# Patient Record
Sex: Female | Born: 1945
Health system: Southern US, Community
[De-identification: ages and names within clinical notes are randomized; demographics above are authoritative.]

## PROBLEM LIST (undated history)

## (undated) DIAGNOSIS — I2510 Atherosclerotic heart disease of native coronary artery without angina pectoris: Secondary | ICD-10-CM

## (undated) DIAGNOSIS — I358 Other nonrheumatic aortic valve disorders: Secondary | ICD-10-CM

## (undated) DIAGNOSIS — I071 Rheumatic tricuspid insufficiency: Secondary | ICD-10-CM

## (undated) DIAGNOSIS — Z950 Presence of cardiac pacemaker: Secondary | ICD-10-CM

## (undated) DIAGNOSIS — H269 Unspecified cataract: Secondary | ICD-10-CM

## (undated) DIAGNOSIS — R0602 Shortness of breath: Secondary | ICD-10-CM

## (undated) DIAGNOSIS — I441 Atrioventricular block, second degree: Secondary | ICD-10-CM

## (undated) DIAGNOSIS — E039 Hypothyroidism, unspecified: Secondary | ICD-10-CM

## (undated) DIAGNOSIS — E785 Hyperlipidemia, unspecified: Secondary | ICD-10-CM

## (undated) DIAGNOSIS — Z5189 Encounter for other specified aftercare: Secondary | ICD-10-CM

## (undated) DIAGNOSIS — I351 Nonrheumatic aortic (valve) insufficiency: Secondary | ICD-10-CM

## (undated) DIAGNOSIS — J40 Bronchitis, not specified as acute or chronic: Secondary | ICD-10-CM

## (undated) DIAGNOSIS — M8580 Other specified disorders of bone density and structure, unspecified site: Secondary | ICD-10-CM

## (undated) HISTORY — DX: Shortness of breath: R06.02

## (undated) HISTORY — DX: Atrioventricular block, second degree: I44.1

## (undated) HISTORY — DX: Atherosclerotic heart disease of native coronary artery without angina pectoris: I25.10

## (undated) HISTORY — PX: PACEMAKER INSERTION: SHX728

## (undated) HISTORY — DX: Unspecified cataract: H26.9

## (undated) HISTORY — DX: Rheumatic tricuspid insufficiency: I07.1

## (undated) HISTORY — DX: Nonrheumatic aortic (valve) insufficiency: I35.1

## (undated) HISTORY — DX: Hyperlipidemia, unspecified: E78.5

## (undated) HISTORY — DX: Bronchitis, not specified as acute or chronic: J40

## (undated) HISTORY — DX: Other specified disorders of bone density and structure, unspecified site: M85.80

## (undated) HISTORY — DX: Presence of cardiac pacemaker: Z95.0

## (undated) HISTORY — PX: MYOMECTOMY: SHX85

## (undated) HISTORY — DX: Encounter for other specified aftercare: Z51.89

## (undated) HISTORY — DX: Hypothyroidism, unspecified: E03.9

## (undated) HISTORY — DX: Other nonrheumatic aortic valve disorders: I35.8

## (undated) HISTORY — PX: CARDIAC CATHETERIZATION: SHX172

## (undated) HISTORY — PX: TOTAL ABDOMINAL HYSTERECTOMY W/ BILATERAL SALPINGOOPHORECTOMY: SHX83

---

## 1998-10-18 ENCOUNTER — Other Ambulatory Visit: Admission: RE | Admit: 1998-10-18 | Discharge: 1998-10-18 | Payer: Self-pay | Admitting: Obstetrics and Gynecology

## 1999-11-07 ENCOUNTER — Encounter: Admission: RE | Admit: 1999-11-07 | Discharge: 1999-11-07 | Payer: Self-pay | Admitting: Obstetrics and Gynecology

## 1999-11-07 ENCOUNTER — Encounter: Payer: Self-pay | Admitting: Obstetrics and Gynecology

## 2000-03-06 ENCOUNTER — Other Ambulatory Visit: Admission: RE | Admit: 2000-03-06 | Discharge: 2000-03-06 | Payer: Self-pay | Admitting: Obstetrics and Gynecology

## 2000-09-03 ENCOUNTER — Encounter: Payer: Self-pay | Admitting: Family Medicine

## 2000-09-03 ENCOUNTER — Encounter: Admission: RE | Admit: 2000-09-03 | Discharge: 2000-09-03 | Payer: Self-pay | Admitting: Family Medicine

## 2000-11-13 ENCOUNTER — Encounter: Admission: RE | Admit: 2000-11-13 | Discharge: 2000-11-13 | Payer: Self-pay | Admitting: Family Medicine

## 2000-11-13 ENCOUNTER — Encounter: Payer: Self-pay | Admitting: Family Medicine

## 2001-03-20 ENCOUNTER — Other Ambulatory Visit: Admission: RE | Admit: 2001-03-20 | Discharge: 2001-03-20 | Payer: Self-pay | Admitting: Obstetrics and Gynecology

## 2001-04-25 ENCOUNTER — Encounter: Payer: Self-pay | Admitting: Family Medicine

## 2001-04-25 ENCOUNTER — Encounter: Admission: RE | Admit: 2001-04-25 | Discharge: 2001-04-25 | Payer: Self-pay | Admitting: Family Medicine

## 2001-04-29 ENCOUNTER — Encounter: Payer: Self-pay | Admitting: Family Medicine

## 2001-04-29 ENCOUNTER — Encounter: Admission: RE | Admit: 2001-04-29 | Discharge: 2001-04-29 | Payer: Self-pay | Admitting: Family Medicine

## 2001-08-01 ENCOUNTER — Encounter: Admission: RE | Admit: 2001-08-01 | Discharge: 2001-08-01 | Payer: Self-pay | Admitting: Family Medicine

## 2001-08-01 ENCOUNTER — Encounter: Payer: Self-pay | Admitting: Family Medicine

## 2001-08-27 ENCOUNTER — Ambulatory Visit (HOSPITAL_COMMUNITY): Admission: RE | Admit: 2001-08-27 | Discharge: 2001-08-28 | Payer: Self-pay | Admitting: Cardiology

## 2001-09-06 ENCOUNTER — Encounter: Admission: RE | Admit: 2001-09-06 | Discharge: 2001-12-05 | Payer: Self-pay | Admitting: Cardiology

## 2001-09-10 ENCOUNTER — Encounter (HOSPITAL_COMMUNITY): Admission: RE | Admit: 2001-09-10 | Discharge: 2001-10-25 | Payer: Self-pay | Admitting: Cardiology

## 2001-11-14 ENCOUNTER — Encounter: Admission: RE | Admit: 2001-11-14 | Discharge: 2001-11-14 | Payer: Self-pay | Admitting: Family Medicine

## 2001-11-14 ENCOUNTER — Encounter: Payer: Self-pay | Admitting: Family Medicine

## 2002-01-02 ENCOUNTER — Encounter (INDEPENDENT_AMBULATORY_CARE_PROVIDER_SITE_OTHER): Payer: Self-pay | Admitting: Specialist

## 2002-01-02 ENCOUNTER — Ambulatory Visit (HOSPITAL_COMMUNITY): Admission: RE | Admit: 2002-01-02 | Discharge: 2002-01-02 | Payer: Self-pay | Admitting: Gastroenterology

## 2002-02-03 ENCOUNTER — Encounter: Admission: RE | Admit: 2002-02-03 | Discharge: 2002-05-04 | Payer: Self-pay | Admitting: Cardiology

## 2003-03-24 ENCOUNTER — Ambulatory Visit (HOSPITAL_COMMUNITY): Admission: RE | Admit: 2003-03-24 | Discharge: 2003-03-25 | Payer: Self-pay | Admitting: Cardiology

## 2003-03-24 ENCOUNTER — Encounter: Payer: Self-pay | Admitting: Cardiology

## 2003-03-25 ENCOUNTER — Encounter: Payer: Self-pay | Admitting: Cardiology

## 2004-07-19 ENCOUNTER — Other Ambulatory Visit: Admission: RE | Admit: 2004-07-19 | Discharge: 2004-07-19 | Payer: Self-pay | Admitting: Obstetrics and Gynecology

## 2004-08-16 ENCOUNTER — Ambulatory Visit: Payer: Self-pay

## 2004-11-16 ENCOUNTER — Ambulatory Visit: Payer: Self-pay | Admitting: Cardiology

## 2004-12-02 ENCOUNTER — Ambulatory Visit: Payer: Self-pay | Admitting: Cardiology

## 2004-12-02 ENCOUNTER — Ambulatory Visit: Payer: Self-pay

## 2005-06-01 ENCOUNTER — Ambulatory Visit: Payer: Self-pay | Admitting: Cardiology

## 2005-08-30 ENCOUNTER — Ambulatory Visit: Payer: Self-pay | Admitting: Cardiology

## 2005-11-23 ENCOUNTER — Other Ambulatory Visit: Admission: RE | Admit: 2005-11-23 | Discharge: 2005-11-23 | Payer: Self-pay | Admitting: Obstetrics and Gynecology

## 2005-11-27 ENCOUNTER — Ambulatory Visit: Payer: Self-pay | Admitting: Cardiology

## 2006-02-28 ENCOUNTER — Ambulatory Visit: Payer: Self-pay | Admitting: Cardiology

## 2006-06-11 ENCOUNTER — Ambulatory Visit: Payer: Self-pay | Admitting: Cardiology

## 2006-09-05 ENCOUNTER — Ambulatory Visit: Payer: Self-pay | Admitting: Cardiology

## 2006-12-04 ENCOUNTER — Ambulatory Visit: Payer: Self-pay | Admitting: Cardiology

## 2007-04-30 ENCOUNTER — Ambulatory Visit: Payer: Self-pay | Admitting: Internal Medicine

## 2007-07-31 ENCOUNTER — Ambulatory Visit: Payer: Self-pay | Admitting: Cardiology

## 2007-11-27 ENCOUNTER — Ambulatory Visit: Payer: Self-pay | Admitting: Cardiology

## 2008-02-26 ENCOUNTER — Ambulatory Visit: Payer: Self-pay | Admitting: Cardiology

## 2008-05-28 ENCOUNTER — Ambulatory Visit: Payer: Self-pay | Admitting: Cardiology

## 2008-08-03 ENCOUNTER — Ambulatory Visit: Payer: Self-pay | Admitting: Internal Medicine

## 2008-08-03 DIAGNOSIS — E785 Hyperlipidemia, unspecified: Secondary | ICD-10-CM

## 2008-08-03 DIAGNOSIS — I2510 Atherosclerotic heart disease of native coronary artery without angina pectoris: Secondary | ICD-10-CM

## 2008-08-03 DIAGNOSIS — E039 Hypothyroidism, unspecified: Secondary | ICD-10-CM

## 2008-08-06 ENCOUNTER — Ambulatory Visit: Payer: Self-pay | Admitting: Obstetrics and Gynecology

## 2008-08-06 ENCOUNTER — Encounter: Payer: Self-pay | Admitting: Obstetrics and Gynecology

## 2008-08-06 ENCOUNTER — Other Ambulatory Visit: Admission: RE | Admit: 2008-08-06 | Discharge: 2008-08-06 | Payer: Self-pay | Admitting: Obstetrics and Gynecology

## 2008-08-26 ENCOUNTER — Ambulatory Visit: Payer: Self-pay | Admitting: Cardiology

## 2008-10-07 ENCOUNTER — Ambulatory Visit: Payer: Self-pay | Admitting: Internal Medicine

## 2008-10-07 LAB — CONVERTED CEMR LAB
Albumin: 4.3 g/dL (ref 3.5–5.2)
Alkaline Phosphatase: 73 units/L (ref 39–117)
BUN: 16 mg/dL (ref 6–23)
Bilirubin, Direct: 0.1 mg/dL (ref 0.0–0.3)
Chloride: 105 meq/L (ref 96–112)
GFR calc non Af Amer: 77.19 mL/min (ref >60)
Glucose, Bld: 88 mg/dL (ref 70–99)
Potassium: 4.1 meq/L (ref 3.5–5.1)
TSH: 2.34 microintl units/mL (ref 0.35–5.50)
Total Bilirubin: 1.2 mg/dL (ref 0.3–1.2)
Total CHOL/HDL Ratio: 3
Vit D, 1,25-Dihydroxy: 41 (ref 30–89)

## 2008-10-11 ENCOUNTER — Encounter: Payer: Self-pay | Admitting: Internal Medicine

## 2008-10-12 ENCOUNTER — Ambulatory Visit (HOSPITAL_BASED_OUTPATIENT_CLINIC_OR_DEPARTMENT_OTHER): Admission: RE | Admit: 2008-10-12 | Discharge: 2008-10-12 | Payer: Self-pay | Admitting: Internal Medicine

## 2008-10-12 ENCOUNTER — Ambulatory Visit: Payer: Self-pay | Admitting: Diagnostic Radiology

## 2008-10-12 ENCOUNTER — Ambulatory Visit: Payer: Self-pay | Admitting: Internal Medicine

## 2008-10-12 ENCOUNTER — Telehealth: Payer: Self-pay | Admitting: Internal Medicine

## 2008-10-12 DIAGNOSIS — R509 Fever, unspecified: Secondary | ICD-10-CM

## 2008-10-12 LAB — CONVERTED CEMR LAB
Basophils Absolute: 0 10*3/uL (ref 0.0–0.1)
Basophils Relative: 0.2 % (ref 0.0–3.0)
Eosinophils Absolute: 0.1 10*3/uL (ref 0.0–0.7)
HCT: 41 % (ref 36.0–46.0)
Hemoglobin: 14 g/dL (ref 12.0–15.0)
Lymphocytes Relative: 7.1 % — ABNORMAL LOW (ref 12.0–46.0)
Lymphs Abs: 0.3 10*3/uL — ABNORMAL LOW (ref 0.7–4.0)
MCHC: 34.2 g/dL (ref 30.0–36.0)
MCV: 88.4 fL (ref 78.0–100.0)
Monocytes Absolute: 0.3 10*3/uL (ref 0.1–1.0)
Neutrophils Relative %: 84.5 % — ABNORMAL HIGH (ref 43.0–77.0)
RBC: 4.64 M/uL (ref 3.87–5.11)
RDW: 12.5 % (ref 11.5–14.6)
WBC: 4.1 10*3/uL — ABNORMAL LOW (ref 4.5–10.5)

## 2008-10-13 ENCOUNTER — Encounter: Payer: Self-pay | Admitting: Internal Medicine

## 2008-10-13 LAB — CONVERTED CEMR LAB: LDH: 446 units/L — ABNORMAL HIGH (ref 94–250)

## 2008-10-28 ENCOUNTER — Ambulatory Visit: Payer: Self-pay | Admitting: Internal Medicine

## 2008-10-28 LAB — CONVERTED CEMR LAB
Basophils Absolute: 0.1 10*3/uL (ref 0.0–0.1)
Basophils Relative: 1 % (ref 0–1)
Eosinophils Absolute: 0.2 10*3/uL (ref 0.0–0.7)
Eosinophils Relative: 4 % (ref 0–5)
LDH: 216 units/L (ref 94–250)
MCV: 88.4 fL (ref 78.0–100.0)
Monocytes Absolute: 0.6 10*3/uL (ref 0.1–1.0)
Neutro Abs: 3.4 10*3/uL (ref 1.7–7.7)
Neutrophils Relative %: 60 % (ref 43–77)
WBC: 5.7 10*3/uL (ref 4.0–10.5)

## 2008-11-06 ENCOUNTER — Encounter (INDEPENDENT_AMBULATORY_CARE_PROVIDER_SITE_OTHER): Payer: Self-pay | Admitting: Radiology

## 2008-12-01 DIAGNOSIS — Z950 Presence of cardiac pacemaker: Secondary | ICD-10-CM | POA: Insufficient documentation

## 2008-12-02 ENCOUNTER — Ambulatory Visit: Payer: Self-pay | Admitting: Cardiology

## 2008-12-24 ENCOUNTER — Encounter: Payer: Self-pay | Admitting: Cardiology

## 2009-03-03 ENCOUNTER — Encounter: Payer: Self-pay | Admitting: Internal Medicine

## 2009-03-03 ENCOUNTER — Ambulatory Visit: Payer: Self-pay | Admitting: Cardiology

## 2009-03-04 ENCOUNTER — Encounter: Payer: Self-pay | Admitting: Internal Medicine

## 2009-03-08 ENCOUNTER — Encounter: Payer: Self-pay | Admitting: Cardiology

## 2009-03-31 ENCOUNTER — Encounter: Payer: Self-pay | Admitting: Internal Medicine

## 2009-04-01 ENCOUNTER — Encounter: Payer: Self-pay | Admitting: Internal Medicine

## 2009-05-13 ENCOUNTER — Telehealth: Payer: Self-pay | Admitting: Cardiology

## 2009-06-08 ENCOUNTER — Encounter: Payer: Self-pay | Admitting: Cardiology

## 2009-06-30 ENCOUNTER — Ambulatory Visit: Payer: Self-pay | Admitting: Cardiology

## 2009-08-25 ENCOUNTER — Encounter: Payer: Self-pay | Admitting: Cardiology

## 2009-10-01 ENCOUNTER — Encounter: Payer: Self-pay | Admitting: Cardiology

## 2009-10-12 ENCOUNTER — Ambulatory Visit: Payer: Self-pay | Admitting: Cardiology

## 2009-11-02 ENCOUNTER — Encounter: Payer: Self-pay | Admitting: Cardiology

## 2009-11-15 ENCOUNTER — Ambulatory Visit: Payer: Self-pay | Admitting: Cardiology

## 2009-11-16 ENCOUNTER — Telehealth: Payer: Self-pay | Admitting: Cardiology

## 2009-12-09 ENCOUNTER — Telehealth: Payer: Self-pay | Admitting: Cardiology

## 2009-12-15 ENCOUNTER — Ambulatory Visit: Payer: Self-pay | Admitting: Cardiology

## 2009-12-15 ENCOUNTER — Encounter: Payer: Self-pay | Admitting: Cardiology

## 2009-12-15 ENCOUNTER — Ambulatory Visit: Payer: Self-pay

## 2009-12-15 ENCOUNTER — Ambulatory Visit (HOSPITAL_COMMUNITY): Admission: RE | Admit: 2009-12-15 | Discharge: 2009-12-15 | Payer: Self-pay

## 2009-12-15 DIAGNOSIS — R0602 Shortness of breath: Secondary | ICD-10-CM | POA: Insufficient documentation

## 2009-12-16 ENCOUNTER — Encounter: Payer: Self-pay | Admitting: Cardiology

## 2009-12-17 ENCOUNTER — Ambulatory Visit (HOSPITAL_COMMUNITY): Admission: RE | Admit: 2009-12-17 | Discharge: 2009-12-17 | Payer: Self-pay | Admitting: Cardiology

## 2009-12-17 ENCOUNTER — Ambulatory Visit: Payer: Self-pay | Admitting: Cardiology

## 2009-12-17 IMAGING — CR DG CHEST 2V
2 series · 2 of 2 positions shown · non-contrast
Comparison: [DATE]

CLINICAL DATA: Shortness of breath.  Coronary artery disease.

CHEST - 2 VIEW

[view not recorded (1 of 2)]
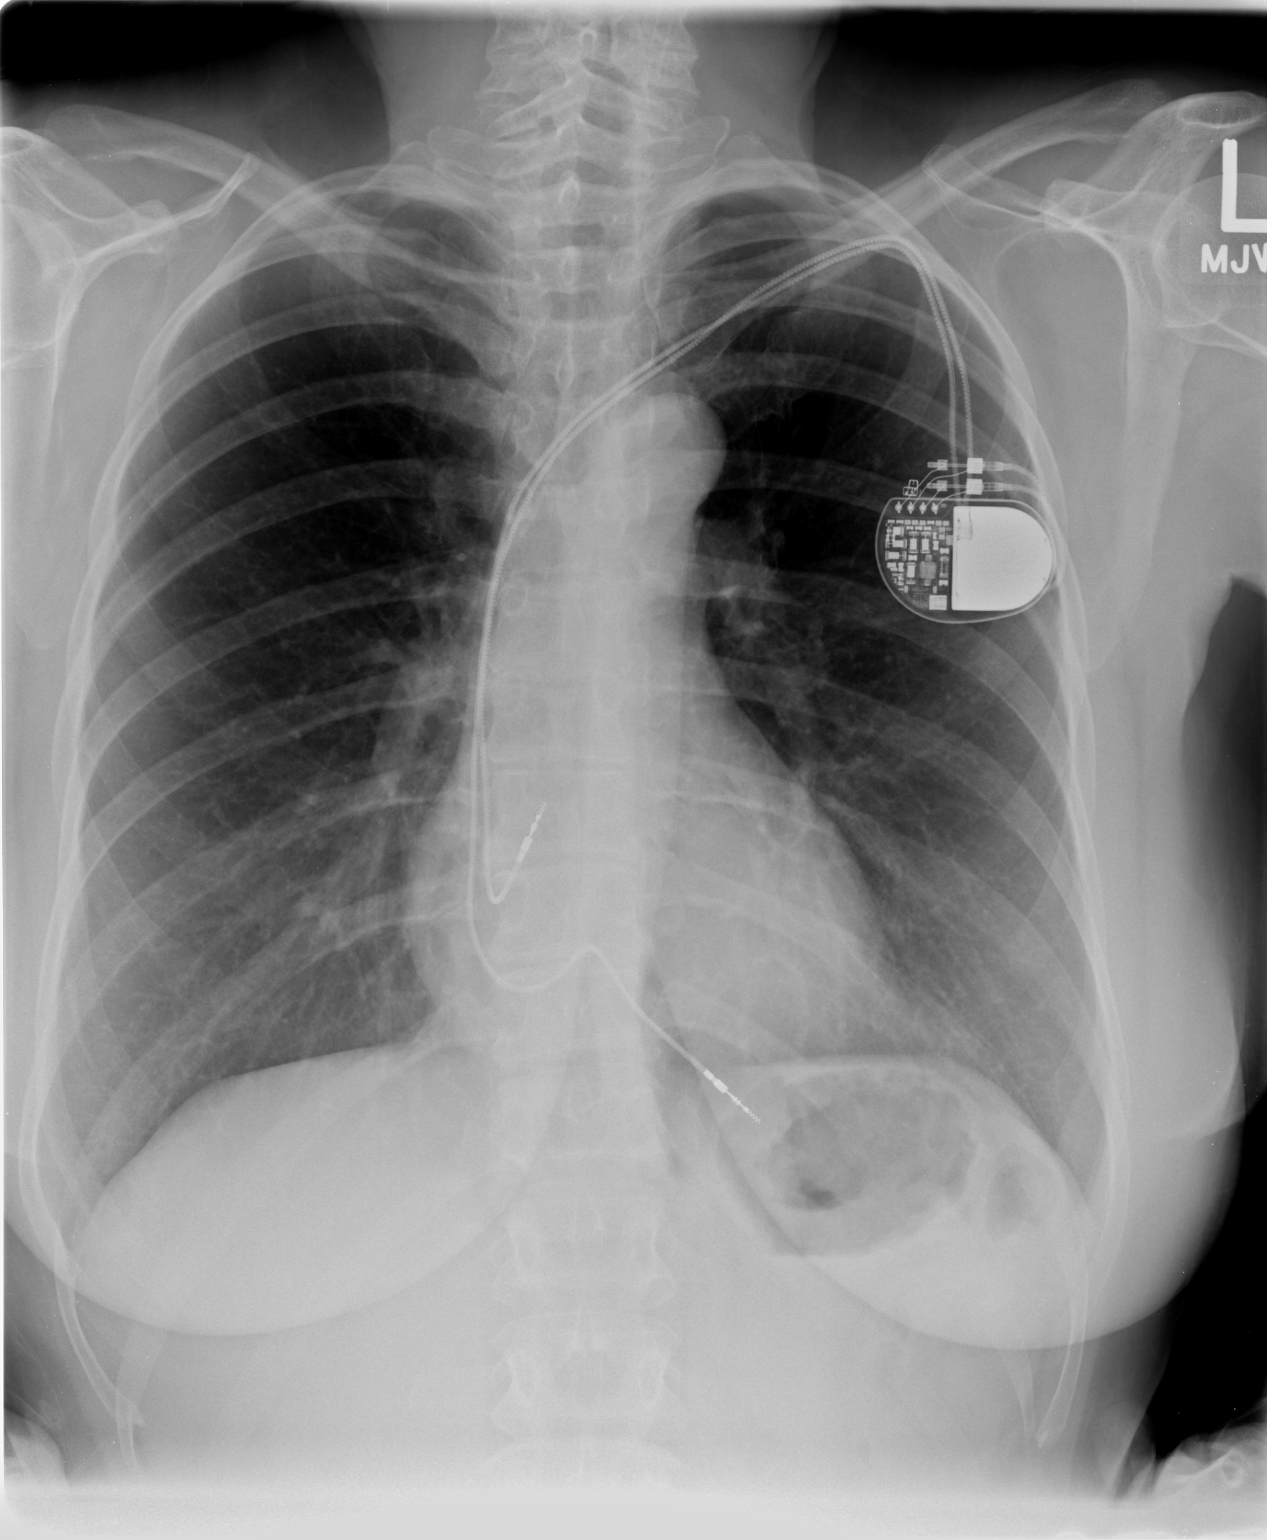

[view not recorded (2 of 2)]
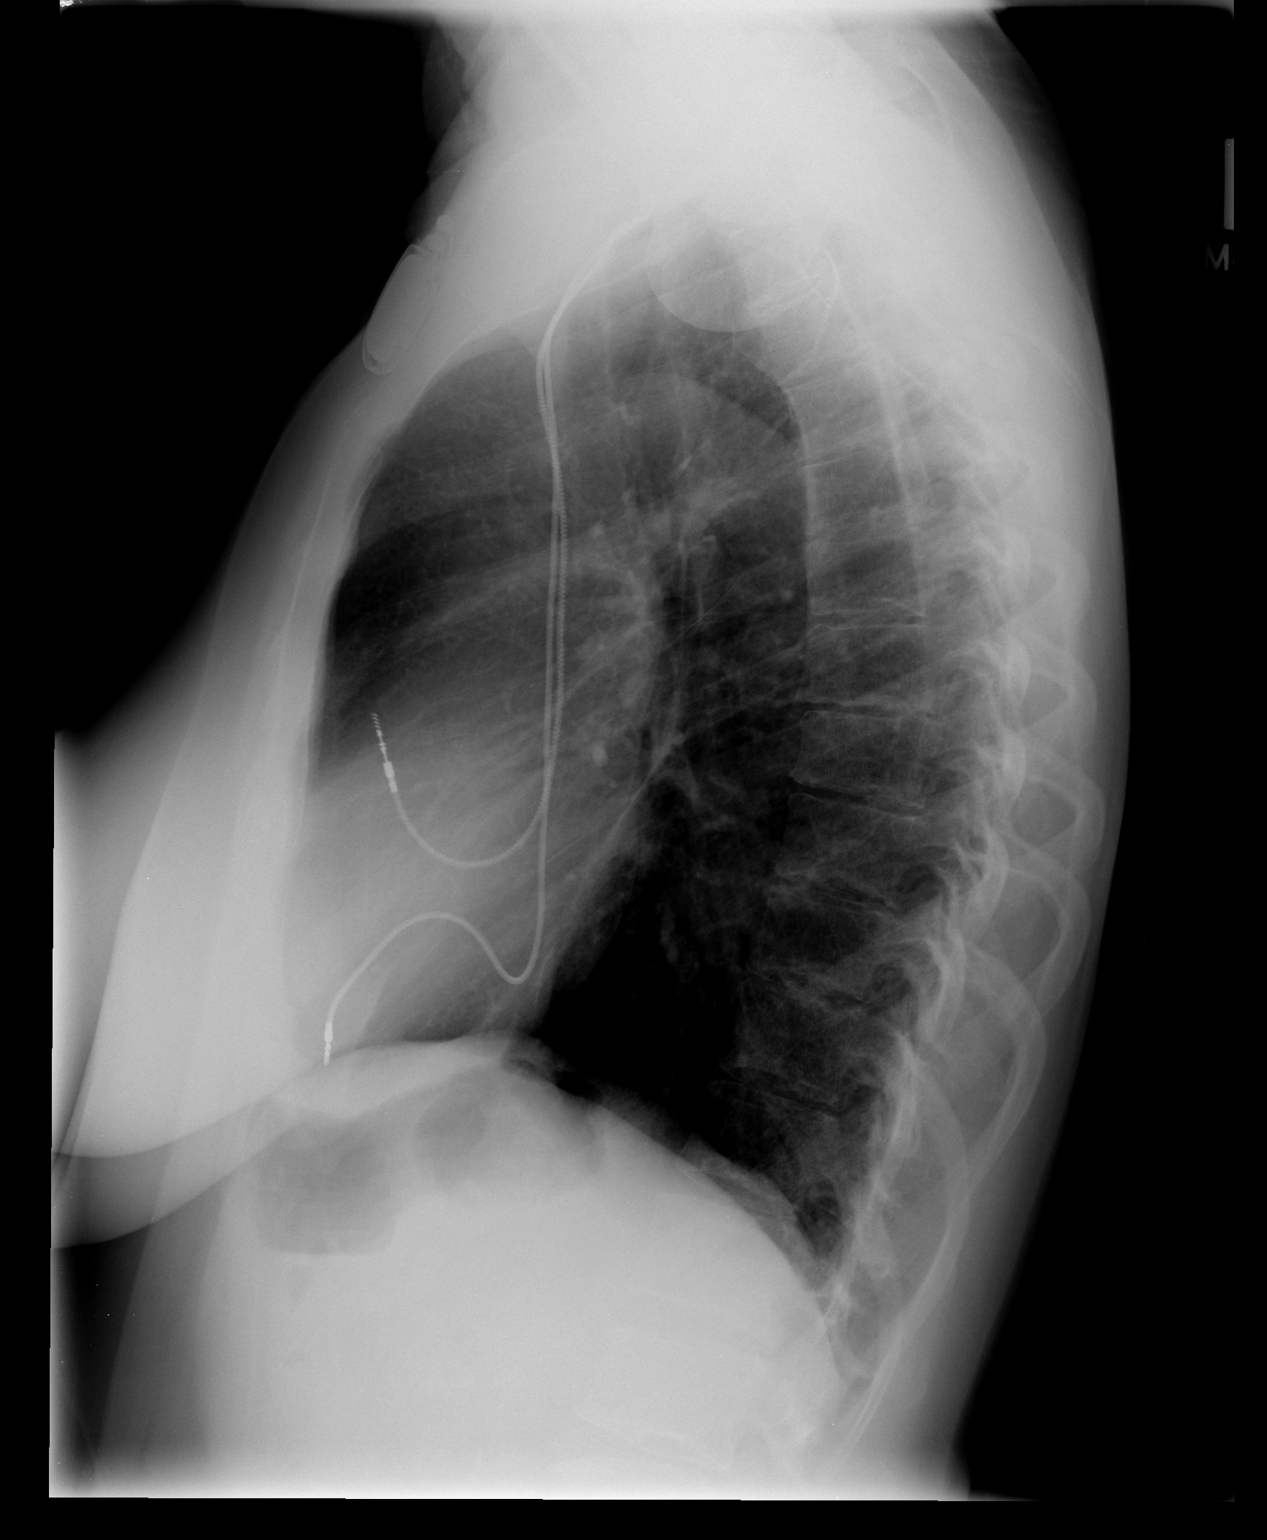

[2 of 2 positions shown; findings below may reference images not displayed]

FINDINGS: Dual lead pacer is in place, unchanged.  Heart size and
vascularity are normal and the lungs are clear.  No bony
abnormality.
IMPRESSION: No acute disease.  No change since the prior exam.

## 2009-12-23 ENCOUNTER — Ambulatory Visit: Payer: Self-pay | Admitting: Cardiology

## 2010-02-21 ENCOUNTER — Ambulatory Visit: Payer: Self-pay | Admitting: Cardiology

## 2010-04-15 ENCOUNTER — Telehealth (INDEPENDENT_AMBULATORY_CARE_PROVIDER_SITE_OTHER): Payer: Self-pay | Admitting: *Deleted

## 2010-04-15 ENCOUNTER — Encounter: Payer: Self-pay | Admitting: Cardiology

## 2010-04-27 ENCOUNTER — Encounter (INDEPENDENT_AMBULATORY_CARE_PROVIDER_SITE_OTHER): Payer: Self-pay | Admitting: *Deleted

## 2010-05-13 ENCOUNTER — Encounter (INDEPENDENT_AMBULATORY_CARE_PROVIDER_SITE_OTHER): Payer: Self-pay | Admitting: *Deleted

## 2010-07-13 ENCOUNTER — Encounter: Payer: Self-pay | Admitting: Cardiology

## 2010-07-21 ENCOUNTER — Ambulatory Visit
Admission: RE | Admit: 2010-07-21 | Discharge: 2010-07-21 | Payer: Self-pay | Source: Home / Self Care | Attending: Cardiology | Admitting: Cardiology

## 2010-07-25 ENCOUNTER — Encounter: Payer: Self-pay | Admitting: Internal Medicine

## 2010-08-01 ENCOUNTER — Encounter (INDEPENDENT_AMBULATORY_CARE_PROVIDER_SITE_OTHER): Payer: Self-pay | Admitting: *Deleted

## 2010-08-06 ENCOUNTER — Encounter: Payer: Self-pay | Admitting: Family Medicine

## 2010-08-16 NOTE — Assessment & Plan Note (Signed)
Summary: exercise intolerance   Visit Type:  Follow-up Primary Provider:  Dondra Spry DO  CC:  exercise intolerance.  History of Present Illness: Madeline Schaefer is seen as an add on patient today.  She is followed by Dr. Juanda Chance.  She has had rapidly declining exercise tolerance.  This has been rather marked, and not too disimilar from what she had in the past.  Her pacer was recently adjusted, and today she was noted to have pacing, even with AV delay.   Nonetheless, her friend, who is a primary care doctor in [place]  called me, and she does activities with her.  She says this is a noticeable, and remarkable change in her functional status.  She thought she needed further evaluation, and insisted appropriately we see her as an add on today during a pacer check.  The patient notes a recent progressive decline in functional status, to nearly class III, and EF by echo today was noted to be reduced, possible due to pacing, but hard to tell. WMA is also in territory of prior stent.  Denies rest chest symptoms.  Denies significant radiation, with dyspnea as the primary event.   Current Medications (verified): 1)  Lipitor 80 Mg Tabs (Atorvastatin Calcium) .... Take 1/2 Tablet By Mouth Once A Day 2)  Levoxyl 100 Mcg Tabs (Levothyroxine Sodium) .Marland Kitchen.. 1 Tab Once Daily 3)  Fish Oil 1000 Mg Caps (Omega-3 Fatty Acids) .... Take 1 Tablet By Mouth Four Times A Day 4)  Aspirin Low Dose 81 Mg Tabs (Aspirin) .... Take 1 Tablet By Mouth Once A Day 5)  Calcium 600/vitamin D 600-400 Mg-Unit Tabs (Calcium Carbonate-Vitamin D) .... Take 1/2  Tablet  Once Daily 6)  Multivitamins  Tabs (Multiple Vitamin) .... Take 1 Tablet By Mouth Once A Day 7)  Vitamin D3 1000 Unit Tabs (Cholecalciferol) .... Take 1 Tablet By Mouth Once A Day  Allergies: 1)  ! Codeine  Past History:  Past Medical History: Last updated: 12/01/2008 Coronary artery disease  S/P bare metal stent to LAD 2003 - Dr. Charlies Constable Hyperlipidemia Hypothyroidism Second degree AV block 1. Coronary artery status post placement of a bare metal stent to the     LAD in 2003, now stable. 2. Status post Medtronic Kappa DDD pacemaker implantation for second-     degree AV block in 2004, now stable with good pacer function. 3. Hyperlipidemia. 4. Treated hypothyroidism.   Past Surgical History: Last updated: 10/31/2008 Pacemaker DDD 2004 Colonoscopy 2003  Hysterectomy and BSO - 27 years ago Hx of myomectomy   Family History: Last updated: 10/31/2008 Prostate ca - father MI - mother age 90  Social History: Last updated: 10/31/2008 Married - 29 years no children Retired - sold life ins and mutual funds Never Smoked   Risk Factors: Alcohol Use: 1 (10/28/2008) Caffeine Use: yes (10/28/2008) Exercise: yes (10/28/2008)  Risk Factors: Smoking Status: never (10/28/2008)  Review of Systems       The patient complains of dyspnea on exertion.  The patient denies anorexia, fever, weight loss, weight gain, vision loss, decreased hearing, hoarseness, chest pain, headaches, hemoptysis, abdominal pain, melena, hematochezia, and severe indigestion/heartburn.    Vital Signs:  Patient profile:   65 year old female Height:      70 inches Weight:      166.50 pounds BMI:     23.98 Pulse rate:   67 / minute Pulse rhythm:   regular Resp:     18 per minute BP sitting:  140 / 80  (left arm) Cuff size:   large  Vitals Entered By: Vikki Ports (December 15, 2009 2:33 PM)  Physical Exam  General:  Well developed, well nourished, in no acute distress. Head:  normocephalic and atraumatic Eyes:  PERRLA/EOM intact; conjunctiva and lids normal. Lungs:  Clear bilaterally to auscultation and percussion. Heart:  PMI non displaced.  Paradoxical splitting of S2.  No definite murmur.  Pulses:  pulses normal in all 4 extremities Extremities:  No clubbing or cyanosis. Neurologic:  Alert and oriented x  3.   Echocardiogram  Procedure date:  12/15/2009  Findings:      Study Conclusions            - Left ventricle: The EF is 40-45%. There is mild global       hypokinesis. There is moderate hypokinesis of the apex. Some of       the motion abnormalities may be related to the paced rhythm. The       cavity size was normal. Wall thickness was normal. Doppler       parameters are consistent with abnormal left ventricular       relaxation (grade 1 diastolic dysfunction).     - Aortic valve: Mild regurgitation.     - Right ventricle: The cavity size was mildly dilated. Pacer wire or       catheter noted in right ventricle. Systolic function was mildly       reduced.     - Right atrium: The atrium was moderately dilated.     - Tricuspid valve: Moderate regurgitation.  Cardiac Cath  Procedure date:  08/27/2001  Findings:      RESULTS:  1. The left main coronary artery:  The left main coronary artery was free of    significant disease. 2. The left anterior descending artery:  The left anterior    descending artery gave rise to a large diagonal branch, a septal    perforator and a small diagonal branch. There was 80% narrowing in the    mid LAD after the first septal perforator. 3. The circumflex artery:  The circumflex artery gave rise to an intermediate    branch and two posterolateral branches. These vessels were free of    significant disease. 4. The right coronary artery:  The right coronary artery is a moderate    sized vessel that gave rise to a conus branch, right ventricular branch,    a posterior descending branch, and two posterolateral branches. These    vessels were free of significant disease.  LEFT VENTRICULOGRAM: The left ventriculogram performed in the RAO projection showed good wall motion with no areas of hypokinesis. The estimated ejection fraction was 60%.  Following stenting of the lesion in the proximal LAD, the stenosis improved from 80% to 0%. There was  no dissection seen.  CONCLUSION: 1. Coronary artery disease with 80% narrowing in the proximal to the mid left    anterior descending artery, no major obstruction in the circumflex and    right coronary arteries, and normal left ventricular function. 2. Successful stenting of the proximal to mid left anterior descending artery    stenosis with improvement in percent diameter narrowing from 80% to 0%.   PPM Specifications Following MD:  Everardo Beals. Juanda Chance, MD     PPM Vendor:  Medtronic     PPM Model Number:  [id number]     PPM Serial Number:  [id number] PPM DOI:  03/24/2003  PPM Implanting MD:  Everardo Beals. Juanda Chance, MD  Lead 1    Location: RA     DOI: 03/24/2003     Model #: [id number]     Serial #: [id number]     Status: active Lead 2    Location: RV     DOI: 03/24/2003     Model #: [id number]     Serial #: [id number]     Status: active   Indications:  SECOND DEGREE HB   PPM Follow Up Pacer Dependent:  Yes      Parameters Mode:  DDD     Lower Rate Limit:  60     Upper Rate Limit:  150 Paced AV Delay:  150     Sensed AV Delay:  120  Impression & Recommendations:  Problem # 1:  DYSPNEA (ICD-786.05) Dyspnea is worrisome, and progressive, similar to symptoms of 2003.  EF is down in territory of LAD.  Possibly could be related to pacing, but patient thinks similar are similar to 2003.  Options discussed in detail   Favor repeat cath without nuclear imaging.  Risks, benefits, alternatives, discussed in detail.  She is agreeble to proceed.  Problem # 2:  PACEMAKER (ICD-V45.Marland Kitchen01) Adjusted today in clinic, with reshortening of AV delay, as she was paced without.   Problem # 3:  CORONARY ARTERY DISEASE (ICD-414.00) Prior stenting of LAD with Zeta (non DES).   Her updated medication list for this problem includes:    Aspirin Low Dose 81 Mg Tabs (Aspirin) .Marland Kitchen... Take 1 tablet by mouth once a day  Orders: EKG w/ Interpretation (93000) Cardiac Catheterization (Cardiac Cath)  Problem # 4:  HYPERLIPIDEMIA  (ICD-272.4) on agressive medical therapy.  Her updated medication list for this problem includes:    Lipitor 80 Mg Tabs (Atorvastatin calcium) .Marland Kitchen... Take 1/2 tablet by mouth once a day  Orders: EKG w/ Interpretation (93000) Cardiac Catheterization (Cardiac Cath)  Problem # 5:  HYPOTHYROIDISM (ICD-244.9) replacement treatment. Her updated medication list for this problem includes:    Levoxyl 100 Mcg Tabs (Levothyroxine sodium) .Marland Kitchen... 1 tab once daily  Patient Instructions: 1)  Your physician has requested that you have a cardiac catheterization.  Cardiac catheterization is used to diagnose and/or treat various heart conditions. Doctors may recommend this procedure for a number of different reasons. The most common reason is to evaluate chest pain. Chest pain can be a symptom of coronary artery disease (CAD), and cardiac catheterization can show whether plaque is narrowing or blocking your heart's arteries. This procedure is also used to evaluate the valves, as well as measure the blood flow and oxygen levels in different parts of your heart.  For further information please visit [email or web address]  Please follow instruction sheet, as given.

## 2010-08-16 NOTE — Letter (Signed)
Summary: Cardiac Catheterization Instructions- Main Lab  [facility], Main Office  [address]   [place], [place] [address]   Phone: [phone]  Fax: [phone]     12/16/2009 MRN: [id number]  [facility] [address] [place], [place]  [address]  Dear Ms. Longnecker,   You are scheduled for Cardiac Catheterization on Friday December 17, 2009              with Dr. Riley Kill.  Please arrive at the [facility] at 9:00      a.m. on the day of your procedure.  1. DIET     _X___ Nothing to eat or drink after midnight except your medications with a sip of water.  2. MAKE SURE YOU TAKE YOUR ASPIRIN.  3. _X___ YOU MAY TAKE ALL of your remaining medications with a small amount of water.   4. Plan for one night stay - bring personal belongings (i.e. toothpaste, toothbrush, etc.)  5. Bring a current list of your medications and current insurance cards.  6. Must have a responsible person to drive you home.   7. Someone must be with you for the first 24 hours after you arrive home.  8. Please wear clothes that are easy to get on and off and wear slip-on shoes.  *Special note: Every effort is made to have your procedure done on time.  Occasionally there are emergencies that present themselves at the hospital that may cause delays.  Please be patient if a delay does occur.  If you have any questions after you get home, please call the office at the number listed above.  Julieta Gutting, RN, BSN

## 2010-08-16 NOTE — Cardiovascular Report (Signed)
Summary: Cath/Percutaneous Orders  Cath/Percutaneous Orders   Imported By: Roderic Ovens 12/22/2009 15:24:21  _____________________________________________________________________  External Attachment:    Type:   Image     Comment:   External Document

## 2010-08-16 NOTE — Letter (Signed)
Summary: Remote Device Check  [facility], Main Office  [address]   [place], [place] [address]   Phone: [phone]  Fax: [phone]     April 27, 2010 MRN: [id number]   [facility] [address] [place], [place]  [address]   Dear Ms. Gault,   Your remote transmission was recieved and reviewed by your physician.  All diagnostics were within normal limits for you.  __X___Your next transmission is scheduled for:   07-21-2010.  Please transmit at any time this day.  If you have a wireless device your transmission will be sent automatically.   Sincerely,  Vella Kohler

## 2010-08-16 NOTE — Cardiovascular Report (Signed)
Summary: Office Visit Remote   Office Visit Remote   Imported By: Roderic Ovens 04/28/2010 10:58:44  _____________________________________________________________________  External Attachment:    Type:   Image     Comment:   External Document

## 2010-08-16 NOTE — Cardiovascular Report (Signed)
Summary: Office Visit   Office Visit   Imported By: Roderic Ovens 01/28/2010 16:03:32  _____________________________________________________________________  External Attachment:    Type:   Image     Comment:   External Document

## 2010-08-16 NOTE — Letter (Signed)
Summary: Remote Device Check  [facility], Main Office  [address]   [place], [place] [address]   Phone: [phone]  Fax: [phone]     August 25, 2009 MRN: [id number]   [facility] [address] [place], [place]  [address]   Dear Ms. Bozzo,   Your remote transmission was recieved and reviewed by your physician.  All diagnostics were within normal limits for you.  __X___Your next transmission is scheduled for:   September 29, 2009.  Please transmit at any time this day.  If you have a wireless device your transmission will be sent automatically.     Sincerely,  Proofreader

## 2010-08-16 NOTE — Progress Notes (Signed)
Summary: speak to dr/having trouble walking up hills  Phone Note Call from Patient Call back at [phone] or [phone]   Caller: Patient Reason for Call: Talk to Doctor Summary of Call: having trouble walking up hills, states her body is not acting right.... wants to speak to dr only Initial call taken by: Migdalia Dk,  Dec 09, 2009 2:38 PM  Follow-up for Phone Call        I called and spoke with the pt. I made her aware that Dr. Juanda Chance is out of the office through next week. She explained that she is still having trouble walking up some hills and doing her normal walking routine. She also occasionally feels flutterings to her chest. Her device was reprogrammed on 5/2 at her last office visit. I discussed this with Pension scheme manager. She explained symptoms may possibly be related to her device being reprogrammed. We will have the pt come in next week to have her device checked. We will have her f/u with Dr. Juanda Chance after that if her symptoms do not improve. The pt is agreeable. She will come on 12/15/09 for her device check at 1:45pm. Follow-up by: Sherri Rad, RN, BSN,  Dec 09, 2009 3:00 PM

## 2010-08-16 NOTE — Assessment & Plan Note (Signed)
Summary: 1 yr f/u   Visit Type:  Follow-up Primary Provider:  Dondra Spry DO  CC:  occ flutter.  History of Present Illness: The patient is 65 years old and return for followup management of CAD and her pacemaker. In 2003 she had a bare-metal stent placed to the LAD. In 2004 she had a DDD pacemaker for second-degree AV block. She done quite well since that time. She had no recent chest pain shortness breath or palpitations. Her other problems include hyperlipidemia.  [place] Her primary care is now with Dr. Leonia Corona with [facility].  She requested to see Dr. Tedra Senegal next year after I have retired. She realizes that he may only be here for a short time but she [place] her [place] to a need to change cardiologists at that time.  Current Medications (verified): 1)  Lipitor 80 Mg Tabs (Atorvastatin Calcium) .... Take 1/2 Tablet By Mouth Once A Day 2)  Levoxyl 100 Mcg Tabs (Levothyroxine Sodium) .Marland Kitchen.. 1 Tab Once Daily 3)  Fish Oil 1000 Mg Caps (Omega-3 Fatty Acids) .... Take 1 Tablet By Mouth Four Times A Day 4)  Aspirin Low Dose 81 Mg Tabs (Aspirin) .... Take 1 Tablet By Mouth Once A Day 5)  Calcium 600/vitamin D 600-400 Mg-Unit Tabs (Calcium Carbonate-Vitamin D) .... Take 1/2  Tablet  Once Daily 6)  Multivitamins  Tabs (Multiple Vitamin) .... Take 1 Tablet By Mouth Once A Day 7)  Vit D 3 .Marland Kitchen.. 1000iu Daily  Allergies (verified): 1)  ! Codeine  Past History:  Past Medical History: Reviewed history from 12/01/2008 and no changes required. Coronary artery disease  S/P bare metal stent to LAD 2003 - Dr. Charlies Constable Hyperlipidemia Hypothyroidism Second degree AV block 1. Coronary artery status post placement of a bare metal stent to the     LAD in 2003, now stable. 2. Status post Medtronic Kappa DDD pacemaker implantation for second-     degree AV block in 2004, now stable with  good pacer function. 3. Hyperlipidemia. 4. Treated hypothyroidism.   Review of Systems       ROS is negative except as outlined in HPI.   Vital Signs:  Patient profile:   65 year old female Height:      70 inches Weight:      166 pounds BMI:     23.90 Pulse rate:   73 / minute BP sitting:   117 / 76  (left arm) Cuff size:   large  Vitals Entered By: [name], CNA (Nov 15, 2009 3:09 PM)  Physical Exam  Additional Exam:  Gen. Well-nourished, in no distress   Neck: No JVD, thyroid not enlarged, no carotid bruits Lungs: No tachypnea, clear without rales, rhonchi or wheezes Cardiovascular: Rhythm regular, PMI not displaced,  heart sounds  normal, no murmurs or gallops, no peripheral edema, pulses normal in all 4 extremities. Abdomen: BS normal, abdomen soft and non-tender without masses or organomegaly, no hepatosplenomegaly. MS: No deformities, no cyanosis or clubbing   Neuro:  No focal sns   Skin:  no lesions    PPM Specifications Following MD:  Everardo Beals. Juanda Chance, MD     PPM Vendor:  Medtronic     PPM Model Number:  [id number]     PPM Serial Number:  [id number] PPM DOI:  03/24/2003     PPM Implanting MD:  Smitty Cords  R. Juanda Chance, MD  Lead 1    Location: RA     DOI: 03/24/2003     Model #: [id number]     Serial #: [id number]     Status: active Lead 2    Location: RV     DOI: 03/24/2003     Model #: [id number]     Serial #: [id number]     Status: active   Indications:  SECOND DEGREE HB   PPM Follow Up Pacer Dependent:  Yes      Parameters Mode:  DDD     Lower Rate Limit:  60     Upper Rate Limit:  150 Paced AV Delay:  150     Sensed AV Delay:  120  Impression & Recommendations:  Problem # 1:  CORONARY ARTERY DISEASE (ICD-414.00)  She had a bare-metal stent to the LAD in 2003. She has had no chest pain his problem appears stable. Her updated medication list for this problem includes:    Aspirin Low Dose 81 Mg Tabs (Aspirin) .Marland Kitchen... Take 1 tablet by mouth once a day  Orders: EKG w/  Interpretation (93[address])  Problem # 2:  PACEMAKER (ICD-V45.Marland Kitchen01) She had a Medtronic DDD pacemaker implanted in 2004 for second-degree AV block. On interrogation of her pacemaker today she conducts had an AV delay of 190. We reprogrammed her pacemaker so she will have less ventricular pacing.  Problem # 3:  HYPERLIPIDEMIA (ICD-272.4) Her last lipid profile last month was excellent with a total cholesterol of 131, an HDL of 44, and an LDL of 66. Her HDL is not quite at target I encouraged her to avoid the back carbohydrates. Her updated medication list for this problem includes:    Lipitor 80 Mg Tabs (Atorvastatin calcium) .Marland Kitchen... Take 1/2 tablet by mouth once a day  Patient Instructions: 1)  Your physician wants you to follow-up in: 1 year with Dr. Riley Kill.  You will receive a reminder letter in the mail two months in advance. If you don't receive a letter, please call our office to schedule the follow-up appointment. 2)  Your physician recommends that you continue on your current medications as directed. Please refer to the Current Medication list given to you today.

## 2010-08-16 NOTE — Cardiovascular Report (Signed)
Summary: Office Visit Remote   Office Visit Remote   Imported By: Roderic Ovens 08/27/2009 16:13:35  _____________________________________________________________________  External Attachment:    Type:   Image     Comment:   External Document

## 2010-08-16 NOTE — Assessment & Plan Note (Signed)
Summary: Madeline Schaefer   Primary Provider:  Dondra Spry DO   History of Present Illness: Patient is 65 years old and return for a followup management of CAD after her recent catheterization. She had a stent put in LAD in 2003 had a DDD pacemaker implanted for second degree AV block 2004. I saw her recently and she was doing well. We adjusted her pacemaker to prolonged AV delay so she would have intrinsic AV conduction more often. Following this she developed shortness of breath with climbing a steep hill and was concerned that maybe her stent had blocked off. She saw Dr. Selina Cooley and underwent catheterization and stent was widely patent. Her pacemaker was reprogrammed to normal AV delay.  She has done well since that time has had no recurrent symptoms she has minimal active.  Current Medications (verified): 1)  Lipitor 80 Mg Tabs (Atorvastatin Calcium) .... Take 1/2 Tablet By Mouth Once A Day 2)  Levoxyl 100 Mcg Tabs (Levothyroxine Sodium) .Marland Kitchen.. 1 Tab Once Daily 3)  Fish Oil 1000 Mg Caps (Omega-3 Fatty Acids) .... Take 1 Tablet By Mouth Four Times A Day 4)  Aspirin Low Dose 81 Mg Tabs (Aspirin) .... Take 1 Tablet By Mouth Once A Day 5)  Calcium 600/vitamin D 600-400 Mg-Unit Tabs (Calcium Carbonate-Vitamin D) .... Take 1/2  Tablet  Once Daily 6)  Multivitamins  Tabs (Multiple Vitamin) .... Take 1 Tablet By Mouth Once A Day 7)  Vitamin D3 1000 Unit Tabs (Cholecalciferol) .... Take 1 Tablet By Mouth Once A Day  Allergies (verified): 1)  ! Codeine 2)  ! Sulfa  Past History:  Past Medical History: Reviewed history from 12/01/2008 and no changes required. Coronary artery disease  S/P bare metal stent to LAD 2003 - Dr. Charlies Constable Hyperlipidemia Hypothyroidism Second degree AV block 1. Coronary artery status post placement of a bare metal stent to the     LAD in 2003, now stable. 2. Status post Medtronic Kappa DDD pacemaker implantation for second-     degree AV block in 2004, now stable with good  pacer function. 3. Hyperlipidemia. 4. Treated hypothyroidism.   Review of Systems       ROS is negative except as outlined in HPI.   Vital Signs:  Patient profile:   65 year old female Height:      70 inches Weight:      167 pounds BMI:     24.05 Pulse rate:   73 / minute Resp:     16 per minute BP sitting:   122 / 80  (left arm)  Vitals Entered By: Marrion Coy, CNA (December 23, 2009 1:57 PM)  Physical Exam  Additional Exam:  Gen. Well-nourished, in no distress   Neck: No JVD, thyroid not enlarged, no carotid bruits Lungs: No tachypnea, clear without rales, rhonchi or wheezes Cardiovascular: Rhythm regular, PMI not displaced,  heart sounds  normal, no murmurs or gallops, no peripheral edema, pulses normal in all 4 extremities. Abdomen: BS normal, abdomen soft and non-tender without masses or organomegaly, no hepatosplenomegaly. MS: No deformities, no cyanosis or clubbing   Neuro:  No focal sns   Skin:  no lesions  There was a small knot in the right femoral artery area   PPM Specifications Following MD:  Everardo Beals. Juanda Chance, MD     PPM Vendor:  Medtronic     PPM Model Number:  [id number]     PPM Serial Number:  [id number] PPM DOI:  03/24/2003  PPM Implanting MD:  Everardo Beals. Juanda Chance, MD  Lead 1    Location: RA     DOI: 03/24/2003     Model #: [id number]     Serial #: [id number]     Status: active Lead 2    Location: RV     DOI: 03/24/2003     Model #: [id number]     Serial #: [id number]     Status: active   Indications:  SECOND DEGREE HB   PPM Follow Up Pacer Dependent:  No      Parameters Mode:  DDD     Lower Rate Limit:  60     Upper Rate Limit:  150 Paced AV Delay:  150     Sensed AV Delay:  120  Impression & Recommendations:  Problem # 1:  CORONARY ARTERY DISEASE (ICD-414.00)  She had a stent placed to the LAD in 2003 which is a bare-metal stent. He develop shortness of breath recently and had catheterization which showed that the stent was widely patent. This prompted stable. Her  updated medication list for this problem includes:    Aspirin Low Dose 81 Mg Tabs (Aspirin) .Marland Kitchen... Take 1 tablet by mouth once a day  Orders: EKG w/ Interpretation (93000)  Problem # 2:  PACEMAKER (ICD-V45.Marland Kitchen01) She had a pacemaker inserted for AV block. We recently prolonged AV delay to take advantage of intrinsic conduction but she develop more shortness of breath and this was reprogrammed back to normal AV conduction. Her ejection fraction by echo was 40-45% probably related to his synergy. At her catheterization she had quite a bit of ventricular tachycardia but her ventricle a quite vigorous with normal wall motion. We will leave her pacemaker programmed to its current settings. I encouraged her to get back to regular activities but avoid heat and to get back into her conditioning program.  Problem # 3:  HYPERLIPIDEMIA (ICD-272.4)  This is managed with Lipitor. Her updated medication list for this problem includes:    Lipitor 80 Mg Tabs (Atorvastatin calcium) .Marland Kitchen... Take 1/2 tablet by mouth once a day  Her updated medication list for this problem includes:    Lipitor 80 Mg Tabs (Atorvastatin calcium) .Marland Kitchen... Take 1/2 tablet by mouth once a day  Patient Instructions: 1)  Your physician recommends that you continue on your current medications as directed. Please refer to the Current Medication list given to you today. 2)  Your physician wants you to follow-up in: 4 months.  You will receive a reminder letter in the mail two months in advance. If you don't receive a letter, please call our office to schedule the follow-up appointment.

## 2010-08-16 NOTE — Letter (Signed)
Summary: Device-Delinquent Phone Journalist, newspaper, Main Office  [address]   [place], [place] [address]   Phone: [phone]  Fax: [phone]     October 01, 2009 MRN: [id number]   [facility] [address] [place], [place]  [address]   Dear Ms. Mossbarger,  According to our records, you were scheduled for a device phone transmission on   September 29, 2009.     We did not receive any results from this check.  If you transmitted on your scheduled day, please call us to help troubleshoot your system.  If you forgot to send your transmission, please send one upon receipt of this letter.  Thank you,   Architectural technologist [facility]

## 2010-08-16 NOTE — Progress Notes (Signed)
Summary: Clarify PCP   Phone Note Outgoing Call   Call placed by: Sherri Rad, RN, BSN,  Nov 16, 2009 12:45 PM Call placed to: Patient Summary of Call: I left a message for the pt to call so we can clarify which PCP she is seeing and we can fax a copy of her 5/2 note to them. Sherri Rad, RN, BSN  Nov 16, 2009 12:45 PM   Follow-up for Phone Call        pt returned call. please call her on cell [phone] [address]  Nov 16, 2009 1:01 PM  Additional Follow-up for Phone Call Additional follow up Details #1::        I called the pt. She wants [place] to send a copy of her note to Dr. Artist Pais and also to Dr. Laurence Aly at [facility] in [place], [place]. The pt also informed me that she was walking up a steep hill after kayaking with a friend recently and became SOB and had to stop 3 times. The pt states this was similar to when she was cathed and had a blockage that was found. [place] incl[place], but does not develop SOB on her daily walks. I explained to the pt that it is hard to say if her SOB that one time was related to walking up a steep incline vs. symptoms of a blockage. I have explained to her that since she has only had one episode, to observe her symptoms and if she starts to notice she is getting SOB on her daily walks, to let us know. The pt verbalizes understanding. Additional Follow-up by: Sherri Rad, RN, BSN,  Nov 24, 2009 11:53 AM

## 2010-08-16 NOTE — Progress Notes (Signed)
Summary: Failed Transmission  Phone Note Call from Patient   Caller: Patient Call For: [facility] Summary of Call: Hillard Danker called and asked if her transmission for August went through. It did not. She was asked to resend another. She stated she was worried about being out of town without knowing her devise was ok. We advised her just let us know if you have any problems. She left [place] her phone numbers just in case. [phone] homel    [phone] cell. SWilliams EMTP Initial call taken by: Milana Na, EMT-P,  April 15, 2010 4:50 PM

## 2010-08-16 NOTE — Cardiovascular Report (Signed)
Summary: Office Visit Remote   Office Visit Remote   Imported By: Roderic Ovens 01/31/2010 12:20:27  _____________________________________________________________________  External Attachment:    Type:   Image     Comment:   External Document

## 2010-08-16 NOTE — Miscellaneous (Signed)
Summary: dx correction  Clinical Lists Changes  Problems: Changed problem from PACEMAKER (ICD-V45..01) to PACEMAKER, PERMANENT (ICD-V45.01)  changed the incorrect dx code to correct dx code 

## 2010-08-16 NOTE — Procedures (Signed)
Summary: pc2/appt time is 1:45pm/hm   Current Medications (verified): 1)  Lipitor 80 Mg Tabs (Atorvastatin Calcium) .... Take 1/2 Tablet By Mouth Once A Day 2)  Levoxyl 100 Mcg Tabs (Levothyroxine Sodium) .Marland Kitchen.. 1 Tab Once Daily 3)  Fish Oil 1000 Mg Caps (Omega-3 Fatty Acids) .... Take 1 Tablet By Mouth Four Times A Day 4)  Aspirin Low Dose 81 Mg Tabs (Aspirin) .... Take 1 Tablet By Mouth Once A Day 5)  Calcium 600/vitamin D 600-400 Mg-Unit Tabs (Calcium Carbonate-Vitamin D) .... Take 1/2  Tablet  Once Daily 6)  Multivitamins  Tabs (Multiple Vitamin) .... Take 1 Tablet By Mouth Once A Day 7)  Vit D 3 .Marland Kitchen.. 1000iu Daily  Allergies (verified): 1)  ! Codeine  PPM Specifications Following MD:  Everardo Beals. Juanda Chance, MD     PPM Vendor:  Medtronic     PPM Model Number:  [id number]     PPM Serial Number:  [id number] PPM DOI:  03/24/2003     PPM Implanting MD:  Everardo Beals. Juanda Chance, MD  Lead 1    Location: RA     DOI: 03/24/2003     Model #: [id number]     Serial #: [id number]     Status: active Lead 2    Location: RV     DOI: 03/24/2003     Model #: [id number]     Serial #: [id number]     Status: active   Indications:  SECOND DEGREE HB   PPM Follow Up Remote Check?  No Battery Voltage:  2.73 V     Battery Est. Longevity:  2 YEARS     Pacer Dependent:  No       PPM Device Measurements Atrium  Amplitude: 2.8 mV, Impedance: 516 ohms, Threshold: 0.5 V at 0.4 msec Right Ventricle  Amplitude: 5.6 mV, Impedance: 663 ohms, Threshold: 0.75 V at 0.4 msec  Episodes MS Episodes:  0     Ventricular High Rate:  0     Atrial Pacing:  11%     Ventricular Pacing:  98.9%  Parameters Mode:  DDD     Lower Rate Limit:  60     Upper Rate Limit:  150 Paced AV Delay:  150     Sensed AV Delay:  120 Tech Comments:  Pt seen because of complaints of exercise intolerance.  At last check with Dr Juanda Chance, AV delays were changed to 250/220 to try to make patient conduct and minimize ventricular pacing.  Pt still pacing 98.9% with these AV delays.   AV delays reprogrammed today to 150/120 like they were previously to see if that would improve symtpoms.  No recent assessment of EF.  Dr Riley Kill to see patient today to evaluate. Gypsy Balsam RN BSN  December 15, 2009 2:17 PM

## 2010-08-18 NOTE — Cardiovascular Report (Signed)
Summary: Office Visit Remote   Office Visit Remote   Imported By: Roderic Ovens 07/14/2010 09:51:27  _____________________________________________________________________  External Attachment:    Type:   Image     Comment:   External Document

## 2010-08-18 NOTE — Cardiovascular Report (Signed)
Summary: Office Visit Remote   Office Visit Remote   Imported By: Roderic Ovens 08/05/2010 10:28:33  _____________________________________________________________________  External Attachment:    Type:   Image     Comment:   External Document

## 2010-08-18 NOTE — Letter (Signed)
Summary: Remote Device Check  [employer], Main Office  [address]   [place], [place] [address]   Phone: [phone]  Fax: [phone]     July 13, 2010 MRN: [id number]   [facility] [address] [place], [place]  [address]   Dear Ms. Icard,   Your remote transmission was recieved and reviewed by your physician.  All diagnostics were within normal limits for you.  __X___Your next transmission is scheduled for:  07-21-2010.  Please transmit at any time this day.  If you have a wireless device your transmission will be sent automatically.   Sincerely,  Vella Kohler

## 2010-08-18 NOTE — Letter (Signed)
Summary: Remote Device Check  [employer], Main Office  [address]   [place], [place] [address]   Phone: [phone]  Fax: [phone]     August 01, 2010 MRN: [id number]   [facility] [address] [place], [place]  [address]   Dear Ms. Salvia,   Your remote transmission was recieved and reviewed by your physician.  All diagnostics were within normal limits for you.  __X___Your next transmission is scheduled for:  10-27-2010.  Please transmit at any time this day.  If you have a wireless device your transmission will be sent automatically.   Sincerely,  Vella Kohler

## 2010-09-30 ENCOUNTER — Telehealth: Payer: Self-pay | Admitting: Family

## 2010-09-30 ENCOUNTER — Ambulatory Visit (HOSPITAL_BASED_OUTPATIENT_CLINIC_OR_DEPARTMENT_OTHER)
Admission: RE | Admit: 2010-09-30 | Discharge: 2010-09-30 | Disposition: A | Discharge status: Home / Self Care | Payer: BC Managed Care – PPO | Source: Ambulatory Visit | Attending: Internal Medicine | Admitting: Internal Medicine

## 2010-09-30 ENCOUNTER — Encounter: Payer: Self-pay | Admitting: Family

## 2010-09-30 ENCOUNTER — Other Ambulatory Visit: Payer: Self-pay | Admitting: Internal Medicine

## 2010-09-30 ENCOUNTER — Ambulatory Visit (INDEPENDENT_AMBULATORY_CARE_PROVIDER_SITE_OTHER): Payer: BC Managed Care – PPO | Admitting: Family

## 2010-09-30 DIAGNOSIS — R05 Cough: Secondary | ICD-10-CM | POA: Insufficient documentation

## 2010-09-30 DIAGNOSIS — J40 Bronchitis, not specified as acute or chronic: Secondary | ICD-10-CM

## 2010-09-30 DIAGNOSIS — R059 Cough, unspecified: Secondary | ICD-10-CM | POA: Insufficient documentation

## 2010-10-03 LAB — BASIC METABOLIC PANEL
BUN: 16 mg/dL (ref 6–23)
Calcium: 9.1 mg/dL (ref 8.4–10.5)
Chloride: 104 mEq/L (ref 96–112)
GFR calc non Af Amer: >60 mL/min (ref >60)
Glucose, Bld: 111 mg/dL — ABNORMAL HIGH (ref 70–99)
Potassium: 4.1 mEq/L (ref 3.5–5.1)
Sodium: 136 mEq/L (ref 135–145)

## 2010-10-03 LAB — PROTIME-INR: Prothrombin Time: 13.7 seconds (ref 11.6–15.2)

## 2010-10-03 LAB — CBC: MCV: 89.8 fL (ref 78.0–100.0)

## 2010-10-04 NOTE — Progress Notes (Signed)
  Phone Note Outgoing Call   Summary of Call: Called patient and let her know cxr is negative- no pneumonia.  I have sent rx to her pharmacy for ceftin to help with her sinuses and probable bronchitis.  She should call if symptoms worsen, or if no improvement in 48-72 hours. Initial call taken by: Lemont Fillers FNP,  September 30, 2010 1:39 PM

## 2010-10-07 ENCOUNTER — Encounter (INDEPENDENT_AMBULATORY_CARE_PROVIDER_SITE_OTHER): Payer: Self-pay | Admitting: *Deleted

## 2010-10-13 NOTE — Letter (Signed)
Summary: New Patient letter  [facility]  [address] [place], [place] [address]   Phone: [phone]  Fax: [phone]       10/07/2010 MRN: [id number]  [facility] [address] [place], [place]  [address]  [place]  Dear Madeline Schaefer,  Welcome to the Gastroenterology Division at [facility].    You are scheduled to see Dr.  Juanda Chance on 11-30-10 at 10:30A.M. on the 3rd floor at [facility], [address]  We ask that you try to arrive at our office 15 minutes prior to your appointment time to allow for check-in.  We would like you to complete the enclosed self-administered evaluation form prior to your visit and bring it with you on the day of your appointment.  We will review it with you.  Also, please bring a complete list of all your medications or, if you prefer, bring the medication bottles and we will list them.  Please bring your insurance card so that we may make a copy of it.  If your insurance requires a referral to see a specialist, please bring your referral form from your primary care physician.  Co-payments are due at the time of your visit and may be paid by cash, check or credit card.     Your office visit will consist of a consult with your physician (includes a physical exam), any laboratory testing he/she may order, scheduling of any necessary diagnostic testing (e.g. x-ray, ultrasound, CT-scan), and scheduling of a procedure (e.g. Endoscopy, Colonoscopy) if required.  Please allow enough time on your schedule to allow for any/all of these possibilities.    If you cannot keep your appointment, please call [phone] to cancel or reschedule prior to your appointment date.  This allows [place] the opportunity to schedule an appointment for another patient in need of care.  If you do not cancel or reschedule by 5 p.m. the business day prior to your appointment date, you will be charged a $50.00 late cancellation/no-show fee.    Thank you for  choosing [facility] for your medical needs.  We appreciate the opportunity to care for you.  Please visit [place] at our website  to learn more about our practice.                     Sincerely,                                                             The Gastroenterology Division

## 2010-10-13 NOTE — Assessment & Plan Note (Signed)
Summary: acute-pt has head and chest cold/ss--rm 4   Vital Signs:  Patient profile:   65 year old female Height:      70 inches Weight:      172.25 pounds BMI:     24.80 O2 Sat:      100 % on Room air Temp:     98.1 degrees F oral Pulse rate:   78 / minute Pulse rhythm:   regular Resp:     16 per minute BP sitting:   130 / 78  (right arm) Cuff size:   large  Vitals Entered By: Mervin Kung CMA Duncan Dull) (September 30, 2010 11:39 AM)  O2 Flow:  Room air CC: Pt states she has had cough, chest congestion x 1 week. Face feels swollen and ears feel full. Is Patient Diabetic? No Pain Assessment Patient in pain? no        Primary Care Provider:  Dondra Spry DO  CC:  Pt states she has had cough and chest congestion x 1 week. Face feels swollen and ears feel full.Marland Kitchen  History of Present Illness: [name] is a 65 year old female who presents today with chief complaint of chest congestion. Symptoms started 7-10 days ago. Symptoms are associated with productive cough (yellow sputum), ear fullness, nasal congestion (clear to yellow) and facial pressure. Symptoms are relieved by nothing.  Preventive Screening-Counseling & Management  Alcohol-Tobacco     Alcohol drinks/day: 1     Alcohol type: wine     Smoking Status: never  Allergies: 1)  ! Codeine 2)  ! Sulfa  Past History:  Past Medical History: Last updated: 12/01/2008 Coronary artery disease  S/P bare metal stent to LAD 2003 - Dr. Charlies Constable Hyperlipidemia Hypothyroidism Second degree AV block 1. Coronary artery status post placement of a bare metal stent to the     LAD in 2003, now stable. 2. Status post Medtronic Kappa DDD pacemaker implantation for second-     degree AV block in 2004, now stable with good pacer function. 3. Hyperlipidemia. 4. Treated hypothyroidism.  Past Surgical History: Last updated: 10/31/2008 Pacemaker DDD 2004 Colonoscopy 2003  Hysterectomy and BSO - 27 years ago Hx of  myomectomy   Review of Systems       see  HPI  Physical Exam  General:  Well-developed,well-nourished,in no acute distress; alert,appropriate and cooperative throughout examination Head:  Normocephalic and atraumatic without obvious abnormalities. No apparent alopecia or balding. No facial tenderness to palpation Ears:  mild erythema of R TM without associated bulging. Mouth:  Oral mucosa and oropharynx without lesions or exudates.  Teeth in good repair. Neck:  No deformities, masses, or tenderness noted. Lungs:  LLL crackles, soft left sided expiratory wheeze.  Breathing is even/non-labored.  Heart:  Normal rate and regular rhythm. S1 and S2 normal without gallop, murmur, click, rub or other extra sounds. Extremities:  No peripheral edema   Impression & Recommendations:  Problem # 1:  BRONCHITIS (ICD-490) Assessment New CXR negative for infiltrate.  Will plan to treat with ceftin and tessalon as needed cough.   Orders: CXR- 2view (CXR)  Her updated medication list for this problem includes:    Ceftin 500 Mg Tabs (Cefuroxime axetil) ..... One tablet by mouth two times a day for 10 days    Tessalon Perles 100 Mg Caps (Benzonatate) ..... One cap by mouth three times a day as needed for cough  Complete Medication List: 1)  Lipitor 80 Mg Tabs (Atorvastatin calcium) .... Take  1/2 tablet by mouth once a day 2)  Levoxyl 100 Mcg Tabs (Levothyroxine sodium) .Marland Kitchen.. 1 tab once daily 3)  Fish Oil 1000 Mg Caps (Omega-3 fatty acids) .... Take 1 tablet by mouth four times a day 4)  Aspirin Low Dose 81 Mg Tabs (Aspirin) .... Take 1 tablet by mouth once a day 5)  Calcium 600/vitamin D 600-400 Mg-unit Tabs (Calcium carbonate-vitamin d) .... Take 1/2  tablet  once daily 6)  Multivitamins Tabs (Multiple vitamin) .... Take 1 tablet by mouth once a day 7)  Vitamin D3 1000 Unit Tabs (Cholecalciferol) .... Take 1 tablet by mouth once a day 8)  Ceftin 500 Mg Tabs (Cefuroxime axetil) .... One tablet by  mouth two times a day for 10 days 9)  Tessalon Perles 100 Mg Caps (Benzonatate) .... One cap by mouth three times a day as needed for cough  Patient Instructions: 1)  Please complete your Chest X-ray on the first floor.   2)  We will call you with the results.  Prescriptions: TESSALON PERLES 100 MG CAPS (BENZONATATE) one cap by mouth three times a day as needed for cough  #30 x 0   Entered and Authorized by:   Lemont Fillers FNP   Signed by:   Lemont Fillers FNP on 09/30/2010   Method used:   Electronically to        [id number] (retail)       [address]       [place], [place]  [address]       Ph: [phone] or [phone]       Fax: [phone]   RxID:   [id number] CEFTIN 500 MG TABS (CEFUROXIME AXETIL) one tablet by mouth two times a day for 10 days  #20 x 0   Entered and Authorized by:   Lemont Fillers FNP   Signed by:   Lemont Fillers FNP on 09/30/2010   Method used:   Electronically to        [id number] (retail)       [address]       [place], [place]  [address]       Ph: [phone] or [phone]       Fax: [phone]   RxID:   [id number]    Orders Added: 1)  CXR- 2view [CXR] 2)  Est. Patient Level III [[address]]    Current Allergies (reviewed today): ! CODEINE ! SULFA

## 2010-10-27 ENCOUNTER — Ambulatory Visit (INDEPENDENT_AMBULATORY_CARE_PROVIDER_SITE_OTHER): Payer: BC Managed Care – PPO | Admitting: *Deleted

## 2010-10-27 ENCOUNTER — Other Ambulatory Visit: Payer: Self-pay | Admitting: Internal Medicine

## 2010-10-27 DIAGNOSIS — Z950 Presence of cardiac pacemaker: Secondary | ICD-10-CM

## 2010-10-27 DIAGNOSIS — I495 Sick sinus syndrome: Secondary | ICD-10-CM

## 2010-11-01 ENCOUNTER — Telehealth: Payer: Self-pay | Admitting: *Deleted

## 2010-11-01 NOTE — Telephone Encounter (Signed)
Pt left voice message stating she smashed her fingernail and it is now black and blue and throbbing. She applied ice immediately after the incident. Wants to know what else she should do? Please advise.

## 2010-11-01 NOTE — Telephone Encounter (Signed)
I would recommend that she have an x-ray to check for fracture of her finger tip,  but she should be seen in the office.

## 2010-11-01 NOTE — Telephone Encounter (Signed)
Notified pt. Pt declines visit at this time and states she will call for an appointment if the pain gets worse. Reports normal range of motion with injured finger at present.

## 2010-11-03 NOTE — Progress Notes (Signed)
Pacer remote 

## 2010-11-04 ENCOUNTER — Encounter: Payer: Self-pay | Admitting: *Deleted

## 2010-11-17 ENCOUNTER — Telehealth: Payer: Self-pay | Admitting: Cardiology

## 2010-11-17 NOTE — Telephone Encounter (Signed)
Pt wants to know why she needs to see dr allred for her device.

## 2010-11-17 NOTE — Telephone Encounter (Signed)
Left message for patient to explain that Dr. Johney Frame is the MD reassigned to Dr. Regino Schultze device patient.  Please call and make her yearly appt with Dr. Johney Frame.

## 2010-11-29 NOTE — Assessment & Plan Note (Signed)
[facility]                            CARDIOLOGY OFFICE NOTE   KAILANA, BENNINGER                      MRN:          [id number]  DATE:12/04/2006                            DOB:          1946-02-25    PRIMARY CARE PHYSICIAN:  Mosetta Putt, M.D.   CLINICAL HISTORY:  Mrs. Halbleib is 65 years old and has had a previous  bare-metal stent placed to the left anterior descending artery in 2003.  She also has second-degree AV block with inadequate rate response and  underwent a Medtronic Kapp DDD pacemaker implantation in 2004.  She has  done quite well since that time.  She has had no recent chest pain,  shortness of breath, or palpitations.  She and her husband are quite  active and go on trips and do hiking and she normally works out  regularly when she is here, although she has not done much of that since  she has been traveling.   PAST MEDICAL HISTORY:  Significant for hyperlipidemia and treated  hypothyroidism.   CURRENT MEDICATIONS:  Include aspirin, Lipitor 40, calcium, fish oil and  Levoxyl.  She brought in her lipid profile and the total cholesterol was  159, LDL was 91, HDL was 43, and triglyceride was 124.  She had been on  Vytorin but then was switched to Zocor alone and these laboratory values  reflect Zocor.  She is scheduled to have this checked again on the  Lipitor 40.   EXAMINATION:  VITAL SIGNS:  Blood pressure 152/81, the pulse 73 and  regular.  NECK:  There is no venous distention.  The carotid pulses were full  without bruits.  CHEST:  Clear.  CARDIAC:  Rhythm was regular.  The heart sounds were normal and there  were no murmurs, rubs or gallops.  ABDOMEN:  Soft with normal bowel sounds.  There was no  hepatosplenomegaly.  EXTREMITIES:  Peripheral pulses were full and there is no peripheral  edema.   Electrocardiogram showed atrial tracking and ventricular pacing.   We interrogated her pacemaker and she was atrial  tracking and  ventricular pacing greater than 95% of the time.  She had good  thresholds on both leads.   IMPRESSION:  1. Coronary artery disease status post bare-metal stent placement in      left anterior descending artery in 2003, now stable.  2. Status post Medtronic Kappa DDD pacemaker implantation for a second-      degree atrioventricular block in 2004, now stable with good pacer      function.  3. Hyperlipidemia.  4. Treated hypothyroidism.   RECOMMENDATIONS:  I think Mrs. Inboden is doing well.  Her weight is up  about 15 pounds over 2 years and I encouraged her to lose this weight.  Her lipid panel also is not target and  repeat is pending on Lipitor and Dr. Duaine Dredge is monitoring that.  Will  plan to see her back in cardiac followup in 1 year.     Bruce Elvera Lennox Juanda Chance, MD, [facility]  Electronically Signed    BRB/MedQ  DD: 12/04/2006  DT:  12/04/2006  Job #: [id number]

## 2010-11-29 NOTE — Assessment & Plan Note (Signed)
[facility]                            CARDIOLOGY OFFICE NOTE   SIMAR, POTHIER                      MRN:          [id number]  DATE:11/27/2007                            DOB:          1946-01-02    PRIMARY CARE PHYSICIAN:  Mosetta Putt, M.D.   CLINICAL HISTORY:  Ms. Shindler is 65 years old and returns for followup  management of coronary heart disease and her pacemaker.  She had a  Medtronic Kappa DDD pacemaker implanted 2004 for second-degree AV block.  She also had a previous bare metal stent placed to the LAD in 2003 for  angina.  She said she has done well recently with no recent chest pain,  shortness breath or palpitations.  She has gained weight.  She  attributes this to less exercise and more eating.   PAST MEDICAL HISTORY:  1. Hyperlipidemia.  2. Treated hypothyroidism.   CURRENT MEDICATION:  1. Aspirin.  2. Lipitor 40 mg daily.  3. Multivitamins.  4. Calcium.  5. Fish oil.  6. Levoxyl.   PHYSICAL EXAMINATION:  Blood pressure is 125/88, pulse 75 and regular.  There is no venous distension.  The carotid pulses were full without  bruits.  CHEST:  Clear.  HEART:  Rhythm was regular.  I hear no murmurs or gallops.  ABDOMEN:  Soft, normal bowel sounds.  There is no hepatosplenomegaly.  Peripheral pulses. There is no peripheral edema.   Recent cholesterol panel showed total cholesterol of 129 and LDL 69 and  HDL of  43 and triglycerides of 83, per Dr. Duaine Dredge.   IMPRESSION:  1. Coronary artery status post placement of a bare metal stent to the      LAD in 2003, now stable.  2. Status post Medtronic Kappa DDD pacemaker implantation for second-      degree AV block in 2004, now stable with good pacer function.  3. Hyperlipidemia.  4. Treated hypothyroidism.   We interrogated the pacemaker today and it has good threshold on both  channels.  She is pacer dependent with complete heart block.  She is  pacing the ventricle 100%  of the time and atrium 70% of the time.   I think Madeline Schaefer is doing well.  We will not make any changes in her  therapy and will plan to see her back in a year.  I encouraged to work  on her diet and exercise.     Bruce Elvera Lennox Juanda Chance, MD, [facility]  Electronically Signed    BRB/MedQ  DD: 11/27/2007  DT: 11/27/2007  Job #: [id number]

## 2010-11-30 ENCOUNTER — Ambulatory Visit: Payer: BC Managed Care – PPO | Admitting: Internal Medicine

## 2010-12-02 ENCOUNTER — Encounter: Payer: Self-pay | Admitting: Cardiology

## 2010-12-02 NOTE — Op Note (Signed)
NAME:  Madeline Schaefer, Madeline Schaefer                       ACCOUNT NO.:  [id number]   MEDICAL RECORD NO.:  [id number]                   PATIENT TYPE:  OIB   LOCATION:  4733                                 FACILITY:  [facility]   PHYSICIAN:  Charlies Constable, M.D.                  DATE OF BIRTH:  Aug 07, 1945   DATE OF PROCEDURE:  03/24/2003  DATE OF DISCHARGE:                                 OPERATIVE REPORT   PROCEDURE:  Pacemaker implantation note.   CLINICAL HISTORY:  Ms. Dickard is 65 years old and has coronary disease and  has previous stenting of the LAD and has intermittent second-degree AV  block.  This initially just occurred with exertion and although she was  symptomatic she held off on a decision to proceed with pacemaker.  Her  symptoms got worse and she developed 2:1 block at rest and we decided to  proceed with pacemaker implantation.   PROCEDURE:  Implantation of a Medtronic Kappa DDD pacemaker (model number  [id number], serial number [id number]) with a Medtronic bipolar screw-in  ventricular lead (model number 5076-52 cm, serial number [id number]) and a  Medtronic bipolar screw-in atrial lead (model 5076-45 cm, serial number  [id number]).   INDICATIONS:  Second-degree AV block with exertional dyspnea.   ANESTHESIA:  1% local Xylocaine.   ESTIMATED BLOOD LOSS:  Less than 20 ml.   COMPLICATIONS:  None.   PROCEDURE:  The procedure was performed in laboratory room #3.  The left  anterior chest was prepped and draped in the usual fashion.  The skin and  subcutaneous tissue with anesthetized with 1% local Xylocaine.  Using an 18-  guage thin wall needle, subclavian vein was entered and access was secured  with 0.038 wire.  Incision was made below the clavicle and extended in the  prepectoral fascia.  A pocket was made inferior to the incision using blunt  dissection.  Using two 9 French sheaths, the atrial and ventricular lead  were passed to the right atrium.  Figure-of-eight suture was  placed in the  entry site for hemostasis and for later string leads.  The 0.038 wire was  left in the subclavian vein for later access.  We had difficulty  manipulating the ventricular lead and had to reintroduce the sheath in order  to position the ventricular lead.  The atrial lead moved much more freely.  The ventricular lead was screwed into the right ventricular apex with good  pacing parameter as described below.  The atrial lead was screwed into the  right atrial appendage with good pacing parameter as described below.  After  removal of the stylets and the 0.038 wire, the figure-of-eight suture was  secured at the entry site.  The leads were attached to the prepectoral  fascia with two sutures of 1-0 silk around each elastic collar. The pocket  was irrigated with sterile kanamycin solution.  The leads were attached to  the generator with a hex nut wrench and the generator was implanted into the  pocket and secured loosely to the prepectoral fascia with 1-0 silk.  Subcutaneous tissue was closed with running 2-0 Dexon.  The skin was closed  with running 5-0 Dexon.   PACING PARAMETERS:  Atrial bipolar threshold 1.4 V, resistance 587 ohms, P  wave 4.5 mV.   Atrial unipolar threshold 1.0 V.  Impedance 472 ohms.  P wave 1.2 mV.   Ventricular bipolar:  R wave 7.1 mV, minimum pressure capture 0.4 V,  resistance 909 ohms.   Ventricular unipolar:  R wave 7.4 mV, minimum pressure capture 0.2 V,  resistance 807 ohms.   There is no pacing in the diaphragm in either unipolar or bipolar mode in  either lead.   The patient tolerated the procedure well and left the laboratory in  satisfactory condition.                                                Charlies Constable, M.D.    BB/MEDQ  D:  03/24/2003  T:  03/24/2003  Job:  [id number]   cc:   Mosetta Putt, M.D.  [address] [place]  [place] [address]  Fax: [phone]   Cardiopulmonary lab

## 2010-12-02 NOTE — Cardiovascular Report (Signed)
[place]. [facility]  Patient:    PANG, ROBERS Visit Number: [id number] MRN: [id number]          Service Type: CAT Location: 6500 [id number] Attending Physician:  Lenoria Farrier Dictated by:   Everardo Beals Juanda Chance, M.D. [facility] Proc. Date: 08/27/01 Admit Date:  08/27/2001   CC:         Dollene Cleveland, M.D.   Cardiac Catheterization  PROCEDURE PERFORMED: Cardiac catheterization and percutaneous coronary intervention.  CLINICAL HISTORY: The patient is 65 years old and has had exertional chest pain for about three months. She was seen in the office recently by Dian Queen and myself and we scheduled her for evaluation with angiography. She does have hyperlipidemia and she does have some family history of coronary heart disease.  DESCRIPTION OF PROCEDURE:  The procedure was performed via the right femoral artery using an arterial sheath and 6 French preformed coronary catheters.  A front wall arterial puncture was performed and Omnipaque contrast was used. After completion of the diagnostic study, we made a decision to proceed with intervention on the left anterior descending artery stenosis. The patient was given weight-adjusted heparin to prolong the ACT to greater than 200 seconds and was given double bolus Integrilin and infusion. We used a 6 [place] guiding catheter with side holes and a short floppy wire. We crossed the lesion in the proximal LAD with the wire without difficulty. We direct stented with a 2.75 x 13 mm Zeta stent, deploying this with two inflations of 12 and 15 atmospheres. Repeat diagnostic studies were then performed through the guiding catheter. The patient tolerated the procedure well and left the laboratory in satisfactory condition.  RESULTS:  1. The left main coronary artery:  The left main coronary artery was free of    significant disease. 2. The left anterior descending artery:  The left anterior    descending  artery gave rise to a large diagonal branch, a septal    perforator and a small diagonal branch. There was 80% narrowing in the    mid LAD after the first septal perforator. 3. The circumflex artery:  The circumflex artery gave rise to an intermediate    branch and two posterolateral branches. These vessels were free of    significant disease. 4. The right coronary artery:  The right coronary artery is a moderate    sized vessel that gave rise to a conus branch, right ventricular branch,    a posterior descending branch, and two posterolateral branches. These    vessels were free of significant disease.  LEFT VENTRICULOGRAM: The left ventriculogram performed in the RAO projection showed good wall motion with no areas of hypokinesis. The estimated ejection fraction was 60%.  Following stenting of the lesion in the proximal LAD, the stenosis improved from 80% to 0%. There was no dissection seen.  CONCLUSION: 1. Coronary artery disease with 80% narrowing in the proximal to the mid left    anterior descending artery, no major obstruction in the circumflex and    right coronary arteries, and normal left ventricular function. 2. Successful stenting of the proximal to mid left anterior descending artery    stenosis with improvement in percent diameter narrowing from 80% to 0%.  DISPOSITION: The patient was returned to the postangioplasty unit for further observation. Dictated by:   Everardo Beals Juanda Chance, M.D. [facility] Attending Physician:  Lenoria Farrier DD:  08/27/01 TD:  08/27/01 Job: [id number]

## 2010-12-02 NOTE — Discharge Summary (Signed)
[place]. [facility]  Patient:    Madeline, Schaefer Visit Number: [id number] MRN: [id number]          Service Type: CAT Location: 6500 [id number] Attending Physician:  Lenoria Farrier Dictated by:   Joellyn Rued, P.A.-C. Admit Date:  08/27/2001 Discharge Date: 08/28/2001   CC:         Bruce R. Juanda Chance, M.D. [facility]  Carolyne Fiscal, M.D.   Referring Physician Discharge [place]  DATE OF BIRTH:  12-08-1945  SUMMARY OF HISTORY:  Madeline Schaefer is a 65 year old white female, who was referred by Dr. Duaine Dredge for a one year history of exertional chest tightness which has been worse over the last several months.  She states that she walks regularly, and there are typically four hills that she encounters.  As she approaches the top of the hill, she notices chest tightness and shortness of breath which reoccurs with each hill.  She has not had any radiation, but she feels that it has gotten a little worse over the last several months.  She has not had any rest or nocturnal pain.  She does have a history of hyperlipidemia which is followed by Dr. Duaine Dredge and hypothyroidism that were both diagnosed within the last year.  LABORATORY DATA:  Preadmission H&H was 14.5 and 43.1, normal indices, platelets 242, WBC 4.2, BUN 24, creatinine .7.  Sodium 139, potassium 4.2.  SGPT was slightly elevated at 43.  It is noted that on August 02, 2001, her total cholesterol was 215, triglycerides 131, HDL 49, LDL 139, and a TSH 9.852.  Postprocedure H%H and chemistry were unremarkable.  EKG showed normal sinus rhythm.  HOSPITAL COURSE:  Madeline Schaefer underwent cardiac catheterization by Dr. Juanda Chance on August 27, 2001.  It is noted that catheterization report dictation and note is pending at the time of this discharge summary. According to Dr. Delia Chimes verbal report.  She had a LAD lesion which underwent angioplasty stenting with good results.  Postsheath removal and bed  rest, she was ambulating in the halls without difficulty.  Her right catheterization site did have a moderate amount of ecchymosis without hematoma or bruit. Prior to discharge, cardiac rehab assisted with ambulation and teaching. After Dr. Juanda Chance saw the patient on August 28, 2001, and reviewed, felt that she could be discharged home.  DISCHARGE DIAGNOSES: 1. Unstable angina. 2. Coronary artery disease, status post angioplasty stenting of her left    anterior descending. 3. History of hyperlipidemia. 4. Hypothyroidism.  DISCHARGE MEDICATIONS: 1. New prescription for Plavix 75 mg q.d. that she is instructed to take x 4    weeks. 2. She was asked to continue her coated aspirin 81 mg q.d. 3. Lipitor 10 mg q.h.s. 4. Levoxyl 0.1 mg q.d. 5. Given permission to continue her multivitamin and ibuprofen. 6. She also received a prescription for sublingual nitroglycerin 0.4 p.r.n.    with instructions.  DISPOSITION:  She was advised no lifting, driving, sexual activity, or heavy exertion for two days.  Maintain low salt, low fat, low cholesterol diet.  If she has any difficulties with her catheterization site, she will call us immediately.  Prior to discharge, she received an appointment for outpatient dietary teaching.  She will see Dr. Juanda Chance in the office on September 12, 2001, at 11:15 a.m. Dictated by:   Joellyn Rued, P.A.-C. Attending Physician:  Lenoria Farrier DD:  08/28/01 TD:  08/28/01 Job: [id number]

## 2010-12-02 NOTE — Procedures (Signed)
[place]. [facility]  Patient:    Madeline Schaefer, Madeline Schaefer Visit Number: [id number] MRN: [id number]          Service Type: END Location: ENDO Attending Physician:  Charna Elizabeth Dictated by:   Anselmo Rod, M.D. Proc. Date: 01/02/02 Admit Date:  01/02/2002   CC:         Carolyne Fiscal, M.D.   Procedure Report  DATE OF BIRTH:  Mar 18, 1946  REFERRING PHYSICIAN:  Carolyne Fiscal, M.D.  PROCEDURE PERFORMED:  Colonoscopy with biopsies.  ENDOSCOPIST:  Anselmo Rod, M.D.  INSTRUMENT USED:  Olympus pediatric adjustable video colonoscope.  INDICATIONS FOR PROCEDURE:  The patient is a 65 year old white female undergoing screening colonoscopy to rule out colonic polyps, masses, hemorrhoids, etc.  PREPROCEDURE PREPARATION:  Informed consent was procured from the patient. The patient was fasted for eight hours prior to the procedure and received two bottles of Fleets Phospho-Soda the night prior to the procedure.  She also used two Fleets enemas prior to the procedure.  PREPROCEDURE PHYSICAL:  The patient had stable vital signs.  Neck supple. Chest clear to auscultation.  S1, S2 regular.  Abdomen soft with normal bowel sounds.  DESCRIPTION OF PROCEDURE:  The patient was placed in the left lateral decubitus position and sedated with 100 mg of Demerol and 10 mg of Versed intravenously.  Once the patient was adequately sedated and maintained on low-flow oxygen and continuous cardiac monitoring, the Olympus video colonoscope was advanced from the rectum to the cecum and terminal ileum without difficulty.  The patient did have a very tortuous colon.  Multiple superficial ulcerations in the left colon from 20 to 30 cm.  This could be secondary to nonsteroidal use which the patient takes for her back pain.  Th4e cecum, right colon and transverse colon appeared healthy and so did the terminal ileum.  Small nonbleeding internal hemorrhoids were seen  on retroflexion in the rectum, no masses, polyps or diverticula were present.  IMPRESSION: 1. Patch of erosion seen from 20 to 30 cm biopsied for pathology, question    nonsteroidal colopathy. 2. Tortuous colon. 3. Small nonbleeding internal hemorrhoid.  RECOMMENDATIONS: 1. Await pathology results. 2. Avoid all nonsteroidals, decrease aspirin to 81 mg per day and use coated    baby aspirin for cardiac prophylaxis. 3. Outpatient follow-up for the next two weeks for further recommendations.Dictated by:   Anselmo Rod, M.D. Attending Physician:  Charna Elizabeth DD:  01/02/02 TD:  01/03/02 Job: [id number]

## 2010-12-02 NOTE — Discharge Summary (Signed)
NAME:  Madeline Schaefer, Madeline Schaefer                       ACCOUNT NO.:  [id number]   MEDICAL RECORD NO.:  [id number]                   PATIENT TYPE:  OIB   LOCATION:  4733                                 FACILITY:  [facility]   PHYSICIAN:  Charlies Constable, M.D.                  DATE OF BIRTH:  1946/07/12   DATE OF ADMISSION:  03/24/2003  DATE OF DISCHARGE:  03/25/2003                           DISCHARGE SUMMARY - REFERRING   HISTORY:  Madeline Schaefer is a 65 year old white female who presented to the  office on March 16, 2003, for followup visit because of increasing  shortness of breath with exertion.  She has a history of exercise-induced  second degree AV block which has been documented in the past, and this has  also been associated with exercise-induced dyspnea.  They discussed  permanent pacer in the past; however, she did not want to elect to do that  at that time.  She feels that her symptoms have become worse, and her pulse  rates have been down in the 40s.  She has not had any further chest  discomfort, but she is agreeable to having the pacemaker inserted at this  time.  She also has a history of an LAD stent placed in February 2003,  history of hyperlipidemia, and treated hypothyroidism.   LABORATORY DATA:  Admission H&H was 14.8 and 44.3, normal indices.  Platelets 187.  WBCs 5.7.  PT 13.9, PTT 30.  Sodium 140, potassium 3.5, BUN  18, creatinine 0.8, glucose 103.  EKG post pacer shows sinus rhythm,  ventricular pacing.   HOSPITAL COURSE:  Dr. Juanda Chance on March 24, 2003, inserted a Medtronic  Kappa model number [id number] via the left subclavian vein without difficulty.  AV leads were placed without difficulty.  Post procedure, she did have some  bruising surrounding the site; however, after review on March 25, 2003,  Dr. Juanda Chance felt that the patient could be discharged home.   DISCHARGE DIAGNOSES:  1. Exertional dyspnea on exertion associated with second-degree AV block,     status post  Medtronic Kappa pacer DDD insertion on March 24, 2003.  2. History as previously.   DISPOSITION:  Madeline Schaefer is discharged home.  She is asked to continue her  prior medications, these include:  Aspirin 81 mg q.d., vitamin E q.d.,  multivitamin q.d., Zocor 20 mg q.h.s., and Levoxyl 0.125 mg q.d.  Tylenol as  directed for discomfort.  Maintain low salt, fat, cholesterol diet.  She has  an appointment with Dr. Juanda Chance on Wednesday, April 08, 2003, at 11:45  a.m.  She received Luisa Hart, nurse practitioner with EP, discharge  instruction sheet in regards to wound care and activity restrictions.       EW/MEDQ  D:  03/25/2003  T:  03/25/2003  Job:  [id number]   cc:   Mosetta Putt, M.D.  [address] [place]  [place] [address]  Fax:  [phone]  

## 2010-12-07 ENCOUNTER — Ambulatory Visit (INDEPENDENT_AMBULATORY_CARE_PROVIDER_SITE_OTHER): Payer: BC Managed Care – PPO | Admitting: Cardiology

## 2010-12-07 ENCOUNTER — Encounter: Payer: Self-pay | Admitting: Cardiology

## 2010-12-07 VITALS — BP 136/88 | HR 67 | Ht 70.0 in | Wt 169.0 lb

## 2010-12-07 DIAGNOSIS — I2510 Atherosclerotic heart disease of native coronary artery without angina pectoris: Secondary | ICD-10-CM

## 2010-12-07 DIAGNOSIS — E785 Hyperlipidemia, unspecified: Secondary | ICD-10-CM

## 2010-12-07 DIAGNOSIS — Z950 Presence of cardiac pacemaker: Secondary | ICD-10-CM

## 2010-12-07 DIAGNOSIS — E039 Hypothyroidism, unspecified: Secondary | ICD-10-CM

## 2010-12-07 NOTE — Patient Instructions (Signed)
Your physician recommends that you continue on your current medications as directed. Please refer to the Current Medication list given to you today.   Your physician wants you to follow-up in:1 YEAR  You will receive a reminder letter in the mail two months in advance. If you don't receive a letter, please call our office to schedule the follow-up appointment.  

## 2010-12-19 ENCOUNTER — Ambulatory Visit: Payer: BC Managed Care – PPO | Admitting: Internal Medicine

## 2010-12-24 NOTE — Progress Notes (Signed)
HPI:  She is in for follow up.  She is a patient who was followed by Dr. Juanda Chance.  She underwent cath a year ago.  See results in section.  She has had a pacemaker as well.  [place]  She denies any chest tightness.  She asked today about pacer life, and we confirmed the findings in the pacer room.    Current Outpatient Prescriptions  Medication Sig Dispense Refill  . aspirin 81 MG tablet Take 81 mg by mouth daily.        Marland Kitchen atorvastatin (LIPITOR) 80 MG tablet Take 40 mg by mouth daily.        . Calcium Carbonate-Vitamin D (CALCIUM 600+D) 600-400 MG-UNIT per tablet Take 1 tablet by mouth daily.        . Cholecalciferol (VITAMIN D3) 1000 UNITS tablet Take 1,000 Units by mouth daily.        . fish oil-omega-3 fatty acids 1000 MG capsule Take 2 g by mouth daily.        Marland Kitchen levothyroxine (SYNTHROID, LEVOTHROID) 100 MCG tablet Take 100 mcg by mouth daily.        . Multiple Vitamin (MULTIVITAMIN) tablet Take 1 tablet by mouth daily.          Allergies  Allergen Reactions  . Codeine     REACTION: Passed  out, facial swelling and hives  . Sulfonamide Derivatives     Past Medical History  Diagnosis Date  . CAD (coronary artery disease)     s/p stent -- s/p PPm  . HLD (hyperlipidemia)   . Hypothyroidism   . Mobitz type 2 second degree heart block     Past Surgical History  Procedure Date  . Pacemaker insertion 2004    Medtronic Kappa  . Total abdominal hysterectomy w/ bilateral salpingoophorectomy   . Myomectomy     Family History  Problem Relation Age of Onset  . Prostate cancer    . Heart attack      History   Social History  . Marital Status: Married    Spouse Name: N/A    Number of Children: 0  . Years of Education: N/A   Occupational History  . Not on file.   Social History Main Topics  . Smoking status: Never Smoker   . Smokeless tobacco: Not on file  . Alcohol Use: Not on file  . Drug Use: Not on file  . Sexually Active: Not  on file   Other Topics Concern  . Not on file   Social History Narrative  . No narrative on file    ROS: Please see the HPI.  All other systems reviewed and negative.  PHYSICAL EXAM:  BP 136/88  Pulse 67  Ht 5\' 10"  (1.778 m)  Wt 169 lb (76.658 kg)  BMI 24.25 kg/m2  General: Well developed, well nourished, in no acute distress. Head:  Normocephalic and atraumatic. Neck: no JVD Lungs: Clear to auscultation and percussion. Heart: Normal S1 and paradoxical S2.  No murmur or rubs.    Abdomen:  Normal bowel sounds; soft; non tender; no organomegaly Pulses: Pulses normal in all 4 extremities. Extremities: No clubbing or cyanosis. No edema. Neurologic: Alert and oriented x 3.  EKG:  Atrial tracking, and ventricular pacing.  AV pacing with magnet. ASSESSMENT AND PLAN:

## 2010-12-27 NOTE — Assessment & Plan Note (Signed)
For pacer follow up here.  She is being seen in the next few months.  Battery life confirmed with pacer lab.

## 2010-12-27 NOTE — Assessment & Plan Note (Signed)
[place] by Blair Dolphin, and here by Dr. Artist Pais.  Was at [employer] last check.

## 2010-12-27 NOTE — Assessment & Plan Note (Signed)
On replacement, and followed by General Medicine.

## 2010-12-27 NOTE — Assessment & Plan Note (Signed)
She should remain on continued ASA.

## 2011-01-27 ENCOUNTER — Ambulatory Visit (INDEPENDENT_AMBULATORY_CARE_PROVIDER_SITE_OTHER): Payer: BC Managed Care – PPO | Admitting: Internal Medicine

## 2011-01-27 ENCOUNTER — Encounter: Payer: Self-pay | Admitting: Internal Medicine

## 2011-01-27 DIAGNOSIS — I2510 Atherosclerotic heart disease of native coronary artery without angina pectoris: Secondary | ICD-10-CM

## 2011-01-27 DIAGNOSIS — I441 Atrioventricular block, second degree: Secondary | ICD-10-CM

## 2011-01-27 NOTE — Patient Instructions (Signed)
Your physician wants you to follow-up in: 6 months with Dr. Allred. You will receive a reminder letter in the mail two months in advance. If you don't receive a letter, please call our office to schedule the follow-up appointment.  

## 2011-01-27 NOTE — Assessment & Plan Note (Signed)
Stable No change required today  

## 2011-01-27 NOTE — Assessment & Plan Note (Signed)
Normal pacemaker function See Arita Miss Art report No changes today  We will follow with monthly TTMs and I will see again in 6 months due to decreased estimated battery longevity.

## 2011-01-27 NOTE — Progress Notes (Signed)
The patient presents today for routine electrophysiology followup.  Since last being seen in our clinic, the patient reports doing very well.  She remains very active.  Today, she denies symptoms of palpitations, chest pain, shortness of breath, orthopnea, PND, lower extremity edema, dizziness, presyncope, syncope, or neurologic sequela.  The patient feels that she is tolerating medications without difficulties and is otherwise without complaint today.   Past Medical History  Diagnosis Date  . CAD (coronary artery disease)     s/p stent -- s/p PPm  . HLD (hyperlipidemia)   . Hypothyroidism   . Mobitz type 2 second degree heart block     s/p PPM by Dr Juanda Chance 2004   Past Surgical History  Procedure Date  . Pacemaker insertion 2004    Medtronic Kappa by Dr Juanda Chance  . Total abdominal hysterectomy w/ bilateral salpingoophorectomy   . Myomectomy     Current Outpatient Prescriptions  Medication Sig Dispense Refill  . aspirin 81 MG tablet Take 81 mg by mouth daily.        Marland Kitchen atorvastatin (LIPITOR) 80 MG tablet Take 40 mg by mouth daily.        . Calcium Carbonate-Vitamin D (CALCIUM 600+D) 600-400 MG-UNIT per tablet Take 1 tablet by mouth daily.        . Cholecalciferol (VITAMIN D3) 1000 UNITS tablet Take 1,000 Units by mouth daily.        . fish oil-omega-3 fatty acids 1000 MG capsule Take 2 g by mouth daily.       Marland Kitchen levothyroxine (SYNTHROID, LEVOTHROID) 100 MCG tablet Take 100 mcg by mouth daily.        . Multiple Vitamin (MULTIVITAMIN) tablet Take 1 tablet by mouth daily.          Allergies  Allergen Reactions  . Codeine     REACTION: Passed  out, facial swelling and hives  . Sulfonamide Derivatives     History   Social History  . Marital Status: Married    Spouse Name: N/A    Number of Children: 0  . Years of Education: N/A   Occupational History  . Not on file.   Social History Main Topics  . Smoking status: Never Smoker   . Smokeless tobacco: Not on file  . Alcohol Use:  No  . Drug Use: Not on file  . Sexually Active: Not on file   Other Topics Concern  . 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    Family History  Problem Relation Age of Onset  . Prostate cancer    . Heart attack     Physical Exam: Filed Vitals:   01/27/11 1139  BP: 142/80  Pulse: 70  Height: 5\' 10"  (1.778 m)  Weight: 167 lb 12.8 oz (76.114 kg)    GEN- The patient is well appearing, alert and oriented x 3 today.   Head- normocephalic, atraumatic Eyes-  Sclera clear, conjunctiva pink Ears- hearing intact Oropharynx- clear Neck- supple, no JVP Lymph- no cervical lymphadenopathy Lungs- Clear to ausculation bilaterally, normal work of breathing Chest- pacemaker pocket is well healed Heart- Regular rate and rhythm, no murmurs, rubs or gallops, PMI not laterally displaced GI- soft, NT, ND, + BS Extremities- no clubbing, cyanosis, or edema MS- no significant deformity or atrophy Skin- no rash or lesion Psych- euthymic mood, full affect Neuro- strength and sensation are intact  Pacemaker interrogation- reviewed in detail today,  See  PACEART report  Assessment and Plan:

## 2011-03-01 ENCOUNTER — Other Ambulatory Visit: Payer: Self-pay

## 2011-03-02 ENCOUNTER — Ambulatory Visit (INDEPENDENT_AMBULATORY_CARE_PROVIDER_SITE_OTHER): Payer: BC Managed Care – PPO | Admitting: *Deleted

## 2011-03-02 DIAGNOSIS — Z950 Presence of cardiac pacemaker: Secondary | ICD-10-CM

## 2011-03-02 DIAGNOSIS — I441 Atrioventricular block, second degree: Secondary | ICD-10-CM

## 2011-03-03 LAB — REMOTE PACEMAKER DEVICE
AL IMPEDENCE PM: 553 Ohm
ATRIAL PACING PM: 9
BAMS-0001: 175 {beats}/min
BATTERY VOLTAGE: 2.67 V
BRDY-0002RV: 60 {beats}/min
BRDY-0003RV: 150 {beats}/min
BRDY-0004RV: 130 {beats}/min
RV LEAD IMPEDENCE PM: 739 Ohm

## 2011-03-10 ENCOUNTER — Encounter: Payer: Self-pay | Admitting: *Deleted

## 2011-03-10 NOTE — Progress Notes (Signed)
Pacer remote check  

## 2011-04-06 ENCOUNTER — Encounter: Payer: BC Managed Care – PPO | Admitting: *Deleted

## 2011-04-06 ENCOUNTER — Ambulatory Visit (INDEPENDENT_AMBULATORY_CARE_PROVIDER_SITE_OTHER): Payer: BC Managed Care – PPO | Admitting: *Deleted

## 2011-04-06 ENCOUNTER — Other Ambulatory Visit: Payer: Self-pay

## 2011-04-06 ENCOUNTER — Encounter: Payer: Self-pay | Admitting: Internal Medicine

## 2011-04-06 DIAGNOSIS — I441 Atrioventricular block, second degree: Secondary | ICD-10-CM

## 2011-04-08 LAB — REMOTE PACEMAKER DEVICE
AL IMPEDENCE PM: 510 Ohm
ATRIAL PACING PM: 8
BAMS-0001: 175 {beats}/min
BATTERY VOLTAGE: 2.68 V
BRDY-0002RV: 60 {beats}/min
BRDY-0003RV: 150 {beats}/min
RV LEAD IMPEDENCE PM: 744 Ohm
RV LEAD THRESHOLD: 0.75 V

## 2011-04-14 NOTE — Progress Notes (Signed)
Pacer checked by remote 

## 2011-04-17 ENCOUNTER — Encounter: Payer: Self-pay | Admitting: *Deleted

## 2011-05-04 ENCOUNTER — Ambulatory Visit (INDEPENDENT_AMBULATORY_CARE_PROVIDER_SITE_OTHER): Payer: BC Managed Care – PPO | Admitting: *Deleted

## 2011-05-04 DIAGNOSIS — I441 Atrioventricular block, second degree: Secondary | ICD-10-CM

## 2011-05-04 DIAGNOSIS — Z950 Presence of cardiac pacemaker: Secondary | ICD-10-CM

## 2011-05-06 ENCOUNTER — Other Ambulatory Visit: Payer: Self-pay

## 2011-05-06 ENCOUNTER — Encounter: Payer: Self-pay | Admitting: Internal Medicine

## 2011-05-14 LAB — REMOTE PACEMAKER DEVICE: ATRIAL PACING PM: 7

## 2011-05-18 NOTE — Progress Notes (Signed)
Pacer remote check  

## 2011-05-23 ENCOUNTER — Encounter: Payer: Self-pay | Admitting: *Deleted

## 2011-07-26 ENCOUNTER — Encounter: Payer: Self-pay | Admitting: *Deleted

## 2011-07-31 ENCOUNTER — Encounter: Payer: Self-pay | Admitting: Internal Medicine

## 2011-07-31 ENCOUNTER — Ambulatory Visit (INDEPENDENT_AMBULATORY_CARE_PROVIDER_SITE_OTHER): Payer: Medicare Other | Admitting: Internal Medicine

## 2011-07-31 DIAGNOSIS — Z950 Presence of cardiac pacemaker: Secondary | ICD-10-CM

## 2011-07-31 DIAGNOSIS — I441 Atrioventricular block, second degree: Secondary | ICD-10-CM

## 2011-07-31 LAB — PACEMAKER DEVICE OBSERVATION
AL AMPLITUDE: 2.8 mv
AL THRESHOLD: 0.5 V
BAMS-0001: 175 {beats}/min
BATTERY VOLTAGE: 2.65 V
RV LEAD AMPLITUDE: 5.6 mv

## 2011-07-31 NOTE — Assessment & Plan Note (Signed)
Normal pacemaker function See Pace Art report No changes today  We will follow with monthly TTMs and I will see again in 6 months due to decreased estimated battery longevity. 

## 2011-07-31 NOTE — Progress Notes (Signed)
The patient presents today for routine electrophysiology followup.  Since last being seen in our clinic, the patient reports doing very well.  She remains very active.  Today, she denies symptoms of palpitations, chest pain, shortness of breath, orthopnea, PND, lower extremity edema, dizziness, presyncope, syncope, or neurologic sequela.  The patient feels that she is tolerating medications without difficulties and is otherwise without complaint today.   Past Medical History  Diagnosis Date  . CAD (coronary artery disease)     s/p stent -- s/p PPm  . HLD (hyperlipidemia)   . Hypothyroidism   . Mobitz type 2 second degree heart block     s/p PPM by Dr Juanda Chance 2004  . PACEMAKER, PERMANENT   . DYSPNEA   . BRONCHITIS    Past Surgical History  Procedure Date  . Pacemaker insertion 2004    Medtronic Kappa by Dr Juanda Chance  . Total abdominal hysterectomy w/ bilateral salpingoophorectomy   . Myomectomy     Current Outpatient Prescriptions  Medication Sig Dispense Refill  . aspirin 81 MG tablet Take 81 mg by mouth daily.        Marland Kitchen atorvastatin (LIPITOR) 80 MG tablet Take 40 mg by mouth daily.        . Calcium Carbonate-Vitamin D (CALCIUM 600+D) 600-400 MG-UNIT per tablet Take 1 tablet by mouth daily.        . Cholecalciferol (VITAMIN D3) 1000 UNITS tablet Take 1,000 Units by mouth daily.        . fish oil-omega-3 fatty acids 1000 MG capsule Take 2 g by mouth daily.       Marland Kitchen levothyroxine (SYNTHROID, LEVOTHROID) 100 MCG tablet Take 100 mcg by mouth daily.        . Multiple Vitamin (MULTIVITAMIN) tablet Take 1 tablet by mouth daily.          Allergies  Allergen Reactions  . Codeine     REACTION: Passed  out, facial swelling and hives  . Sulfonamide Derivatives     History   Social History  . Marital Status: Married    Spouse Name: N/A    Number of Children: 0  . Years of Education: N/A   Occupational History  . Not on file.   Social History Main Topics  . Smoking status: Never  Smoker   . Smokeless tobacco: Not on file  . Alcohol Use: No  . Drug Use: Not on file  . Sexually Active: Not on file   Other Topics Concern  . 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    Family History  Problem Relation Age of Onset  . Prostate cancer    . Heart attack     Physical Exam: Filed Vitals:   07/31/11 1122  BP: 138/86  Pulse: 75  Height: 5\' 10"  (1.778 m)  Weight: 171 lb (77.565 kg)    GEN- The patient is well appearing, alert and oriented x 3 today.   Head- normocephalic, atraumatic Eyes-  Sclera clear, conjunctiva pink Ears- hearing intact Oropharynx- clear Neck- supple, no JVP Lymph- no cervical lymphadenopathy Lungs- Clear to ausculation bilaterally, normal work of breathing Chest- pacemaker pocket is well healed Heart- Regular rate and rhythm, no murmurs, rubs or gallops, PMI not laterally displaced GI- soft, NT, ND, + BS Extremities- no clubbing, cyanosis, or edema  Pacemaker interrogation- reviewed in detail today,  See PACEART report  Assessment and Plan:

## 2011-07-31 NOTE — Patient Instructions (Signed)
Your physician wants you to follow-up in: 6 months with  Dr Jacquiline Doe will receive a reminder letter in the mail two months in advance. If you don't receive a letter, please call our office to schedule the follow-up appointment.  Remote monitoring is used to monitor your Pacemaker of ICD from home. This monitoring reduces the number of office visits required to check your device to one time per year. It allows [place] to keep an eye on the functioning of your device to ensure it is working properly. You are scheduled for a device check from home on 02/14/13You may send your transmission at any time that day. If you have a wireless device, the transmission will be sent automatically. After your physician reviews your transmission, you will receive a postcard with your next transmission date.

## 2011-08-29 DIAGNOSIS — L821 Other seborrheic keratosis: Secondary | ICD-10-CM | POA: Diagnosis not present

## 2011-08-29 DIAGNOSIS — L570 Actinic keratosis: Secondary | ICD-10-CM | POA: Diagnosis not present

## 2011-08-29 DIAGNOSIS — B351 Tinea unguium: Secondary | ICD-10-CM | POA: Diagnosis not present

## 2011-08-31 ENCOUNTER — Ambulatory Visit (INDEPENDENT_AMBULATORY_CARE_PROVIDER_SITE_OTHER): Payer: Medicare Other | Admitting: *Deleted

## 2011-08-31 ENCOUNTER — Encounter: Payer: Self-pay | Admitting: Internal Medicine

## 2011-08-31 DIAGNOSIS — I441 Atrioventricular block, second degree: Secondary | ICD-10-CM

## 2011-09-03 LAB — REMOTE PACEMAKER DEVICE
AL AMPLITUDE: 2.8 mv
ATRIAL PACING PM: 4
BATTERY VOLTAGE: 2.65 V
BRDY-0002RV: 60 {beats}/min
BRDY-0003RV: 150 {beats}/min
VENTRICULAR PACING PM: 100

## 2011-09-05 ENCOUNTER — Encounter: Payer: Self-pay | Admitting: *Deleted

## 2011-09-08 NOTE — Progress Notes (Signed)
PPM remote 

## 2011-09-19 DIAGNOSIS — Z1231 Encounter for screening mammogram for malignant neoplasm of breast: Secondary | ICD-10-CM | POA: Diagnosis not present

## 2011-10-05 ENCOUNTER — Encounter: Payer: Medicare Other | Admitting: *Deleted

## 2011-10-11 ENCOUNTER — Encounter: Payer: Self-pay | Admitting: *Deleted

## 2011-10-12 ENCOUNTER — Encounter: Payer: Self-pay | Admitting: Internal Medicine

## 2011-10-17 ENCOUNTER — Ambulatory Visit (INDEPENDENT_AMBULATORY_CARE_PROVIDER_SITE_OTHER): Payer: Medicare Other | Admitting: Family

## 2011-10-17 ENCOUNTER — Encounter: Payer: Self-pay | Admitting: Family

## 2011-10-17 ENCOUNTER — Ambulatory Visit: Payer: No Typology Code available for payment source | Admitting: Internal Medicine

## 2011-10-17 VITALS — BP 144/78 | HR 77 | Temp 97.8°F | Resp 16 | Wt 170.0 lb

## 2011-10-17 DIAGNOSIS — R1030 Lower abdominal pain, unspecified: Secondary | ICD-10-CM

## 2011-10-17 DIAGNOSIS — E039 Hypothyroidism, unspecified: Secondary | ICD-10-CM | POA: Diagnosis not present

## 2011-10-17 DIAGNOSIS — R1031 Right lower quadrant pain: Secondary | ICD-10-CM

## 2011-10-17 DIAGNOSIS — R109 Unspecified abdominal pain: Secondary | ICD-10-CM | POA: Diagnosis not present

## 2011-10-17 LAB — TSH: TSH: 0.656 u[IU]/mL (ref 0.350–4.500)

## 2011-10-17 NOTE — Assessment & Plan Note (Signed)
Suspect small inguinal hernia.  Will obtain CT abd/pelvis to further evaluation. Obtain Creatinine prior to contrast.  Clinically doubt appendicitis, however, I did advise her to go to the ED if worsening abdominal pain.  She verbalizes understanding.

## 2011-10-17 NOTE — Progress Notes (Signed)
Subjective:    Patient ID: Madeline Schaefer, female    DOB: 1946/04/18, 66 y.o.   MRN: [id number]  HPI  Ms.  Pardini is a 66 yr old female who presents today with chief complaint of groin pain.  She reports that the pain is "throbbing" in nature.  Worse with walking and sitting.  Seems better with laying flat.  Sometimes feels like something "pops out" but denies any visible bulging. Symptoms are intermittent and started 1 month ago.  She has have several abdominal surgeries. Review of Systems See HPI  Past Medical History  Diagnosis Date  . CAD (coronary artery disease)     s/p stent -- s/p PPm  . HLD (hyperlipidemia)   . Hypothyroidism   . Mobitz type 2 second degree heart block     s/p PPM by Dr Juanda Chance 2004  . PACEMAKER, PERMANENT   . DYSPNEA   . BRONCHITIS     History   Social History  . Marital Status: Married    Spouse Name: N/A    Number of Children: 0  . Years of Education: N/A   Occupational History  . Not on file.   Social History Main Topics  . Smoking status: Never Smoker   . Smokeless tobacco: Not on file  . Alcohol Use: No  . Drug Use: Not on file  . Sexually Active: Not on file   Other Topics Concern  . 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    Past Surgical History  Procedure Date  . Pacemaker insertion 2004    Medtronic Kappa by Dr Juanda Chance  . Total abdominal hysterectomy w/ bilateral salpingoophorectomy   . Myomectomy     Family History  Problem Relation Age of Onset  . Prostate cancer    . Heart attack      Allergies  Allergen Reactions  . Codeine     REACTION: Passed  out, facial swelling and hives  . Sulfonamide Derivatives     Current Outpatient Prescriptions on File Prior to Visit  Medication Sig Dispense Refill  . aspirin 81 MG tablet Take 81 mg by mouth daily.        Marland Kitchen atorvastatin (LIPITOR) 80 MG tablet Take 40 mg by mouth daily.        . Calcium  Carbonate-Vitamin D (CALCIUM 600+D) 600-400 MG-UNIT per tablet Take 1 tablet by mouth daily.        . Cholecalciferol (VITAMIN D3) 1000 UNITS tablet Take 1,000 Units by mouth daily.       . fish oil-omega-3 fatty acids 1000 MG capsule Take 2 g by mouth daily.       Marland Kitchen levothyroxine (SYNTHROID, LEVOTHROID) 100 MCG tablet Take 100 mcg by mouth daily.        . Multiple Vitamin (MULTIVITAMIN) tablet Take 1 tablet by mouth daily.          BP 144/78  Pulse 77  Temp(Src) 97.8 F (36.6 C) (Oral)  Resp 16  Wt 170 lb (77.111 kg)  SpO2 99%  LMP 10/17/1986       Objective:   Physical Exam  Constitutional: She appears well-developed and well-nourished. No distress.  Cardiovascular: Normal rate and regular rhythm.   No murmur heard. Pulmonary/Chest: Effort normal and breath sounds normal. No respiratory distress. She has no wheezes. She has no rales. She exhibits no tenderness.  Abdominal: Soft.       Mild tenderness  without guarding right lower abdomen just above pelvic bone, mild bulging is noted- reducible.   Musculoskeletal: She exhibits no edema.          Assessment & Plan:

## 2011-10-17 NOTE — Patient Instructions (Signed)
Please complete CT on the first floor. Follow up in 6 months, sooner if problems/concerns. Completed blood work prior to leaving today.

## 2011-10-17 NOTE — Assessment & Plan Note (Signed)
Obtain TSH °

## 2011-10-18 ENCOUNTER — Telehealth: Payer: Self-pay | Admitting: Family

## 2011-10-18 ENCOUNTER — Telehealth: Payer: Self-pay | Admitting: *Deleted

## 2011-10-18 ENCOUNTER — Other Ambulatory Visit: Payer: Self-pay | Admitting: Family

## 2011-10-18 ENCOUNTER — Ambulatory Visit (HOSPITAL_BASED_OUTPATIENT_CLINIC_OR_DEPARTMENT_OTHER)
Admission: RE | Admit: 2011-10-18 | Discharge: 2011-10-18 | Disposition: A | Discharge status: Home / Self Care | Payer: Medicare Other | Source: Ambulatory Visit | Attending: Family | Admitting: Family

## 2011-10-18 DIAGNOSIS — N200 Calculus of kidney: Secondary | ICD-10-CM | POA: Diagnosis not present

## 2011-10-18 DIAGNOSIS — E039 Hypothyroidism, unspecified: Secondary | ICD-10-CM

## 2011-10-18 DIAGNOSIS — R109 Unspecified abdominal pain: Secondary | ICD-10-CM

## 2011-10-18 DIAGNOSIS — R1031 Right lower quadrant pain: Secondary | ICD-10-CM | POA: Insufficient documentation

## 2011-10-18 DIAGNOSIS — R1030 Lower abdominal pain, unspecified: Secondary | ICD-10-CM

## 2011-10-18 LAB — BASIC METABOLIC PANEL
BUN: 24 mg/dL — ABNORMAL HIGH (ref 6–23)
CO2: 27 mEq/L (ref 19–32)
Calcium: 9.6 mg/dL (ref 8.4–10.5)
Creat: 0.81 mg/dL (ref 0.50–1.10)
Glucose, Bld: 95 mg/dL (ref 70–99)

## 2011-10-18 MED ORDER — IOHEXOL 300 MG/ML  SOLN
100.0000 mL | Freq: Once | INTRAMUSCULAR | Status: AC | PRN
  Administered 2011-10-18: 100 mL via INTRAVENOUS

## 2011-10-18 MED ORDER — LEVOTHYROXINE SODIUM 88 MCG PO TABS: 88.0000 ug | ORAL_TABLET | Freq: Every day | ORAL | Status: DC

## 2011-10-18 NOTE — Telephone Encounter (Signed)
Reviewed results with pt. I suspect small kidney stone is not cause for pain, but that it is musculoskeletal. Pt aware.

## 2011-10-18 NOTE — Telephone Encounter (Signed)
Pls call pt and let her know that thyroid testing shows that her current thyroid medication is a bit strong for her.  I would like to have her decrease to of synthroid.  OK for her to continue generic.  Repeat TSH in 6 weeks please (hypothyroid) RX pended, pls verify pharmacy with pt.

## 2011-10-18 NOTE — Telephone Encounter (Signed)
Received call from [facility] [place] that stat labs are final. Verified that results are in EPIC. Pt is supposed to have CT done today. BUN is slightly elevated, creatinine in normal range. Is it still ok for pt to receive contrast?

## 2011-10-18 NOTE — Telephone Encounter (Signed)
Dr Hodgin, please advise. 

## 2011-10-18 NOTE — Telephone Encounter (Signed)
Per verbal from Dr Rodena Medin, ok to proceed with contrast. Lab result called to radiology.

## 2011-10-18 NOTE — Telephone Encounter (Signed)
Notified pt per instructions below. She will return to the lab around 11/20/11 as she will be [place].  Pt requests brand name only on thyroid med. Rx sent to dispense brand name only.  Future lab order placed and forwarded to the lab. Copy mailed to pt as a reminder.

## 2011-10-19 ENCOUNTER — Encounter: Payer: Self-pay | Admitting: Internal Medicine

## 2011-10-19 ENCOUNTER — Ambulatory Visit (INDEPENDENT_AMBULATORY_CARE_PROVIDER_SITE_OTHER): Payer: Medicare Other | Admitting: *Deleted

## 2011-10-19 DIAGNOSIS — I441 Atrioventricular block, second degree: Secondary | ICD-10-CM

## 2011-10-20 LAB — REMOTE PACEMAKER DEVICE
BRDY-0003RV: 150 {beats}/min
BRDY-0004RV: 130 {beats}/min
RV LEAD THRESHOLD: 0.75 V
VENTRICULAR PACING PM: 100

## 2011-10-23 ENCOUNTER — Ambulatory Visit (INDEPENDENT_AMBULATORY_CARE_PROVIDER_SITE_OTHER): Payer: Medicare Other | Admitting: Cardiology

## 2011-10-23 ENCOUNTER — Telehealth: Payer: Self-pay | Admitting: *Deleted

## 2011-10-23 ENCOUNTER — Encounter: Payer: Self-pay | Admitting: Cardiology

## 2011-10-23 VITALS — BP 130/78 | HR 65 | Ht 70.0 in | Wt 169.0 lb

## 2011-10-23 DIAGNOSIS — M6740 Ganglion, unspecified site: Secondary | ICD-10-CM

## 2011-10-23 DIAGNOSIS — I2510 Atherosclerotic heart disease of native coronary artery without angina pectoris: Secondary | ICD-10-CM

## 2011-10-23 DIAGNOSIS — Z950 Presence of cardiac pacemaker: Secondary | ICD-10-CM

## 2011-10-23 DIAGNOSIS — E785 Hyperlipidemia, unspecified: Secondary | ICD-10-CM | POA: Diagnosis not present

## 2011-10-23 DIAGNOSIS — E039 Hypothyroidism, unspecified: Secondary | ICD-10-CM

## 2011-10-23 DIAGNOSIS — IMO0002 Reserved for concepts with insufficient information to code with codable children: Secondary | ICD-10-CM

## 2011-10-23 MED ORDER — LEVOTHYROXINE SODIUM 88 MCG PO TABS: 88.0000 ug | ORAL_TABLET | Freq: Every day | ORAL | Status: DC

## 2011-10-23 NOTE — Assessment & Plan Note (Deleted)
Not sure what this is.  Looks like cyst--soft, not erythematous.  Asked him to see his primary MD.

## 2011-10-23 NOTE — Assessment & Plan Note (Signed)
Awaiting to hear from device clinic  [id number].

## 2011-10-23 NOTE — Assessment & Plan Note (Signed)
Prior stenting of the LAD.  Last cath was done without restenosis.  No current symptoms.

## 2011-10-23 NOTE — Telephone Encounter (Signed)
Spoke with pt, she requests levothyroxine rx be sent to [facility] in [facility]. Rx was previously sent to [facility]. Pt states she contacted [facility] and they instructed her to have [facility] call them to request transfer of Rx. Pt has not used [facility] previously. Cancelled rx at [facility] per [place]. Sent rx via [facility] to [facility]. Pt notified.

## 2011-10-23 NOTE — Progress Notes (Addendum)
   HPI:  She is doing well.  Getting ready to leave for three months and would like to get device replaced.  Also aches a bit in the morning.  But then gets better.  Wants to know if from statins.  Denies any chest pain.     Current Outpatient Prescriptions  Medication Sig Dispense Refill  . aspirin 81 MG tablet Take 81 mg by mouth daily.        Marland Kitchen atorvastatin (LIPITOR) 80 MG tablet Take 40 mg by mouth daily.        . Calcium Carbonate-Vitamin D (CALCIUM 600+D) 600-400 MG-UNIT per tablet Take 1 tablet by mouth daily.        . Cholecalciferol (VITAMIN D3) 1000 UNITS tablet Take 2,000 Units by mouth daily.       . fish oil-omega-3 fatty acids 1000 MG capsule Take 2 g by mouth daily.       Marland Kitchen levothyroxine (SYNTHROID, LEVOTHROID) 88 MCG tablet Take 1 tablet (88 mcg total) by mouth daily. Please dispense BRAND NAME only.  30 tablet  2  . Multiple Vitamin (MULTIVITAMIN) tablet Take 1 tablet by mouth daily.          Allergies  Allergen Reactions  . Codeine     REACTION: Passed  out, facial swelling and hives  . Sulfonamide Derivatives     Past Medical History  Diagnosis Date  . CAD (coronary artery disease)     s/p stent -- s/p PPm  . HLD (hyperlipidemia)   . Hypothyroidism   . Mobitz type 2 second degree heart block     s/p PPM by Dr Juanda Chance 2004  . PACEMAKER, PERMANENT   . DYSPNEA   . BRONCHITIS     Past Surgical History  Procedure Date  . Pacemaker insertion 2004    Medtronic Kappa by Dr Juanda Chance  . Total abdominal hysterectomy w/ bilateral salpingoophorectomy   . Myomectomy     Family History  Problem Relation Age of Onset  . Prostate cancer    . Heart attack      History   Social History  . Marital Status: Married    Spouse Name: N/A    Number of Children: 0  . Years of Education: N/A   Occupational History  . Not on file.   Social History Main Topics  . Smoking status: Never Smoker   . Smokeless tobacco: Not on file  . Alcohol Use: No  . Drug Use: Not on  file  . Sexually Active: Not on file   Other Topics Concern  . 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    ROS: Please see the HPI.  All other systems reviewed and negative.  PHYSICAL EXAM:  BP 130/78  Pulse 65  Ht 5\' 10"  (1.778 m)  Wt 169 lb (76.658 kg)  BMI 24.25 kg/m2  LMP 10/17/1986  General: Well developed, well nourished, in no acute distress. Head:  Normocephalic and atraumatic. Neck: no JVD Lungs: Clear to auscultation and percussion. Heart: Normal S1 and paradoxical S2.  No murmur.   Pulses: Pulses normal in all 4 extremities. Extremities: No clubbing or cyanosis. No edema.   Neurologic: Alert and oriented x 3.  EKG:  Atrial tracking and ventricular pacing.    ASSESSMENT AND PLAN:

## 2011-10-23 NOTE — Assessment & Plan Note (Signed)
[place]  Doubt she has myopathy but worth checking CPK at that time.  Instructions given to her to do.

## 2011-10-23 NOTE — Patient Instructions (Addendum)
Your physician wants you to follow-up in: 4 MONTHS with Dr Riley Kill.  You will receive a reminder letter in the mail two months in advance. If you don't receive a letter, please call our office to schedule the follow-up appointment.  Your physician recommends that you continue on your current medications as directed. Please refer to the Current Medication list given to you today.  Please have a CPK checked with your next Lipid check.

## 2011-11-02 ENCOUNTER — Encounter: Payer: Self-pay | Admitting: *Deleted

## 2011-11-02 ENCOUNTER — Encounter: Payer: Medicare Other | Admitting: *Deleted

## 2011-11-02 NOTE — Progress Notes (Signed)
Remote pacer check  

## 2011-11-22 ENCOUNTER — Telehealth: Payer: Self-pay | Admitting: Family

## 2011-11-22 DIAGNOSIS — E039 Hypothyroidism, unspecified: Secondary | ICD-10-CM

## 2011-11-22 DIAGNOSIS — E785 Hyperlipidemia, unspecified: Secondary | ICD-10-CM

## 2011-11-22 DIAGNOSIS — M791 Myalgia, unspecified site: Secondary | ICD-10-CM

## 2011-11-22 NOTE — Telephone Encounter (Signed)
Please add FLP and LFT (hyperlipidemia).

## 2011-11-22 NOTE — Telephone Encounter (Signed)
Notified pt. She states she has been aching a lot lately and is requesting to have a CPK level. Ok per verbal from Provider to add the test. Future orders have been placed and given to the lab.

## 2011-11-22 NOTE — Telephone Encounter (Signed)
She is coming to have her thyroid checked and wants to have her cholesterol checked at the same time could you add that to the order.  Also she would like to have a blood test done to have inflamation checked that is caused by cholesterol meds.

## 2011-11-22 NOTE — Telephone Encounter (Signed)
Please advise 

## 2011-11-23 ENCOUNTER — Encounter: Payer: Self-pay | Admitting: Internal Medicine

## 2011-11-23 ENCOUNTER — Ambulatory Visit (INDEPENDENT_AMBULATORY_CARE_PROVIDER_SITE_OTHER): Payer: Medicare Other | Admitting: *Deleted

## 2011-11-23 DIAGNOSIS — I441 Atrioventricular block, second degree: Secondary | ICD-10-CM | POA: Diagnosis not present

## 2011-11-23 DIAGNOSIS — E785 Hyperlipidemia, unspecified: Secondary | ICD-10-CM | POA: Diagnosis not present

## 2011-11-23 DIAGNOSIS — E039 Hypothyroidism, unspecified: Secondary | ICD-10-CM | POA: Diagnosis not present

## 2011-11-23 DIAGNOSIS — IMO0001 Reserved for inherently not codable concepts without codable children: Secondary | ICD-10-CM | POA: Diagnosis not present

## 2011-11-23 LAB — CK: Total CK: 94 U/L (ref 7–177)

## 2011-11-23 NOTE — Telephone Encounter (Signed)
Addended by: Mervin Kung A on: 11/23/2011 10:47 AM   Modules accepted: Orders

## 2011-11-23 NOTE — Telephone Encounter (Signed)
Pt presented to the lab, future order was released and given to the lab.

## 2011-11-24 LAB — HEPATIC FUNCTION PANEL
ALT: 24 U/L (ref 0–35)
AST: 22 U/L (ref 0–37)
Albumin: 4.3 g/dL (ref 3.5–5.2)
Alkaline Phosphatase: 64 U/L (ref 39–117)
Bilirubin, Direct: 0.1 mg/dL (ref 0.0–0.3)
Indirect Bilirubin: 0.6 mg/dL (ref 0.0–0.9)
Total Bilirubin: 0.7 mg/dL (ref 0.3–1.2)
Total Protein: 7.1 g/dL (ref 6.0–8.3)

## 2011-11-24 LAB — LIPID PANEL
Cholesterol: 132 mg/dL (ref 0–200)
HDL: 36 mg/dL — ABNORMAL LOW
LDL Cholesterol: 75 mg/dL (ref 0–99)
Total CHOL/HDL Ratio: 3.7 ratio
Triglycerides: 106 mg/dL
VLDL: 21 mg/dL (ref 0–40)

## 2011-11-24 LAB — REMOTE PACEMAKER DEVICE
AL AMPLITUDE: 2.8 mv
AL IMPEDENCE PM: 547 Ohm
ATRIAL PACING PM: 5
BAMS-0001: 175 {beats}/min
BATTERY VOLTAGE: 2.6 v
BRDY-0002RV: 60 {beats}/min
BRDY-0003RV: 150 {beats}/min
BRDY-0004RV: 130 {beats}/min
RV LEAD IMPEDENCE PM: 851 Ohm
RV LEAD THRESHOLD: 0.75 v
VENTRICULAR PACING PM: 100

## 2011-11-28 ENCOUNTER — Telehealth: Payer: Self-pay | Admitting: Cardiology

## 2011-11-28 NOTE — Telephone Encounter (Signed)
The pt would like Dr Rosalyn Charters input about her pacemaker.  The pt is scheduled to see Dr Johney Frame on June 6th and they are leaving the country June 30th for a 3 month trip. The pt is concerned because her pacemaker is nearing ERI and wonders if she has enough time to get a battery change prior to leaving. I made the pt aware that all of these things depend upon her device check on the 6th.  If the pt has not gotten to ERI and Dr Johney Frame wants to perform generator change then this could require a peer to peer review for coverage. I will have Tresa Endo pencil this pt's name in on Dr Jenel Lucks schedule for possible generator change.

## 2011-11-28 NOTE — Telephone Encounter (Signed)
Pt wants to ask Dr. Riley Kill a question regarding pacer I explained that Dr. Johney Frame takes care of pacer and she replied she knows but she wants to talk to Dr. Riley Kill

## 2011-12-01 ENCOUNTER — Encounter: Payer: Self-pay | Admitting: *Deleted

## 2011-12-08 NOTE — Progress Notes (Signed)
PPM remote 

## 2011-12-21 ENCOUNTER — Encounter: Payer: Self-pay | Admitting: *Deleted

## 2011-12-21 ENCOUNTER — Encounter: Payer: Self-pay | Admitting: Internal Medicine

## 2011-12-21 ENCOUNTER — Encounter (HOSPITAL_COMMUNITY): Payer: Self-pay | Admitting: Pharmacy Technician

## 2011-12-21 ENCOUNTER — Ambulatory Visit (INDEPENDENT_AMBULATORY_CARE_PROVIDER_SITE_OTHER): Payer: Medicare Other | Admitting: Internal Medicine

## 2011-12-21 VITALS — BP 130/70 | HR 64 | Ht 70.0 in | Wt 170.8 lb

## 2011-12-21 DIAGNOSIS — I2510 Atherosclerotic heart disease of native coronary artery without angina pectoris: Secondary | ICD-10-CM

## 2011-12-21 DIAGNOSIS — E785 Hyperlipidemia, unspecified: Secondary | ICD-10-CM

## 2011-12-21 DIAGNOSIS — I441 Atrioventricular block, second degree: Secondary | ICD-10-CM

## 2011-12-21 LAB — BASIC METABOLIC PANEL
CO2: 28 mEq/L (ref 19–32)
Calcium: 8.5 mg/dL (ref 8.4–10.5)
Creatinine, Ser: 0.8 mg/dL (ref 0.4–1.2)
Glucose, Bld: 108 mg/dL — ABNORMAL HIGH (ref 70–99)

## 2011-12-21 LAB — CBC WITH DIFFERENTIAL/PLATELET
Eosinophils Relative: 4.8 % (ref 0.0–5.0)
HCT: 42.8 % (ref 36.0–46.0)
Hemoglobin: 14.1 g/dL (ref 12.0–15.0)
Lymphs Abs: 1.7 10*3/uL (ref 0.7–4.0)
Monocytes Relative: 5.6 % (ref 3.0–12.0)
Neutro Abs: 3.9 10*3/uL (ref 1.4–7.7)
RDW: 14.8 % — ABNORMAL HIGH (ref 11.5–14.6)

## 2011-12-21 LAB — PACEMAKER DEVICE OBSERVATION
BATTERY VOLTAGE: 2.61 V
BMOD-0005RV: 95 {beats}/min
BRDY-0002RV: 65 {beats}/min
RV LEAD THRESHOLD: 1.5 V

## 2011-12-21 NOTE — Progress Notes (Signed)
PCP: Lemont Fillers., NP, NP Primary Cardiologist:  Dr Riley Kill  The patient presents today for routine electrophysiology followup.  Since last being seen in our clinic, the patient reports doing reasonably well.  She remains very active.  Over the past 2 weeks, she has noticed decreased exercise tolerance with mild SOB.  This corresponds to reaching ERI battery status on her pacemaker.  Today, she denies symptoms of  chest pain, orthopnea, PND, lower extremity edema, dizziness, presyncope, syncope, or neurologic sequela.  The patient feels that she is tolerating medications without difficulties and is otherwise without complaint today.   Past Medical History  Diagnosis Date  . CAD (coronary artery disease)     s/p stent -- s/p PPm  . HLD (hyperlipidemia)   . Hypothyroidism   . Mobitz type 2 second degree heart block     s/p PPM by Dr Juanda Chance 2004  . PACEMAKER, PERMANENT   . DYSPNEA   . BRONCHITIS    Past Surgical History  Procedure Date  . Pacemaker insertion 2004    Medtronic Kappa by Dr Juanda Chance  . Total abdominal hysterectomy w/ bilateral salpingoophorectomy   . Myomectomy     Current Outpatient Prescriptions  Medication Sig Dispense Refill  . atorvastatin (LIPITOR) 80 MG tablet Take 40 mg by mouth daily.        . fish oil-omega-3 fatty acids 1000 MG capsule Take 2 g by mouth daily.       Marland Kitchen levothyroxine (SYNTHROID, LEVOTHROID) 88 MCG tablet Take 1 tablet (88 mcg total) by mouth daily. Please dispense BRAND NAME only.  30 tablet  2  . Multiple Vitamin (MULTIVITAMIN) tablet Take 1 tablet by mouth daily.        Marland Kitchen aspirin EC 81 MG tablet Take 81 mg by mouth daily.      . Cholecalciferol 2000 UNITS CAPS Take 4,000 Units by mouth daily.        Allergies  Allergen Reactions  . Codeine Other (See Comments)    REACTION: Passed  out, facial swelling and hives  . Sulfonamide Derivatives Hives    History   Social History  . Marital Status: Married    Spouse Name: N/A   Number of Children: 0  . Years of Education: N/A   Occupational History  . Not on file.   Social History Main Topics  . Smoking status: Never Smoker   . Smokeless tobacco: Not on file  . Alcohol Use: No  . Drug Use: Not on file  . Sexually Active: Not on file   Other Topics Concern  . 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    Family History  Problem Relation Age of Onset  . Prostate cancer    . Heart attack     Physical Exam: Filed Vitals:   12/21/11 1210  BP: 130/70  Pulse: 64  Height: 5\' 10"  (1.778 m)  Weight: 170 lb 12.8 oz (77.474 kg)    GEN- The patient is well appearing, alert and oriented x 3 today.   Head- normocephalic, atraumatic Eyes-  Sclera clear, conjunctiva pink Ears- hearing intact Oropharynx- clear Neck- supple, no JVP Lymph- no cervical lymphadenopathy Lungs- Clear to ausculation bilaterally, normal work of breathing Chest- pacemaker pocket is well healed Heart- Regular rate and rhythm, no murmurs, rubs or gallops, PMI not laterally displaced GI- soft, NT, ND, + BS Extremities- no clubbing, cyanosis, or edema  Pacemaker interrogation- reviewed in  detail today,  See PACEART report  Assessment and Plan:

## 2011-12-21 NOTE — Assessment & Plan Note (Signed)
Stable No change required today  

## 2011-12-21 NOTE — Assessment & Plan Note (Signed)
The patient has reached ERI battery status and has reverted to VVI pacing.  She is symptomatic with this. She requires pacemaker pulse generator replacement at this time.  Risks, benefits, alternatives to pacemaker pulse generator replacement were discussed in detail with the patient today. The patient understands that the risks include but are not limited to bleeding and infection.  If lead revision were to be required, additional risks would include pneumothorax, perforation, tamponade, vascular damage, renal failure, MI, stroke, death,  and lead dislodgement.  The patient wishes to proceed. We will therefore schedule the procedure at the next available time.

## 2011-12-21 NOTE — Patient Instructions (Signed)
PRE-OP LABS TODAY.  CBC & BMET

## 2011-12-22 ENCOUNTER — Other Ambulatory Visit: Payer: Self-pay | Admitting: *Deleted

## 2011-12-22 ENCOUNTER — Encounter (HOSPITAL_COMMUNITY)
Admission: RE | Disposition: A | Discharge status: Home / Self Care | Payer: Self-pay | Source: Ambulatory Visit | Attending: Internal Medicine

## 2011-12-22 ENCOUNTER — Ambulatory Visit (HOSPITAL_COMMUNITY)
Admission: RE | Admit: 2011-12-22 | Discharge: 2011-12-22 | Disposition: A | Discharge status: Home / Self Care | Payer: Medicare Other | Source: Ambulatory Visit | Attending: Internal Medicine | Admitting: Internal Medicine

## 2011-12-22 DIAGNOSIS — I441 Atrioventricular block, second degree: Secondary | ICD-10-CM | POA: Diagnosis not present

## 2011-12-22 DIAGNOSIS — I2510 Atherosclerotic heart disease of native coronary artery without angina pectoris: Secondary | ICD-10-CM | POA: Diagnosis not present

## 2011-12-22 DIAGNOSIS — Z45018 Encounter for adjustment and management of other part of cardiac pacemaker: Secondary | ICD-10-CM | POA: Diagnosis not present

## 2011-12-22 DIAGNOSIS — E785 Hyperlipidemia, unspecified: Secondary | ICD-10-CM | POA: Insufficient documentation

## 2011-12-22 DIAGNOSIS — E039 Hypothyroidism, unspecified: Secondary | ICD-10-CM | POA: Insufficient documentation

## 2011-12-22 DIAGNOSIS — Z950 Presence of cardiac pacemaker: Secondary | ICD-10-CM | POA: Insufficient documentation

## 2011-12-22 HISTORY — PX: PACEMAKER GENERATOR CHANGE: SHX5481

## 2011-12-22 LAB — SURGICAL PCR SCREEN
MRSA, PCR: NEGATIVE
Staphylococcus aureus: POSITIVE — AB

## 2011-12-22 SURGERY — PACEMAKER GENERATOR CHANGE
Anesthesia: LOCAL

## 2011-12-22 MED ORDER — MUPIROCIN 2 % EX OINT
TOPICAL_OINTMENT | CUTANEOUS | Status: AC
  Administered 2011-12-22: 12:00:00
  Filled 2011-12-22: qty 22

## 2011-12-22 MED ORDER — GENTAMICIN SULFATE 40 MG/ML IJ SOLN
INTRAMUSCULAR | Status: DC
  Filled 2011-12-22 (×2): qty 2

## 2011-12-22 MED ORDER — SODIUM CHLORIDE 0.45 % IV SOLN
INTRAVENOUS | Status: DC
  Administered 2011-12-22: 12:00:00 via INTRAVENOUS

## 2011-12-22 MED ORDER — CEFAZOLIN SODIUM 1-5 GM-% IV SOLN
1.0000 g | INTRAVENOUS | Status: AC
  Administered 2011-12-22: 1 g via INTRAVENOUS
  Filled 2011-12-22: qty 50

## 2011-12-22 MED ORDER — SODIUM CHLORIDE 0.9 % IJ SOLN: 3.0000 mL | INTRAMUSCULAR | Status: DC | PRN

## 2011-12-22 MED ORDER — SODIUM CHLORIDE 0.9 % IJ SOLN: 3.0000 mL | Freq: Two times a day (BID) | INTRAMUSCULAR | Status: DC

## 2011-12-22 MED ORDER — FENTANYL CITRATE 0.05 MG/ML IJ SOLN
INTRAMUSCULAR | Status: AC
  Filled 2011-12-22: qty 2

## 2011-12-22 MED ORDER — ACETAMINOPHEN 325 MG PO TABS: 325.0000 mg | ORAL_TABLET | ORAL | Status: DC | PRN

## 2011-12-22 MED ORDER — LIDOCAINE HCL (PF) 1 % IJ SOLN
INTRAMUSCULAR | Status: AC
  Filled 2011-12-22: qty 60

## 2011-12-22 MED ORDER — CEFAZOLIN SODIUM 1-5 GM-% IV SOLN
INTRAVENOUS | Status: AC
  Filled 2011-12-22: qty 50

## 2011-12-22 MED ORDER — MIDAZOLAM HCL 5 MG/5ML IJ SOLN
INTRAMUSCULAR | Status: AC
  Filled 2011-12-22: qty 5

## 2011-12-22 MED ORDER — GENTAMICIN SULFATE 40 MG/ML IJ SOLN
80.0000 mg | INTRAMUSCULAR | Status: DC
  Filled 2011-12-22: qty 2

## 2011-12-22 MED ORDER — SODIUM CHLORIDE 0.9 % IV SOLN: 250.0000 mL | INTRAVENOUS | Status: DC | PRN

## 2011-12-22 MED ORDER — CHLORHEXIDINE GLUCONATE 4 % EX LIQD
60.0000 mL | Freq: Once | CUTANEOUS | Status: DC
  Filled 2011-12-22: qty 60

## 2011-12-22 MED ORDER — ONDANSETRON HCL 4 MG/2ML IJ SOLN: 4.0000 mg | Freq: Four times a day (QID) | INTRAMUSCULAR | Status: DC | PRN

## 2011-12-22 NOTE — H&P (View-Only) (Signed)
PCP: O'SULLIVAN,[name] S., NP, NP Primary Cardiologist:  Dr Stuckey  The patient presents today for routine electrophysiology followup.  Since last being seen in our clinic, the patient reports doing reasonably well.  She remains very active.  Over the past 2 weeks, she has noticed decreased exercise tolerance with mild SOB.  This corresponds to reaching ERI battery status on her pacemaker.  Today, she denies symptoms of  chest pain, orthopnea, PND, lower extremity edema, dizziness, presyncope, syncope, or neurologic sequela.  The patient feels that she is tolerating medications without difficulties and is otherwise without complaint today.   Past Medical History  Diagnosis Date  . CAD (coronary artery disease)     s/p stent -- s/p PPm  . HLD (hyperlipidemia)   . Hypothyroidism   . Mobitz type 2 second degree heart block     s/p PPM by Dr Brodie 2004  . PACEMAKER, PERMANENT   . DYSPNEA   . BRONCHITIS    Past Surgical History  Procedure Date  . Pacemaker insertion 2004    Medtronic Kappa by Dr Brodie  . Total abdominal hysterectomy w/ bilateral salpingoophorectomy   . Myomectomy     Current Outpatient Prescriptions  Medication Sig Dispense Refill  . atorvastatin (LIPITOR) 80 MG tablet Take 40 mg by mouth daily.        . fish oil-omega-3 fatty acids 1000 MG capsule Take 2 g by mouth daily.       . levothyroxine (SYNTHROID, LEVOTHROID) 88 MCG tablet Take 1 tablet (88 mcg total) by mouth daily. Please dispense BRAND NAME only.  30 tablet  2  . Multiple Vitamin (MULTIVITAMIN) tablet Take 1 tablet by mouth daily.        . aspirin EC 81 MG tablet Take 81 mg by mouth daily.      . Cholecalciferol 2000 UNITS CAPS Take 4,000 Units by mouth daily.        Allergies  Allergen Reactions  . Codeine Other (See Comments)    REACTION: Passed  out, facial swelling and hives  . Sulfonamide Derivatives Hives    History   Social History  . Marital Status: Married    Spouse Name: N/A   Number of Children: 0  . Years of Education: N/A   Occupational History  . Not on file.   Social History Main Topics  . Smoking status: Never Smoker   . Smokeless tobacco: Not on file  . Alcohol Use: No  . Drug Use: Not on file  . Sexually Active: Not on file   Other Topics Concern  . Not on file   Social History Narrative   Retired Real Estate agent.  [place]    Family History  Problem Relation Age of Onset  . Prostate cancer    . Heart attack     Physical Exam: Filed Vitals:   12/21/11 1210  BP: 130/70  Pulse: 64  Height: 5' 10" (1.778 m)  Weight: 170 lb 12.8 oz (77.474 kg)    GEN- The patient is well appearing, alert and oriented x 3 today.   Head- normocephalic, atraumatic Eyes-  Sclera clear, conjunctiva pink Ears- hearing intact Oropharynx- clear Neck- supple, no JVP Lymph- no cervical lymphadenopathy Lungs- Clear to ausculation bilaterally, normal work of breathing Chest- pacemaker pocket is well healed Heart- Regular rate and rhythm, no murmurs, rubs or gallops, PMI not laterally displaced GI- soft, NT, ND, + BS Extremities- no clubbing, cyanosis, or edema  Pacemaker interrogation- reviewed in   detail today,  See PACEART report  Assessment and Plan:  

## 2011-12-22 NOTE — Discharge Instructions (Signed)
Pacemaker Battery Change A pacemaker battery usually lasts 4 to 12 years. Once or twice per year, you will be asked to visit your caregiver to have a full evaluation of your pacemaker. When a battery needs to be replaced, the entire pacemaker is actually replaced so that you can benefit from new circuitry and any new features that have recently been added to pacemakers. Most often, this procedure is very simple because the leads are already in place. After giving medicine to numb the skin, your health care provider makes a cut to reopen the pocket holding the pacemaker and disconnects the old device from its leads. The leads are routinely tested at this time. If they are working okay, the new pacemaker may simply be connected to the existing leads. If there is any problem with the old lead system, it may be wise to replace the lead system while inserting the new pacemaker. There are many things that affect how long a pacemaker battery will last:  Age of the pacemaker.   Number of leads (1, 2 or 3).   Pacemaker work load. If the pacemaker is helping the heart more often, then the battery will not last as long as if the pacemaker does not need to help the heart.   Resistance of the leads. The greater the resistance, the greater the drain on the battery. This can increase as the leads get older or if one or more of the leads does not have the best contact with the heart.   Power (voltage) settings.   The health of the person's heart. If the health of the heart gets worse, then the pacemaker may have to work more often and the setting changed to accommodate these changes.  Your health care provider will be alerted to the fact that it is time to replace the battery during follow-up exams. He or she will check your pacemaker using a small table-top computer, called a programmer, and a wand. The wand is about the same size as a remote control. Your provider puts the wand on your body in the area where the  pacemaker is located. Information from the pacemaker is received about how well your heart is working and the status of the battery. It is not painful, and it usually takes just a few minutes. You will have plenty of time before the battery is fully used up to plan for replacement.  LET YOUR CAREGIVER KNOW ABOUT:   Symptoms of chest pain, trouble breathing, palpitations, lightheadedness, or feelings of an abnormal or irregular heart beat.   Allergies.   Medications taken including herbs, eye drops, over the counter medications, and creams   Use of steroids (by mouth or creams).   Possible pregnancy, if applicable.   Previous problems with anesthetics or Novocaine.   History of blood clots (thrombophlebitis).   History of bleeding or blood problems.   Surgery since your last pacemaker placement.   Other health problems.  RISKS AND COMPLICATIONS These are very uncommon but include:  Bleeding.   Bruising of the skin around where the incision was made.   Pain at the site of the incision.   Pulling apart of the skin at the incision site.   Infection.   Allergic reaction to anesthetics or medicines used during the procedure.  Diabetics may have a temporary increase in their blood sugar after any surgical procedure.  BEFORE THE PROCEDURE  Wash all of the skin around the area of the chest where the pacemaker is located.   Try to remove any loose, scaling skin. Unless advised otherwise, avoid using aspirin, ibuprofen, or naproxen for 3-4 days before the procedure. Ask your caregiver for help with any other medication adjustments before the pacemaker is replaced. Unless advised otherwise, do not eat or drink after midnight on the night before the procedure EXCEPT for drinking water and taking your medications as you normally would. AFTER THE PROCEDURE   A heart monitor and the pacemaker programmer will be used to make sure that the new pacemaker is working properly.   You can go home  after the procedure.   Your caregiver will advise you if you need to have any stitches. They will be removed 5-7 days after the procedure.  HOME CARE INSTRUCTIONS   Keep the incision clean and dry.   Unless advised otherwise, you may shower after carefully covering the incision with plastic wrap that is taped to your chest.   For the first week after the replacement, avoid stretching motions that pull at the incision site and avoid heavy exercise with the arm on the same side as the incision.   Only take over-the-counter or prescription medicines for pain, discomfort, or fever as directed by your caregiver.   Your caregiver will tell you when you will need to next test your pacemaker by telephone or when to return to the office for re-exam and/or removal of stitches, if necessary.  SEEK MEDICAL CARE IF:   You have unusual pain at the incision site that is not adequately helped by over-the-counter or prescription medicine.   There is drainage or pus from the incision site.   You develop red streaking that extends above or below the incision site.   You feel brief intermittent palpitations, lightheadedness or any symptoms that you feel might be related to your heart.  SEEK IMMEDIATE MEDICAL CARE IF:   You experience chest pain that is different than the pain at the incision site.   You experience:   Shortness of breath.   Palpitations.   Irregular heart beat.   Lightheadedness that does not go away quickly.   Fainting.   You develop a fever.   You have pain that gets worse even though you are taking pain medicine.  MAKE SURE YOU:   Understand these instructions.   Will watch your condition.   Will get help right away if you are not doing well or get worse.  Document Released: 10/11/2006 Document Revised: 06/22/2011 Document Reviewed: 01/14/2007 Exit[employer] Patient Information 2012 [employer]. 

## 2011-12-22 NOTE — Op Note (Signed)
SURGEON:  Hillis Range, MD     PREPROCEDURE DIAGNOSES:   1. Mobitz II second degree AV block  2. Pacemaker at ERI    POSTPROCEDURE DIAGNOSES:   1. Mobitz II second degree AV block  2. Pacemaker at [facility]     PROCEDURES:   1. Pacemaker pulse generator replacement.   2. Skin pocket revision.     INTRODUCTION:  Madeline Schaefer is a 66 y.o. female with a history of mobitz II second degree AV block.  She has done well since his pacemaker was implanted.  She has recently reached ERI battery status.  She presents today for pacemaker pulse generator replacement.       DESCRIPTION OF THE PROCEDURE:  Informed written consent was obtained, and the patient was brought to the electrophysiology lab in the fasting state.  The patient's pacemaker was interrogated today and found to be at elective replacement indicator battery status.  The patient was adequately sedated with intravenous Versed as outlined in the nursing report.  The patient's left chest was prepped and draped in the usual sterile fashion by the EP lab staff.  The skin overlying the existing pacemaker was infiltrated with lidocaine for local analgesia.  A 4-cm incision was made over the pacemaker pocket.  Using a combination of sharp and blunt dissection, the pacemaker was exposed and removed from the body.  The device was disconnected from the leads.  There was no foreign matter or debris within the pocket.  The atrial lead was confirmed to be a Medtronic model [id number] (serial number [id number]) lead implanted on 03/24/03.  The right ventricular lead was confirmed to be a Medtronic model [id number] (serial number [id number]) lead implanted on the same date as the atrial lead (above).  Both leads were examined and their integrity was confirmed to be intact.  Atrial lead P-waves measured 3.6 mV with impedance of 498 ohms and a threshold of 0.5 V at 0.5 msec.  Right ventricular lead R-waves measured 5.8 mV with impedance of 680 ohms and a threshold of 0.6 V  at 0.5 msec.  Both leads were connected to a Medtronic adapt L model ADDDR 1 (serial number [id number]) pacemaker.  The pocket was revised to accommodate this new device.  Electrocautery was required to assure hemostasis.  The pocket was irrigated with copious gentamicin solution. The pacemaker was then placed into the pocket.  The pocket was then closed in 2 layers with 2-0 Vicryl suture over the subcutaneous and subcuticular layers.  Steri-Strips and a sterile dressing were then applied.  There were no early apparent complications.     CONCLUSIONS:   1. Successful pacemaker pulse generator replacement for elective replacement indicator battery status   2. No early apparent complications.     Hillis Range, MD 12/22/2011 3:57 PM

## 2011-12-22 NOTE — Interval H&P Note (Signed)
History and Physical Interval Note:  12/22/2011 1:59 PM  Madeline Schaefer  has presented today for surgery, with the diagnosis of End of life  The various methods of treatment have been discussed with the patient and family. After consideration of risks, benefits and other options for treatment, the patient has consented to  Procedure(s) (LRB): PACEMAKER GENERATOR CHANGE (N/A) as a surgical intervention .  The patients' history has been reviewed, patient examined, no change in status, stable for surgery.  I have reviewed the patients' chart and labs.  Questions were answered to the patient's satisfaction.     Hillis Range

## 2011-12-25 ENCOUNTER — Telehealth: Payer: Self-pay | Admitting: Internal Medicine

## 2011-12-25 NOTE — Telephone Encounter (Signed)
Appt scheduled for Monday, 01/01/12 at 12:30ppm per Belenda Cruise in the [facility].  Pt was notified of date and time of appt.

## 2011-12-25 NOTE — Telephone Encounter (Signed)
New msg Pt had procedure last Friday and wanted to know when she needs to come in office

## 2011-12-28 ENCOUNTER — Telehealth: Payer: Self-pay | Admitting: Family

## 2011-12-28 DIAGNOSIS — E039 Hypothyroidism, unspecified: Secondary | ICD-10-CM

## 2011-12-28 MED ORDER — LEVOTHYROXINE SODIUM 88 MCG PO TABS: 88.0000 ug | ORAL_TABLET | Freq: Every day | ORAL | Status: DC

## 2011-12-28 NOTE — Telephone Encounter (Signed)
Patient states that refills should be sent to [id number] in [place]

## 2011-12-28 NOTE — Telephone Encounter (Signed)
ok 

## 2011-12-28 NOTE — Telephone Encounter (Signed)
Patient states that she has run out of synthroid for the month. She is requesting 10 pills to last her until the rest of this month. Also, she states that she will be out of the area for 3 months and is requesting a three month supply of synthroid. She states that she does not want the generic form.   Patient says that she needs this refilled by tomorrow morning.

## 2011-12-28 NOTE — Telephone Encounter (Signed)
Rx sent to pharmacy   

## 2011-12-29 ENCOUNTER — Encounter: Payer: Self-pay | Admitting: Internal Medicine

## 2011-12-29 ENCOUNTER — Ambulatory Visit (INDEPENDENT_AMBULATORY_CARE_PROVIDER_SITE_OTHER): Payer: Medicare Other | Admitting: *Deleted

## 2011-12-29 DIAGNOSIS — I441 Atrioventricular block, second degree: Secondary | ICD-10-CM

## 2011-12-29 LAB — PACEMAKER DEVICE OBSERVATION
AL AMPLITUDE: 4 mv
BAMS-0001: 150 {beats}/min
RV LEAD AMPLITUDE: 5.6 mv
RV LEAD THRESHOLD: 0.75 V

## 2011-12-29 NOTE — Progress Notes (Signed)
Wound check-PPM 

## 2012-01-01 ENCOUNTER — Ambulatory Visit: Payer: Medicare Other

## 2012-01-05 ENCOUNTER — Ambulatory Visit: Payer: Medicare Other

## 2012-04-05 ENCOUNTER — Encounter: Payer: Self-pay | Admitting: Family

## 2012-04-05 ENCOUNTER — Ambulatory Visit (INDEPENDENT_AMBULATORY_CARE_PROVIDER_SITE_OTHER): Payer: Medicare Other | Admitting: Family

## 2012-04-05 VITALS — BP 124/80 | HR 77 | Temp 98.2°F | Resp 16 | Ht 70.0 in | Wt 166.1 lb

## 2012-04-05 DIAGNOSIS — J40 Bronchitis, not specified as acute or chronic: Secondary | ICD-10-CM | POA: Diagnosis not present

## 2012-04-05 MED ORDER — AZITHROMYCIN 250 MG PO TABS: ORAL_TABLET | ORAL | Status: DC

## 2012-04-05 NOTE — Progress Notes (Signed)
Subjective:    Patient ID: Madeline Schaefer, female    DOB: 02/12/1946, 66 y.o.   MRN: [id number]  HPI  Madeline Schaefer is a 66 yr old female who presents today with chief complaint of cough.  Symptoms started about 8 days ago as a bad head cold.  Now settled in her chest.  Cough is worst in the morning. She denies associated fever.  Feels overall, that her symptoms are improving some.      Review of Systems See HPI  Past Medical History  Diagnosis Date  . CAD (coronary artery disease)     s/p stent -- s/p PPm  . HLD (hyperlipidemia)   . Hypothyroidism   . Mobitz type 2 second degree heart block     s/p PPM by Dr Juanda Chance 2004  . PACEMAKER, PERMANENT   . DYSPNEA   . BRONCHITIS     History   Social History  . Marital Status: Married    Spouse Name: N/A    Number of Children: 0  . Years of Education: N/A   Occupational History  . Not on file.   Social History Main Topics  . Smoking status: Never Smoker   . Smokeless tobacco: Not on file  . Alcohol Use: No  . Drug Use: Not on file  . Sexually Active: Not on file   Other Topics Concern  . 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    Past Surgical History  Procedure Date  . Pacemaker insertion 2004    Medtronic Kappa by Dr Juanda Chance  . Total abdominal hysterectomy w/ bilateral salpingoophorectomy   . Myomectomy     Family History  Problem Relation Age of Onset  . Prostate cancer    . Heart attack      Allergies  Allergen Reactions  . Codeine Other (See Comments)    REACTION: Passed  out, facial swelling and hives  . Sulfonamide Derivatives Hives    Current Outpatient Prescriptions on File Prior to Visit  Medication Sig Dispense Refill  . aspirin EC 81 MG tablet Take 81 mg by mouth daily.      Marland Kitchen atorvastatin (LIPITOR) 80 MG tablet Take 40 mg by mouth daily.        . Cholecalciferol 2000 UNITS CAPS Take 4,000 Units by mouth daily.      . fish  oil-omega-3 fatty acids 1000 MG capsule Take 2 g by mouth daily.       Marland Kitchen levothyroxine (SYNTHROID, LEVOTHROID) 88 MCG tablet Take 1 tablet (88 mcg total) by mouth daily. Please dispense BRAND NAME only.  10 tablet  0  . Multiple Vitamin (MULTIVITAMIN) tablet Take 1 tablet by mouth daily.          BP 124/80  Pulse 77  Temp 98.2 F (36.8 C) (Oral)  Resp 16  Ht 5\' 10"  (1.778 m)  Wt 166 lb 1.3 oz (75.333 kg)  BMI 23.83 kg/m2  SpO2 97%  LMP 10/17/1986       Objective:   Physical Exam  Constitutional: She is oriented to person, place, and time. She appears well-developed and well-nourished. No distress.  HENT:  Head: Normocephalic and atraumatic.  Right Ear: Tympanic membrane and ear canal normal.  Left Ear: Tympanic membrane and ear canal normal.  Mouth/Throat: No oropharyngeal exudate, posterior oropharyngeal edema or posterior oropharyngeal erythema.  Cardiovascular: Normal rate and regular rhythm.   No murmur heard. Pulmonary/Chest: Effort  normal and breath sounds normal. No respiratory distress. She has no wheezes. She has no rales. She exhibits no tenderness.  Musculoskeletal: She exhibits no edema.  Lymphadenopathy:    She has no cervical adenopathy.  Neurological: She is alert and oriented to person, place, and time.  Psychiatric: She has a normal mood and affect. Her behavior is normal. Judgment and thought content normal.          Assessment & Plan:

## 2012-04-05 NOTE — Patient Instructions (Addendum)
Please call if your symptoms worsen or if no improvement in 2-3 days. 

## 2012-04-09 DIAGNOSIS — J40 Bronchitis, not specified as acute or chronic: Secondary | ICD-10-CM | POA: Insufficient documentation

## 2012-04-09 NOTE — Assessment & Plan Note (Signed)
Will rx with zithromax.   

## 2012-04-23 ENCOUNTER — Encounter: Payer: Self-pay | Admitting: *Deleted

## 2012-04-25 DIAGNOSIS — B351 Tinea unguium: Secondary | ICD-10-CM | POA: Diagnosis not present

## 2012-04-25 DIAGNOSIS — L821 Other seborrheic keratosis: Secondary | ICD-10-CM | POA: Diagnosis not present

## 2012-04-29 ENCOUNTER — Encounter: Payer: Self-pay | Admitting: Internal Medicine

## 2012-04-29 ENCOUNTER — Ambulatory Visit (INDEPENDENT_AMBULATORY_CARE_PROVIDER_SITE_OTHER): Payer: Medicare Other | Admitting: Internal Medicine

## 2012-04-29 VITALS — BP 148/84 | HR 73 | Ht 70.0 in | Wt 157.0 lb

## 2012-04-29 DIAGNOSIS — I441 Atrioventricular block, second degree: Secondary | ICD-10-CM | POA: Diagnosis not present

## 2012-04-29 LAB — PACEMAKER DEVICE OBSERVATION
AL AMPLITUDE: 4 mv
ATRIAL PACING PM: 7
BAMS-0001: 150 {beats}/min
BATTERY VOLTAGE: 2.79 V
VENTRICULAR PACING PM: 91

## 2012-04-29 NOTE — Progress Notes (Signed)
PCP: Lemont Fillers., NP  Madeline Schaefer is a 66 y.o. female who presents today for routine electrophysiology followup.  Since her generator change 6/13, the patient reports doing very well.  Today, she denies symptoms of palpitations, chest pain, shortness of breath,  lower extremity edema, dizziness, presyncope, or syncope.  The patient is otherwise without complaint today.   Past Medical History  Diagnosis Date  . CAD (coronary artery disease)     s/p stent -- s/p PPm  . HLD (hyperlipidemia)   . Hypothyroidism   . Mobitz type 2 second degree heart block     s/p PPM by Dr Juanda Chance 2004  . PACEMAKER, PERMANENT   . DYSPNEA   . BRONCHITIS    Past Surgical History  Procedure Date  . Pacemaker insertion 2004, 12/22/11    initial implant by Dr Juanda Chance, Generator change (MDT Adapta L) by Dr Johney Frame 6/13  . Total abdominal hysterectomy w/ bilateral salpingoophorectomy   . Myomectomy     Current Outpatient Prescriptions  Medication Sig Dispense Refill  . aspirin EC 81 MG tablet Take 81 mg by mouth daily.      Marland Kitchen atorvastatin (LIPITOR) 80 MG tablet Take 40 mg by mouth daily.        Marland Kitchen azithromycin (ZITHROMAX) 250 MG tablet 2 tabs by mouth today, then one tablet by mouth daily for 4 more days.  6 tablet  0  . Cholecalciferol 2000 UNITS CAPS Take 4,000 Units by mouth daily.      . fish oil-omega-3 fatty acids 1000 MG capsule Take 2 g by mouth daily.       Marland Kitchen levothyroxine (SYNTHROID, LEVOTHROID) 88 MCG tablet Take 1 tablet (88 mcg total) by mouth daily. Please dispense BRAND NAME only.  10 tablet  0  . Multiple Vitamin (MULTIVITAMIN) tablet Take 1 tablet by mouth daily.          Physical Exam: Filed Vitals:   04/29/12 0945  BP: 148/84  Pulse: 73  Height: 5\' 10"  (1.778 m)  Weight: 157 lb (71.215 kg)    GEN- The patient is well appearing, alert and oriented x 3 today.   Head- normocephalic, atraumatic Eyes-  Sclera clear, conjunctiva pink Ears- hearing intact Oropharynx-  clear Lungs- Clear to ausculation bilaterally, normal work of breathing Chest- pacemaker pocket is well healed Heart- Regular rate and rhythm, no murmurs, rubs or gallops, PMI not laterally displaced GI- soft, NT, ND, + BS Extremities- no clubbing, cyanosis, or edema  Pacemaker interrogation- reviewed in detail today,  See PACEART report  Assessment and Plan:  1. Mobitz II second degree AV block Normal pacemaker function See Pace Art report No changes today  2. HTN BP mildly elevated today  (138/80 by MD) Salt restriction advised

## 2012-04-29 NOTE — Patient Instructions (Signed)
Your physician wants you to follow-up in: 12 months with Dr Allred You will receive a reminder letter in the mail two months in advance. If you don't receive a letter, please call our office to schedule the follow-up appointment.  

## 2012-04-30 ENCOUNTER — Telehealth: Payer: Self-pay | Admitting: Family

## 2012-04-30 DIAGNOSIS — E039 Hypothyroidism, unspecified: Secondary | ICD-10-CM

## 2012-04-30 MED ORDER — LEVOTHYROXINE SODIUM 88 MCG PO TABS: 88.0000 ug | ORAL_TABLET | Freq: Every day | ORAL | Status: DC

## 2012-04-30 NOTE — Telephone Encounter (Signed)
Refill levothyroxin 88 mcg tab qty 120 take 1 tablet by mouth every day with 8 ounces of water 30 minutes before breakfast /other meds  Last fill 01-03-2012

## 2012-04-30 NOTE — Telephone Encounter (Signed)
Done/SLS 

## 2012-05-06 DIAGNOSIS — Z23 Encounter for immunization: Secondary | ICD-10-CM | POA: Diagnosis not present

## 2012-05-20 ENCOUNTER — Encounter: Payer: Medicare Other | Admitting: Internal Medicine

## 2012-06-06 DIAGNOSIS — H5200 Hypermetropia, unspecified eye: Secondary | ICD-10-CM | POA: Diagnosis not present

## 2012-06-06 DIAGNOSIS — H25019 Cortical age-related cataract, unspecified eye: Secondary | ICD-10-CM | POA: Diagnosis not present

## 2012-07-04 ENCOUNTER — Other Ambulatory Visit: Payer: Self-pay | Admitting: Family

## 2012-07-05 NOTE — Telephone Encounter (Signed)
Patient returned phone call. Informed her of refill denial. She scheduled follow up for 07/26/12

## 2012-07-05 NOTE — Telephone Encounter (Signed)
Left message for patient to return my call.

## 2012-07-05 NOTE — Telephone Encounter (Signed)
Spoke with pharmacy and verified that levothyroxine rx was last filled on 05/02/12 for a 90 day supply. Pt should not need refill until January. Refill denied, not due yet. Pt was due for follow up in October and has not scheduled appt.  Please call pt to arrange appt before current supply runs out as her thyroid level will need to be checked again before we give further refills.

## 2012-07-26 ENCOUNTER — Encounter: Payer: Self-pay | Admitting: Family

## 2012-07-26 ENCOUNTER — Ambulatory Visit (INDEPENDENT_AMBULATORY_CARE_PROVIDER_SITE_OTHER): Payer: Medicare Other | Admitting: Family

## 2012-07-26 VITALS — BP 120/80 | HR 68 | Temp 98.0°F | Resp 16 | Ht 70.0 in | Wt 170.1 lb

## 2012-07-26 DIAGNOSIS — E785 Hyperlipidemia, unspecified: Secondary | ICD-10-CM

## 2012-07-26 DIAGNOSIS — E559 Vitamin D deficiency, unspecified: Secondary | ICD-10-CM | POA: Diagnosis not present

## 2012-07-26 DIAGNOSIS — E039 Hypothyroidism, unspecified: Secondary | ICD-10-CM | POA: Diagnosis not present

## 2012-07-26 DIAGNOSIS — Z0000 Encounter for general adult medical examination without abnormal findings: Secondary | ICD-10-CM

## 2012-07-26 LAB — LIPID PANEL
Cholesterol: 178 mg/dL (ref 0–200)
HDL: 45 mg/dL (ref >39)
Total CHOL/HDL Ratio: 4 Ratio
Triglycerides: 105 mg/dL (ref <150)
VLDL: 21 mg/dL (ref 0–40)

## 2012-07-26 LAB — HEPATIC FUNCTION PANEL
AST: 21 U/L (ref 0–37)
Bilirubin, Direct: 0.2 mg/dL (ref 0.0–0.3)
Total Bilirubin: 1 mg/dL (ref 0.3–1.2)

## 2012-07-26 MED ORDER — KETOCONAZOLE 2 % EX SHAM: MEDICATED_SHAMPOO | CUTANEOUS | Status: DC

## 2012-07-26 MED ORDER — CYCLOBENZAPRINE HCL 5 MG PO TABS: 5.0000 mg | ORAL_TABLET | Freq: Three times a day (TID) | ORAL | Status: DC | PRN

## 2012-07-26 NOTE — Assessment & Plan Note (Signed)
Tolerating statin, obtain lft, flp.

## 2012-07-26 NOTE — Patient Instructions (Addendum)
You will be contact about your referral for colonoscopy.  Please let us know if you have not heard back within 1 week about your referral. Complete your blood work prior to leaving. Please follow up in 3 months for a medicare wellness visit.

## 2012-07-26 NOTE — Assessment & Plan Note (Signed)
Clinically stable on synthroid, obtain tsh. 

## 2012-07-26 NOTE — Progress Notes (Signed)
Subjective:    Patient ID: Madeline Schaefer, female    DOB: 11-23-1945, 67 y.o.   MRN: [id number]  HPI  Madeline Schaefer is a 67 yr old female who presents today for follow up.  Cholesterol- she continues atorvastatin without myalgia.  Thyroid- reports feeling well on current dose of levothyroxine.  Requesting ketoconazole shampoo refill for dandruff and refill on flexeril.    Review of Systems See HPI  Past Medical History  Diagnosis Date  . CAD (coronary artery disease)     s/p stent -- s/p PPm  . HLD (hyperlipidemia)   . Hypothyroidism   . Mobitz type 2 second degree heart block     s/p PPM by Dr Juanda Chance 2004  . PACEMAKER, PERMANENT   . DYSPNEA   . BRONCHITIS     History   Social History  . Marital Status: Married    Spouse Name: N/A    Number of Children: 0  . Years of Education: N/A   Occupational History  . Not on file.   Social History Main Topics  . Smoking status: Never Smoker   . Smokeless tobacco: Not on file  . Alcohol Use: No  . Drug Use: Not on file  . Sexually Active: Not on file   Other Topics Concern  . 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    Past Surgical History  Procedure Date  . Pacemaker insertion 2004, 12/22/11    initial implant by Dr Juanda Chance, Generator change (MDT Adapta L) by Dr Johney Frame 6/13  . Total abdominal hysterectomy w/ bilateral salpingoophorectomy   . Myomectomy     Family History  Problem Relation Age of Onset  . Prostate cancer    . Heart attack      Allergies  Allergen Reactions  . Codeine Other (See Comments)    REACTION: Passed  out, facial swelling and hives  . Sulfonamide Derivatives Hives    Current Outpatient Prescriptions on File Prior to Visit  Medication Sig Dispense Refill  . aspirin EC 81 MG tablet Take 81 mg by mouth daily.      Marland Kitchen atorvastatin (LIPITOR) 80 MG tablet Take 40 mg by mouth daily.        . Cholecalciferol 2000 UNITS CAPS  Take 4,000 Units by mouth daily.      . fish oil-omega-3 fatty acids 1000 MG capsule Take 2 g by mouth daily.       . Multiple Vitamin (MULTIVITAMIN) tablet Take 1 tablet by mouth daily.          BP 120/80  Pulse 68  Temp 98 F (36.7 C) (Oral)  Resp 16  Ht 5\' 10"  (1.778 m)  Wt 170 lb 1.9 oz (77.166 kg)  BMI 24.41 kg/m2  SpO2 98%  LMP 10/17/1986       Objective:   Physical Exam  Constitutional: She is oriented to person, place, and time. She appears well-developed and well-nourished. No distress.  Cardiovascular: Normal rate and regular rhythm.   No murmur heard. Pulmonary/Chest: Effort normal and breath sounds normal. No respiratory distress. She has no wheezes. She has no rales. She exhibits no tenderness.  Musculoskeletal: She exhibits no edema.  Neurological: She is alert and oriented to person, place, and time.  Skin: Skin is warm and dry.  Psychiatric: She has a normal mood and affect. Her behavior is normal. Judgment and thought content normal.  Assessment & Plan:

## 2012-07-27 LAB — VITAMIN D 25 HYDROXY (VIT D DEFICIENCY, FRACTURES): Vit D, 25-Hydroxy: 50 ng/mL (ref 30–89)

## 2012-07-29 ENCOUNTER — Encounter: Payer: Self-pay | Admitting: Family

## 2012-07-30 ENCOUNTER — Encounter: Payer: Self-pay | Admitting: Internal Medicine

## 2012-08-01 ENCOUNTER — Telehealth: Payer: Self-pay | Admitting: Family

## 2012-08-01 ENCOUNTER — Telehealth: Payer: Self-pay | Admitting: *Deleted

## 2012-08-01 MED ORDER — LEVOTHYROXINE SODIUM 88 MCG PO CAPS: 1.0000 | ORAL_CAPSULE | Freq: Every day | ORAL | Status: DC

## 2012-08-01 MED ORDER — ATORVASTATIN CALCIUM 80 MG PO TABS: 40.0000 mg | ORAL_TABLET | Freq: Every day | ORAL | Status: DC

## 2012-08-01 NOTE — Telephone Encounter (Signed)
Patient is requesting a refill of lipitor 80mg (take one daily) and levothyroxine  Also, patient would like a callback regarding her lab results

## 2012-08-01 NOTE — Addendum Note (Signed)
Addended by: Regis Bill on: 08/01/2012 03:57 PM   Modules accepted: Orders

## 2012-08-01 NOTE — Telephone Encounter (Signed)
Madeline Schaefer 08/01/2012 2:13 PM Signed  patient would like a callback regarding her lab results

## 2012-08-01 NOTE — Telephone Encounter (Signed)
Rx to pharmacy/SLS 

## 2012-08-02 NOTE — Telephone Encounter (Signed)
Notified pt and she states she has now gone back to 1/2 tablet (40mg ) a day to bring her LDL down again. Also states the she is going out of town for a couple of months in February and will call us when she returns to scheduled her Medicare Wellness Exam.

## 2012-08-09 ENCOUNTER — Encounter: Payer: Self-pay | Admitting: Cardiology

## 2012-08-09 ENCOUNTER — Ambulatory Visit (INDEPENDENT_AMBULATORY_CARE_PROVIDER_SITE_OTHER): Payer: Medicare Other | Admitting: Cardiology

## 2012-08-09 VITALS — BP 127/71 | HR 64 | Ht 70.0 in | Wt 172.0 lb

## 2012-08-09 DIAGNOSIS — Z950 Presence of cardiac pacemaker: Secondary | ICD-10-CM

## 2012-08-09 DIAGNOSIS — E785 Hyperlipidemia, unspecified: Secondary | ICD-10-CM | POA: Diagnosis not present

## 2012-08-09 DIAGNOSIS — I2510 Atherosclerotic heart disease of native coronary artery without angina pectoris: Secondary | ICD-10-CM

## 2012-08-09 DIAGNOSIS — E039 Hypothyroidism, unspecified: Secondary | ICD-10-CM | POA: Diagnosis not present

## 2012-08-09 NOTE — Assessment & Plan Note (Signed)
No current symptoms at the present time.  Will continue to recommend continued medical follow up.

## 2012-08-09 NOTE — Progress Notes (Signed)
HPI:  Th the patient returns in followup. Overall, she continues to do extremely well. She and her husband are getting ready to go on a long trip, something they have done in the past. She denies chest pain shortness of breath or other progressive symptoms. She's done well since her pacemaker replacement.  Current Outpatient Prescriptions  Medication Sig Dispense Refill  . aspirin EC 81 MG tablet Take 81 mg by mouth daily.      Marland Kitchen atorvastatin (LIPITOR) 80 MG tablet Take 0.5 tablets (40 mg total) by mouth daily.  45 tablet  0  . Cholecalciferol (VITAMIN D-3 PO) 1,000 Units. Take 2 tablets by mouth daily      . cyclobenzaprine (FLEXERIL) 5 MG tablet Take 1 tablet (5 mg total) by mouth 3 (three) times daily as needed for muscle spasms.  30 tablet  0  . fish oil-omega-3 fatty acids 1000 MG capsule Take 2 g by mouth daily.       Marland Kitchen ketoconazole (NIZORAL) 2 % shampoo Apply topically 2 (two) times a week.  120 mL  5  . Levothyroxine Sodium 88 MCG CAPS Take 1 capsule (88 mcg total) by mouth daily.  30 capsule  2  . Multiple Vitamin (MULTIVITAMIN) tablet Take 1 tablet by mouth daily.          Allergies  Allergen Reactions  . Codeine Other (See Comments)    REACTION: Passed  out, facial swelling and hives  . Sulfonamide Derivatives Hives    Past Medical History  Diagnosis Date  . CAD (coronary artery disease)     s/p stent -- s/p PPm  . HLD (hyperlipidemia)   . Hypothyroidism   . Mobitz type 2 second degree heart block     s/p PPM by Dr Juanda Chance 2004  . PACEMAKER, PERMANENT   . DYSPNEA   . BRONCHITIS     Past Surgical History  Procedure Date  . Pacemaker insertion 2004, 12/22/11    initial implant by Dr Juanda Chance, Generator change (MDT Adapta L) by Dr Johney Frame 6/13  . Total abdominal hysterectomy w/ bilateral salpingoophorectomy   . Myomectomy     Family History  Problem Relation Age of Onset  . Prostate cancer    . Heart attack      History   Social History  . Marital Status:  Married    Spouse Name: N/A    Number of Children: 0  . Years of Education: N/A   Occupational History  . Not on file.   Social History Main Topics  . Smoking status: Never Smoker   . Smokeless tobacco: Not on file  . Alcohol Use: No  . Drug Use: Not on file  . Sexually Active: Not on file   Other Topics Concern  . 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    ROS: Please see the HPI.  All other systems reviewed and negative.  PHYSICAL EXAM:  BP 127/71  Pulse 64  Ht 5\' 10"  (1.778 m)  Wt 172 lb (78.019 kg)  BMI 24.68 kg/m2  SpO2 98%  LMP 10/17/1986  General: Well developed, well nourished, in no acute distress. Head:  Normocephalic and atraumatic. Neck: no JVD.  Pacer site looks good.   Lungs: Clear to auscultation and percussion. Heart: Normal S1 and S2.  No murmur, rubs or gallops.  Pulses: Pulses normal in all 4 extremities. Extremities: No clubbing or cyanosis. No edema. Neurologic:  Alert and oriented x 3.  EKG:  Atrial tracking and ventricular pacing.    ASSESSMENT AND PLAN:

## 2012-08-09 NOTE — Assessment & Plan Note (Signed)
Recent TSH was ok ?

## 2012-08-09 NOTE — Patient Instructions (Signed)
Your physician wants you to follow-up in: 1 YEAR with Dr Antoine Poche. You will receive a reminder letter in the mail two months in advance. If you don't receive a letter, please call our office to schedule the follow-up appointment.  Your physician recommends that you continue on your current medications as directed. Please refer to the Current Medication list given to you today.

## 2012-08-09 NOTE — Assessment & Plan Note (Signed)
Not quite at target.  Being followed by primary care.  Currently on fairly good doses of atorvastatin.

## 2012-08-09 NOTE — Assessment & Plan Note (Signed)
Followed by Dr. Allred. 

## 2012-08-12 DIAGNOSIS — M899 Disorder of bone, unspecified: Secondary | ICD-10-CM | POA: Diagnosis not present

## 2012-08-12 DIAGNOSIS — M949 Disorder of cartilage, unspecified: Secondary | ICD-10-CM | POA: Diagnosis not present

## 2012-08-12 LAB — HM DEXA SCAN: HM Dexa Scan: NORMAL

## 2012-08-19 ENCOUNTER — Encounter (HOSPITAL_BASED_OUTPATIENT_CLINIC_OR_DEPARTMENT_OTHER): Payer: Self-pay | Admitting: *Deleted

## 2012-08-19 ENCOUNTER — Encounter (HOSPITAL_BASED_OUTPATIENT_CLINIC_OR_DEPARTMENT_OTHER)
Admission: RE | Disposition: A | Discharge status: Home / Self Care | Payer: Self-pay | Source: Ambulatory Visit | Attending: Urology

## 2012-08-19 ENCOUNTER — Ambulatory Visit (HOSPITAL_BASED_OUTPATIENT_CLINIC_OR_DEPARTMENT_OTHER)
Admission: RE | Admit: 2012-08-19 | Discharge: 2012-08-19 | Disposition: A | Discharge status: Home / Self Care | Payer: Medicare Other | Source: Ambulatory Visit | Attending: Urology | Admitting: Urology

## 2012-08-19 ENCOUNTER — Ambulatory Visit (HOSPITAL_BASED_OUTPATIENT_CLINIC_OR_DEPARTMENT_OTHER): Payer: Medicare Other | Admitting: Anesthesiology

## 2012-08-19 ENCOUNTER — Encounter (HOSPITAL_BASED_OUTPATIENT_CLINIC_OR_DEPARTMENT_OTHER): Payer: Self-pay | Admitting: Anesthesiology

## 2012-08-19 ENCOUNTER — Emergency Department (HOSPITAL_BASED_OUTPATIENT_CLINIC_OR_DEPARTMENT_OTHER)
Admission: EM | Admit: 2012-08-19 | Discharge: 2012-08-19 | Disposition: A | Discharge status: Home / Self Care | Payer: Medicare Other | Source: Home / Self Care | Attending: Emergency Medicine | Admitting: Emergency Medicine

## 2012-08-19 ENCOUNTER — Emergency Department (HOSPITAL_BASED_OUTPATIENT_CLINIC_OR_DEPARTMENT_OTHER): Payer: Medicare Other

## 2012-08-19 ENCOUNTER — Other Ambulatory Visit: Payer: Self-pay | Admitting: Urology

## 2012-08-19 DIAGNOSIS — E039 Hypothyroidism, unspecified: Secondary | ICD-10-CM | POA: Insufficient documentation

## 2012-08-19 DIAGNOSIS — N200 Calculus of kidney: Secondary | ICD-10-CM | POA: Insufficient documentation

## 2012-08-19 DIAGNOSIS — I2510 Atherosclerotic heart disease of native coronary artery without angina pectoris: Secondary | ICD-10-CM | POA: Insufficient documentation

## 2012-08-19 DIAGNOSIS — N201 Calculus of ureter: Secondary | ICD-10-CM | POA: Insufficient documentation

## 2012-08-19 DIAGNOSIS — E785 Hyperlipidemia, unspecified: Secondary | ICD-10-CM | POA: Insufficient documentation

## 2012-08-19 DIAGNOSIS — Z950 Presence of cardiac pacemaker: Secondary | ICD-10-CM | POA: Insufficient documentation

## 2012-08-19 DIAGNOSIS — Z79899 Other long term (current) drug therapy: Secondary | ICD-10-CM | POA: Insufficient documentation

## 2012-08-19 DIAGNOSIS — N135 Crossing vessel and stricture of ureter without hydronephrosis: Secondary | ICD-10-CM | POA: Diagnosis not present

## 2012-08-19 DIAGNOSIS — Z8679 Personal history of other diseases of the circulatory system: Secondary | ICD-10-CM | POA: Insufficient documentation

## 2012-08-19 DIAGNOSIS — R110 Nausea: Secondary | ICD-10-CM | POA: Insufficient documentation

## 2012-08-19 DIAGNOSIS — Z7982 Long term (current) use of aspirin: Secondary | ICD-10-CM | POA: Insufficient documentation

## 2012-08-19 DIAGNOSIS — Z9861 Coronary angioplasty status: Secondary | ICD-10-CM | POA: Insufficient documentation

## 2012-08-19 DIAGNOSIS — Z8709 Personal history of other diseases of the respiratory system: Secondary | ICD-10-CM | POA: Insufficient documentation

## 2012-08-19 HISTORY — PX: CYSTOSCOPY WITH RETROGRADE PYELOGRAM, URETEROSCOPY AND STENT PLACEMENT: SHX5789

## 2012-08-19 LAB — CBC WITH DIFFERENTIAL/PLATELET
Basophils Absolute: 0 10*3/uL (ref 0.0–0.1)
Basophils Relative: 1 % (ref 0–1)
Eosinophils Absolute: 0.3 10*3/uL (ref 0.0–0.7)
Eosinophils Relative: 4 % (ref 0–5)
HCT: 42.4 % (ref 36.0–46.0)
MCH: 29.7 pg (ref 26.0–34.0)
MCHC: 33.5 g/dL (ref 30.0–36.0)
MCV: 88.7 fL (ref 78.0–100.0)
Monocytes Absolute: 0.6 10*3/uL (ref 0.1–1.0)
Platelets: 217 10*3/uL (ref 150–400)
RDW: 13.2 % (ref 11.5–15.5)
WBC: 7.1 10*3/uL (ref 4.0–10.5)

## 2012-08-19 LAB — COMPREHENSIVE METABOLIC PANEL
ALT: 21 U/L (ref 0–35)
AST: 22 U/L (ref 0–37)
Calcium: 9.2 mg/dL (ref 8.4–10.5)
Creatinine, Ser: 0.8 mg/dL (ref 0.50–1.10)
GFR calc non Af Amer: 75 mL/min — ABNORMAL LOW (ref >90)
Sodium: 139 mEq/L (ref 135–145)
Total Protein: 7.3 g/dL (ref 6.0–8.3)

## 2012-08-19 LAB — URINALYSIS, ROUTINE W REFLEX MICROSCOPIC
Bilirubin Urine: NEGATIVE
Nitrite: NEGATIVE
Specific Gravity, Urine: 1.018 (ref 1.005–1.030)
Urobilinogen, UA: 0.2 mg/dL (ref 0.0–1.0)
pH: 7 (ref 5.0–8.0)

## 2012-08-19 LAB — URINE MICROSCOPIC-ADD ON

## 2012-08-19 SURGERY — CYSTOURETEROSCOPY, WITH RETROGRADE PYELOGRAM AND STENT INSERTION
Anesthesia: General | Site: Ureter | Laterality: Right | Wound class: Clean Contaminated

## 2012-08-19 MED ORDER — PHENAZOPYRIDINE HCL 100 MG PO TABS: 100.0000 mg | ORAL_TABLET | Freq: Three times a day (TID) | ORAL | Status: DC | PRN

## 2012-08-19 MED ORDER — ONDANSETRON 8 MG PO TBDP
8.0000 mg | ORAL_TABLET | Freq: Once | ORAL | Status: AC
  Administered 2012-08-19: 8 mg via ORAL
  Filled 2012-08-19: qty 1

## 2012-08-19 MED ORDER — KETOROLAC TROMETHAMINE 30 MG/ML IJ SOLN
30.0000 mg | Freq: Once | INTRAMUSCULAR | Status: AC
  Administered 2012-08-19: 30 mg via INTRAVENOUS
  Filled 2012-08-19: qty 1

## 2012-08-19 MED ORDER — MIDAZOLAM HCL 5 MG/5ML IJ SOLN
INTRAMUSCULAR | Status: DC | PRN
  Administered 2012-08-19: 2 mg via INTRAVENOUS

## 2012-08-19 MED ORDER — PROMETHAZINE HCL 25 MG/ML IJ SOLN
6.2500 mg | INTRAMUSCULAR | Status: DC | PRN
  Filled 2012-08-19: qty 1

## 2012-08-19 MED ORDER — FENTANYL CITRATE 0.05 MG/ML IJ SOLN
INTRAMUSCULAR | Status: DC | PRN
  Administered 2012-08-19 (×2): 25 ug via INTRAVENOUS
  Administered 2012-08-19: 50 ug via INTRAVENOUS

## 2012-08-19 MED ORDER — OXYBUTYNIN CHLORIDE 5 MG PO TABS: 5.0000 mg | ORAL_TABLET | Freq: Four times a day (QID) | ORAL | Status: DC | PRN

## 2012-08-19 MED ORDER — LIDOCAINE HCL 2 % EX GEL
CUTANEOUS | Status: DC | PRN
  Administered 2012-08-19: 1

## 2012-08-19 MED ORDER — ONDANSETRON HCL 4 MG/2ML IJ SOLN
4.0000 mg | Freq: Once | INTRAMUSCULAR | Status: AC
  Administered 2012-08-19: 4 mg via INTRAVENOUS
  Filled 2012-08-19: qty 2

## 2012-08-19 MED ORDER — FENTANYL CITRATE 0.05 MG/ML IJ SOLN
50.0000 ug | Freq: Once | INTRAMUSCULAR | Status: AC
  Administered 2012-08-19: 50 ug via INTRAVENOUS
  Filled 2012-08-19: qty 1

## 2012-08-19 MED ORDER — FENTANYL CITRATE 0.05 MG/ML IJ SOLN
100.0000 ug | Freq: Once | INTRAMUSCULAR | Status: AC
  Administered 2012-08-19: 100 ug via INTRAVENOUS
  Filled 2012-08-19: qty 2

## 2012-08-19 MED ORDER — HYOSCYAMINE SULFATE 0.125 MG PO TABS: 0.1250 mg | ORAL_TABLET | ORAL | Status: DC | PRN

## 2012-08-19 MED ORDER — CIPROFLOXACIN HCL 500 MG PO TABS: 500.0000 mg | ORAL_TABLET | Freq: Two times a day (BID) | ORAL | Status: DC

## 2012-08-19 MED ORDER — PROPOFOL 10 MG/ML IV BOLUS
INTRAVENOUS | Status: DC | PRN
  Administered 2012-08-19: 100 mg via INTRAVENOUS

## 2012-08-19 MED ORDER — SENNOSIDES-DOCUSATE SODIUM 8.6-50 MG PO TABS: 1.0000 | ORAL_TABLET | Freq: Two times a day (BID) | ORAL | Status: DC

## 2012-08-19 MED ORDER — DEXAMETHASONE SODIUM PHOSPHATE 4 MG/ML IJ SOLN
INTRAMUSCULAR | Status: DC | PRN
  Administered 2012-08-19: 8 mg via INTRAVENOUS

## 2012-08-19 MED ORDER — FENTANYL CITRATE 0.05 MG/ML IJ SOLN
25.0000 ug | INTRAMUSCULAR | Status: DC | PRN
  Filled 2012-08-19: qty 1

## 2012-08-19 MED ORDER — LIDOCAINE HCL (CARDIAC) 20 MG/ML IV SOLN
INTRAVENOUS | Status: DC | PRN
  Administered 2012-08-19: 60 mg via INTRAVENOUS

## 2012-08-19 MED ORDER — PROMETHAZINE HCL 25 MG/ML IJ SOLN
12.5000 mg | Freq: Four times a day (QID) | INTRAMUSCULAR | Status: DC | PRN
  Administered 2012-08-19: 12.5 mg via INTRAVENOUS
  Filled 2012-08-19: qty 1

## 2012-08-19 MED ORDER — LACTATED RINGERS IV SOLN
INTRAVENOUS | Status: DC
  Administered 2012-08-19 (×2): via INTRAVENOUS
  Filled 2012-08-19: qty 1000

## 2012-08-19 MED ORDER — IOHEXOL 350 MG/ML SOLN
INTRAVENOUS | Status: DC | PRN
  Administered 2012-08-19: 5 mL via INTRAVENOUS

## 2012-08-19 MED ORDER — ONDANSETRON 8 MG PO TBDP: ORAL_TABLET | ORAL | Status: DC

## 2012-08-19 MED ORDER — ALFUZOSIN HCL ER 10 MG PO TB24: 10.0000 mg | ORAL_TABLET | Freq: Every day | ORAL | Status: DC

## 2012-08-19 MED ORDER — OXYCODONE-ACETAMINOPHEN 5-325 MG PO TABS: 1.0000 | ORAL_TABLET | Freq: Four times a day (QID) | ORAL | Status: DC | PRN

## 2012-08-19 MED ORDER — SUCCINYLCHOLINE CHLORIDE 20 MG/ML IJ SOLN
INTRAMUSCULAR | Status: DC | PRN
  Administered 2012-08-19: 80 mg via INTRAVENOUS

## 2012-08-19 MED ORDER — LACTATED RINGERS IV SOLN
INTRAVENOUS | Status: DC
  Filled 2012-08-19: qty 1000

## 2012-08-19 MED ORDER — OXYCODONE-ACETAMINOPHEN 5-325 MG PO TABS: 1.0000 | ORAL_TABLET | ORAL | Status: DC | PRN

## 2012-08-19 MED ORDER — SODIUM CHLORIDE 0.9 % IR SOLN
Status: DC | PRN
  Administered 2012-08-19: 6000 mL via INTRAVESICAL

## 2012-08-19 MED ORDER — CEFAZOLIN SODIUM-DEXTROSE 2-3 GM-% IV SOLR
2.0000 g | INTRAVENOUS | Status: AC
  Administered 2012-08-19: 2 g via INTRAVENOUS
  Filled 2012-08-19: qty 50

## 2012-08-19 MED ORDER — KETOROLAC TROMETHAMINE 30 MG/ML IJ SOLN
INTRAMUSCULAR | Status: DC | PRN
  Administered 2012-08-19: 30 mg via INTRAVENOUS

## 2012-08-19 MED ORDER — TAMSULOSIN HCL 0.4 MG PO CAPS: 0.4000 mg | ORAL_CAPSULE | Freq: Every day | ORAL | Status: DC

## 2012-08-19 MED ORDER — BELLADONNA ALKALOIDS-OPIUM 16.2-60 MG RE SUPP
RECTAL | Status: DC | PRN
  Administered 2012-08-19: 1 via RECTAL

## 2012-08-19 MED ORDER — ONDANSETRON HCL 4 MG/2ML IJ SOLN
INTRAMUSCULAR | Status: DC | PRN
  Administered 2012-08-19: 4 mg via INTRAVENOUS

## 2012-08-19 SURGICAL SUPPLY — 35 items
ADAPTER CATH URET PLST 4-6FR (CATHETERS) ×3 IMPLANT
BAG DRAIN URO-CYSTO SKYTR STRL (DRAIN) ×3 IMPLANT
BASKET LASER NITINOL 1.9FR (BASKET) IMPLANT
BASKET STNLS GEMINI 4WIRE 3FR (BASKET) IMPLANT
BASKET ZERO TIP NITINOL 2.4FR (BASKET) ×3 IMPLANT
BRUSH URET BIOPSY 3F (UROLOGICAL SUPPLIES) IMPLANT
CANISTER SUCT LVC 12 LTR MEDI- (MISCELLANEOUS) ×3 IMPLANT
CATH CLEAR GEL 3F BACKSTOP (CATHETERS) ×3 IMPLANT
CATH INTERMIT  6FR 70CM (CATHETERS) ×3 IMPLANT
CATH URET 5FR 28IN CONE TIP (BALLOONS)
CATH URET 5FR 28IN OPEN ENDED (CATHETERS) ×3 IMPLANT
CATH URET 5FR 70CM CONE TIP (BALLOONS) IMPLANT
CLOTH BEACON ORANGE TIMEOUT ST (SAFETY) ×3 IMPLANT
DRAPE CAMERA CLOSED 9X96 (DRAPES) ×3 IMPLANT
ELECT REM PT RETURN 9FT ADLT (ELECTROSURGICAL)
ELECTRODE REM PT RTRN 9FT ADLT (ELECTROSURGICAL) IMPLANT
GLOVE BIO SURGEON STRL SZ7 (GLOVE) ×3 IMPLANT
GLOVE INDICATOR 7.0 STRL GRN (GLOVE) ×3 IMPLANT
GLOVE INDICATOR 7.5 STRL GRN (GLOVE) ×3 IMPLANT
GOWN PREVENTION PLUS LG XLONG (DISPOSABLE) ×3 IMPLANT
GUIDEWIRE 0.038 PTFE COATED (WIRE) IMPLANT
GUIDEWIRE ANG ZIPWIRE 038X150 (WIRE) IMPLANT
GUIDEWIRE STR DUAL SENSOR (WIRE) ×3 IMPLANT
IV NS IRRIG 3000ML ARTHROMATIC (IV SOLUTION) ×6 IMPLANT
KIT BALLIN UROMAX 15FX10 (LABEL) IMPLANT
KIT BALLN UROMAX 15FX4 (MISCELLANEOUS) IMPLANT
KIT BALLN UROMAX 26 75X4 (MISCELLANEOUS)
LASER FIBER DISP (UROLOGICAL SUPPLIES) ×3 IMPLANT
PACK CYSTOSCOPY (CUSTOM PROCEDURE TRAY) ×3 IMPLANT
SET HIGH PRES BAL DIL (LABEL)
SHEATH ACCESS URETERAL 38CM (SHEATH) IMPLANT
SHEATH ACCESS URETERAL 54CM (SHEATH) IMPLANT
STENT CONTOUR 6FRX24X.038 (STENTS) IMPLANT
STENT URET 6FRX24 CONTOUR (STENTS) ×3 IMPLANT
SYRINGE IRR TOOMEY STRL 70CC (SYRINGE) IMPLANT

## 2012-08-19 NOTE — Anesthesia Preprocedure Evaluation (Signed)
Anesthesia Evaluation  Patient identified by MRN, date of birth, ID band Patient awake    Reviewed: Allergy & Precautions, H&P , NPO status , Patient's Chart, lab work & pertinent test results  Airway Mallampati: II TM Distance: >3 FB Neck ROM: Full    Dental  (+) Teeth Intact and Dental Advisory Given   Pulmonary neg pulmonary ROS, shortness of breath,  breath sounds clear to auscultation  Pulmonary exam normal       Cardiovascular - CAD + dysrhythmias + pacemaker Rate:Normal     Neuro/Psych negative neurological ROS  negative psych ROS   GI/Hepatic negative GI ROS, Neg liver ROS,   Endo/Other  negative endocrine ROSHypothyroidism   Renal/GU negative Renal ROS  negative genitourinary   Musculoskeletal negative musculoskeletal ROS (+)   Abdominal   Peds  Hematology negative hematology ROS (+)   Anesthesia Other Findings   Reproductive/Obstetrics negative OB ROS                           Anesthesia Physical Anesthesia Plan  ASA: III  Anesthesia Plan: General   Post-op Pain Management:    Induction: Intravenous  Airway Management Planned: LMA  Additional Equipment:   Intra-op Plan:   Post-operative Plan:   Informed Consent: I have reviewed the patients History and Physical, chart, labs and discussed the procedure including the risks, benefits and alternatives for the proposed anesthesia with the patient or authorized representative who has indicated his/her understanding and acceptance.   Dental advisory given  Plan Discussed with: CRNA  Anesthesia Plan Comments:         Anesthesia Quick Evaluation

## 2012-08-19 NOTE — ED Notes (Signed)
Patient with c/o Mid right lower abdominal area that started about an hour and half ago.  Pain is sharp and intermittent in nature.  Patient also mentions chills but denies fever or urinary symptoms

## 2012-08-19 NOTE — H&P (Signed)
Urology History and Physical Exam  CC: Right ureter stone  HPI: 67 year old female with a right ureter stone. This became apparent early her this morning when she began to have right flank pain. She presented to the ER were CT scan was performed today. This revealed a right distal ureter stone. It is 4 mm in size. It is associated with hydroureter and hydronephrosis. There is also a right midpole.shunting 3 mm stone. She has had severe pain and intractable nausea. Whenever she tries to take pain medications she vomits. She denies fever. She has had chills. UA in the ER today is positive for small leukocyte esterase, negative for nitrites, WBCs 3-6, and RBCs 11-20. Urine cultures pending. We discussed the risk and benefits of treatment options and she presents today for cystoscopy, right retrograde pyelogram, possible right ureteroscopy, possible right laser lithotripsy, possible right ureter stent placement. We have discussed the alternatives and the likelihood of achieving goals. She understands that if an excessive amount of manipulation was required and I will likely leave a stent in place and return of the later date to treat the stone given the unknown status of her urine culture.  PMH: Past Medical History  Diagnosis Date  . HLD (hyperlipidemia)   . Hypothyroidism   . Mobitz type 2 second degree heart block     s/p PPM by Dr Juanda Chance 2004  . PACEMAKER, PERMANENT   . DYSPNEA   . BRONCHITIS   . CAD (coronary artery disease) cardiologist- dr Riley Kill    s/p stent -- s/p PPm    PSH: Past Surgical History  Procedure Date  . Pacemaker insertion 2004, 12/22/11    initial implant by Dr Juanda Chance, Generator change (MDT Adapta L) by Dr Johney Frame 6/13  . Total abdominal hysterectomy w/ bilateral salpingoophorectomy   . Myomectomy   . Insert / replace / remove pacemaker   . Cardiac catheterization     Allergies: Allergies  Allergen Reactions  . Codeine Other (See Comments)    REACTION: Passed   out, facial swelling and hives  . Sulfonamide Derivatives Hives    Medications: Prescriptions prior to admission  Medication Sig Dispense Refill  . aspirin EC 81 MG tablet Take 81 mg by mouth daily.      Marland Kitchen atorvastatin (LIPITOR) 80 MG tablet Take 0.5 tablets (40 mg total) by mouth daily.  45 tablet  0  . Cholecalciferol (VITAMIN D-3 PO) 1,000 Units. Take 2 tablets by mouth daily      . fish oil-omega-3 fatty acids 1000 MG capsule Take 2 g by mouth daily.       Marland Kitchen ketoconazole (NIZORAL) 2 % shampoo Apply topically 2 (two) times a week.  120 mL  5  . Levothyroxine Sodium 88 MCG CAPS Take 1 capsule (88 mcg total) by mouth daily.  30 capsule  2  . Multiple Vitamin (MULTIVITAMIN) tablet Take 1 tablet by mouth daily.        . cyclobenzaprine (FLEXERIL) 5 MG tablet Take 1 tablet (5 mg total) by mouth 3 (three) times daily as needed for muscle spasms.  30 tablet  0  . ondansetron (ZOFRAN ODT) 8 MG disintegrating tablet 8mg  ODT q8 hours prn nausea  12 tablet  0  . oxyCODONE-acetaminophen (PERCOCET) 5-325 MG per tablet Take 1 tablet by mouth every 6 (six) hours as needed for pain.  15 tablet  0  . Tamsulosin HCl (FLOMAX) 0.4 MG CAPS Take 1 capsule (0.4 mg total) by mouth daily.  7 capsule  0     Social History: History   Social History  . Marital Status: Married    Spouse Name: N/A    Number of Children: 0  . Years of Education: N/A   Occupational History  . Not on file.   Social History Main Topics  . Smoking status: Never Smoker   . Smokeless tobacco: Not on file  . Alcohol Use: No  . Drug Use: Not on file  . Sexually Active: Yes   Other Topics Concern  . 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    Family History: Family History  Problem Relation Age of Onset  . Prostate cancer    . Heart attack    . Heart attack Mother   . Prostate cancer Father     Review of Systems: Positive: Nausea, right flank pain,  emesis. Negative: Chest pain, SOB, weight loss.  A further 10 point review of systems was negative except what is listed in the HPI.  Physical Exam: Filed Vitals:   08/19/12 1157  BP: 148/76  Pulse: 63  Temp: 97.8 F (36.6 C)  Resp: 18    General: No acute distress.  Awake. Head:  Normocephalic.  Atraumatic. ENT:  EOMI.  Mucous membranes moist Neck:  Supple.  No lymphadenopathy. Pulmonary: Equal effort bilaterally.  Clear to auscultation bilaterally. Abdomen: Soft.  Non- tender to palpation. Skin:  Normal turgor.  No visible rash. Extremity: No gross deformity of bilateral upper extremities.  No gross deformity of    bilateral lower extremities. Neurologic: Alert. Appropriate mood.    Studies:  Recent Labs  [facility] 08/19/12 0527   HGB 14.2   WBC 7.1   PLT 217    Recent Labs  Basename 08/19/12 0527   NA 139   K 3.7   CL 101   CO2 24   BUN 25*   CREATININE 0.80   CALCIUM 9.2   GFRNONAA 75*   GFRAA 87*     No results found for this basename: PT:2,INR:2,APTT:2 in the last 72 hours   No components found with this basename: ABG:2    Assessment:  Right distal ureter stone with obstruction.  Plan: Proceed to the operating room for cystoscopy, right retrograde pyelogram, right ureteroscopy, laser lithotripsy, possible right ureter stent placement.  She will be given Ancef IV for antibiotic prophylaxis.

## 2012-08-19 NOTE — Transfer of Care (Signed)
Immediate Anesthesia Transfer of Care Note  Patient: Madeline Schaefer  Procedure(s) Performed: Procedure(s) (LRB): CYSTOSCOPY WITH RETROGRADE PYELOGRAM, URETEROSCOPY AND STENT PLACEMENT (Right)  Patient Location: PACU  Anesthesia Type: General  Level of Consciousness: awake, oriented, sedated and patient cooperative  Airway & Oxygen Therapy: Patient Spontanous Breathing and Patient connected to face mask oxygen  Post-op Assessment: Report given to PACU RN and Post -op Vital signs reviewed and stable  Post vital signs: Reviewed and stable  Complications: No apparent anesthesia complications

## 2012-08-19 NOTE — Op Note (Signed)
Urology Operative Report  Date of Procedure: 08/19/12  Surgeon: Natalia Leatherwood, MD Assistant: None  Preoperative Diagnosis: Right ureter stone Postoperative Diagnosis:  Right ureter stone. Right distal ureter stenosis.  Procedure(s): Cystoscopy Right ureteroscopy Laser lithotripsy Basket stone-retrieval Right retrograde pyelogram Right distal ureter dilate Right ureter stent placement   Estimated blood loss: Minimal  Specimen: Stones sent for chemical analysis at [facility].  Drains: None  Complications: None  Findings: Right distal ureter stenosis. Right distal ureter stone.  History of present illness: 67 year old female with a right ureter stone. She presented to the ER today in severe pain. I evaluated her in my clinic later in the day and we discussed management options. She elected to proceed with ureteroscopy on the right side.   Procedure in detail: After informed consent was obtained, the patient was taken to the operating room. They were placed in the supine position. SCDs were turned on and in place. IV antibiotics were infused, and general anesthesia was induced. A timeout was performed in which the correct patient, surgical site, and procedure were identified and agreed upon by the team.  The patient was placed in a dorsolithotomy position, making sure to pad all pertinent neurovascular pressure points. The genitals were prepped and draped in the usual sterile fashion.   Cystoscope was advanced through the urethra and into the bladder. The bladder was fully distended and evaluated in a systematic fashion with a 12 and 70 lens. There were no lesions or bladder tumors noted throughout the bladder. The left ureter orifice was effluxing clear yellow urine, but the right ureter orifice was not effluxing any urine. I cannulated the right ureter orifice with a 5 [place] ureter catheter and injected contrast to obtain a retrograde pyelogram. There was noted to  be a right distal ureter narrowing with a filling defect in the distal ureter consistent with the stone seen on CT scan. The stone was not visible on fluoroscopy. There was proximal hydroureter and hydronephrosis. There were no other filling defects.  A sensor wire was cannulated through the right ureter orifice and up into the right renal pelvis on fluoroscopy. The patient was paralyzed. I then attempted semirigid ureteroscopy, but the stenosis of her distal ureter prevented me from accessing the ureter. I then placed a 10/12 obturator to the ureter access sheath over the wire under fluoroscopy. I was able to perform this with ease. The safety wire was then pinned to the drape. A semirigid ureteroscope was able to be passed through the distal ureter where the stone was immediately visible. The stone was grasped with a 0 tip Nitinol basket but could not be removed through the stenotic area despite the fact that it had been dilated. The stone was released and backstop gel was placed proximal to the stone. A 200  holmium laser filament was placed through the ureter scope and lithotripsy was carried out at 0.5 J and 20 Hz. The stone was fragmented successfully. A 0 tip Nitinol basket was able to be used to remove all the fragments. These were sent to the [facility] for chemical analysis. I replaced the ureteroscope and evaluated the ureter up into the mid to proximal ureter. There were no other stones visible. There was a wide dilation of the ureter. I evaluated the distal ureter where there had been dilation. There was noted to be mild coastal disruption, but muscularis tissue was intact. I elected to leave a stent.  The safety wire was loaded onto the a  rigid cystoscope. A 6 x 24 double-J ureter stent was placed up the wire with ease with a good curl deployed in the right renal pelvis on fluoroscopy and a curl seen in the bladder on direct visualization. No tethering strings were left in place. I  placed 10 cc of lidocaine jelly into her bladder after emptying her bladder, and placed a belladonna and opium suppository into her rectum (her allergy to codeine is rash, given the dilation of her ureter I feel that this is going to be very beneficial to her discomfort).   She was placed in a supine position. Anesthesia was reversed and she was taken to the PACU in stable condition.  She'll be discharged home with antibiotics , pain medication, and followup with me in 2 weeks for stent removal.

## 2012-08-19 NOTE — Anesthesia Procedure Notes (Signed)
Procedure Name: LMA Insertion Date/Time: 08/19/2012 2:11 PM Performed by: Renella Cunas D Pre-anesthesia Checklist: Patient identified, Emergency Drugs available, Suction available and Patient being monitored Patient Re-evaluated:Patient Re-evaluated prior to inductionOxygen Delivery Method: Circle System Utilized Preoxygenation: Pre-oxygenation with 100% oxygen Intubation Type: IV induction Ventilation: Mask ventilation without difficulty LMA: LMA with gastric port inserted LMA Size: 4.0 Number of attempts: 1 Airway Equipment and Method: bite block Placement Confirmation: positive ETCO2 Tube secured with: Tape Dental Injury: Teeth and Oropharynx as per pre-operative assessment

## 2012-08-19 NOTE — ED Provider Notes (Signed)
History     CSN: [id number]  Arrival date & time 08/19/12  0506   None     Chief Complaint  Patient presents with  . Abdominal Pain    (Consider location/radiation/quality/duration/timing/severity/associated sxs/prior treatment) Patient is a 67 y.o. female presenting with abdominal pain. The history is provided by the patient.  Abdominal Pain The primary symptoms of the illness include abdominal pain and nausea. The primary symptoms of the illness do not include shortness of breath or dysuria. The current episode started 1 to 2 hours ago. The onset of the illness was sudden. The problem has been gradually improving.  The abdominal pain began 1 to 2 hours ago. The pain came on suddenly. The abdominal pain has been unchanged since its onset. The abdominal pain is located in the right flank. The abdominal pain radiates to the RLQ. The abdominal pain is relieved by nothing.  Nausea began today.  Risk factors for an acute abdominal problem include being elderly. Symptoms associated with the illness do not include constipation, urgency, hematuria, frequency or back pain.    Past Medical History  Diagnosis Date  . CAD (coronary artery disease)     s/p stent -- s/p PPm  . HLD (hyperlipidemia)   . Hypothyroidism   . Mobitz type 2 second degree heart block     s/p PPM by Dr Juanda Chance 2004  . PACEMAKER, PERMANENT   . DYSPNEA   . BRONCHITIS     Past Surgical History  Procedure Date  . Pacemaker insertion 2004, 12/22/11    initial implant by Dr Juanda Chance, Generator change (MDT Adapta L) by Dr Johney Frame 6/13  . Total abdominal hysterectomy w/ bilateral salpingoophorectomy   . Myomectomy     Family History  Problem Relation Age of Onset  . Prostate cancer    . Heart attack      History  Substance Use Topics  . Smoking status: Never Smoker   . Smokeless tobacco: Not on file  . Alcohol Use: No    OB History    Grav Para Term Preterm Abortions TAB SAB Ect Mult Living                   Review of Systems  Respiratory: Negative for shortness of breath.   Gastrointestinal: Positive for nausea and abdominal pain. Negative for constipation.  Genitourinary: Negative for dysuria, urgency, frequency and hematuria.  Musculoskeletal: Negative for back pain.  All other systems reviewed and are negative.    Allergies  Codeine and Sulfonamide derivatives  Home Medications   Current Outpatient Rx  Name  Route  Sig  Dispense  Refill  . ASPIRIN EC 81 MG PO TBEC   Oral   Take 81 mg by mouth daily.         . ATORVASTATIN CALCIUM 80 MG PO TABS   Oral   Take 0.5 tablets (40 mg total) by mouth daily.   45 tablet   0   . VITAMIN D-3 PO      1,000 Units. Take 2 tablets by mouth daily         . CYCLOBENZAPRINE HCL 5 MG PO TABS   Oral   Take 1 tablet (5 mg total) by mouth 3 (three) times daily as needed for muscle spasms.   30 tablet   0   . OMEGA-3 FATTY ACIDS 1000 MG PO CAPS   Oral   Take 2 g by mouth daily.          Marland Kitchen  KETOCONAZOLE 2 % EX SHAM   Topical   Apply topically 2 (two) times a week.   120 mL   5   . LEVOTHYROXINE SODIUM 88 MCG PO CAPS   Oral   Take 1 capsule (88 mcg total) by mouth daily.   30 capsule   2   . ONE-DAILY MULTI VITAMINS PO TABS   Oral   Take 1 tablet by mouth daily.             LMP 10/17/1986  Physical Exam  Constitutional: She is oriented to person, place, and time. She appears well-developed and well-nourished. No distress.  HENT:  Head: Normocephalic and atraumatic.  Mouth/Throat: Oropharynx is clear and moist.  Eyes: Conjunctivae normal are normal. Pupils are equal, round, and reactive to light.  Neck: Normal range of motion. Neck supple.  Cardiovascular: Normal rate, regular rhythm and intact distal pulses.   Pulmonary/Chest: Effort normal and breath sounds normal. She has no wheezes. She has no rales.  Abdominal: Soft. Bowel sounds are normal. There is no tenderness. There is no rebound and no guarding.   Musculoskeletal: Normal range of motion.  Neurological: She is alert and oriented to person, place, and time.  Skin: Skin is warm and dry.  Psychiatric: She has a normal mood and affect.    ED Course  Procedures (including critical care time)   Labs Reviewed  CBC WITH DIFFERENTIAL  COMPREHENSIVE METABOLIC PANEL  URINALYSIS, ROUTINE W REFLEX MICROSCOPIC   No results found.   No diagnosis found.    MDM  Kidney stone: will treat with percocet and give zofran.  Strainer provided follow up with urology in one week.       Jasmine Awe, MD 08/19/12 [id number]

## 2012-08-20 ENCOUNTER — Encounter (HOSPITAL_BASED_OUTPATIENT_CLINIC_OR_DEPARTMENT_OTHER): Payer: Self-pay | Admitting: Urology

## 2012-08-20 LAB — URINE CULTURE

## 2012-08-26 NOTE — Anesthesia Postprocedure Evaluation (Signed)
Anesthesia Post Note  Patient: Madeline Schaefer  Procedure(s) Performed: Procedure(s) (LRB): CYSTOSCOPY WITH RETROGRADE PYELOGRAM, URETEROSCOPY AND STENT PLACEMENT (Right)  Anesthesia type: General  Patient location: PACU  Post pain: Pain level controlled  Post assessment: Post-op Vital signs reviewed  Last Vitals:  Filed Vitals:   08/19/12 1658  BP: 148/78  Pulse: 65  Temp: 37 C  Resp: 18    Post vital signs: Reviewed  Level of consciousness: sedated  Complications: No apparent anesthesia complications

## 2012-08-29 ENCOUNTER — Ambulatory Visit: Payer: Medicare Other | Admitting: Internal Medicine

## 2012-09-02 DIAGNOSIS — R82998 Other abnormal findings in urine: Secondary | ICD-10-CM | POA: Diagnosis not present

## 2012-09-03 ENCOUNTER — Encounter: Payer: Self-pay | Admitting: Family

## 2012-09-09 ENCOUNTER — Telehealth: Payer: Self-pay | Admitting: Family

## 2012-09-09 NOTE — Telephone Encounter (Signed)
See mychart.  

## 2012-09-10 ENCOUNTER — Telehealth: Payer: Self-pay | Admitting: Family

## 2012-09-10 NOTE — Telephone Encounter (Signed)
Patient is requesting last bone density results. °

## 2012-09-12 NOTE — Telephone Encounter (Signed)
Results sent 2/24 via my chart

## 2012-09-13 NOTE — Telephone Encounter (Signed)
Notified pt and she states that she was told by a previous doctor that pt's with heart disease should not take more than 500mg  of calcium a day. Pt states she currently takes 500mg  and does not feel comfortable increasing supplement based on previous recommendations.

## 2012-09-13 NOTE — Telephone Encounter (Signed)
See my chart message

## 2012-10-21 ENCOUNTER — Telehealth: Payer: Self-pay | Admitting: Family

## 2012-10-21 MED ORDER — ATORVASTATIN CALCIUM 40 MG PO TABS: 40.0000 mg | ORAL_TABLET | Freq: Every day | ORAL | Status: DC

## 2012-10-21 MED ORDER — LEVOTHYROXINE SODIUM 88 MCG PO CAPS: 1.0000 | ORAL_CAPSULE | Freq: Every day | ORAL | Status: DC

## 2012-10-21 NOTE — Telephone Encounter (Signed)
Patient states that she needs a refill of synthroid and atorvastatin to be sent to [facility] on [address].   She says that atorvastatin was filled for 80mg  but she take 40mg ?

## 2012-10-21 NOTE — Telephone Encounter (Signed)
Refills sent to pharmacy. Please call pt to arrange Medicare Wellness visit as advised at 07/2012 office visit.

## 2012-10-21 NOTE — Telephone Encounter (Signed)
Left message informing patient of medication refills and that she needs to call to schedule a medicare wellness visit.

## 2012-10-23 ENCOUNTER — Telehealth: Payer: Self-pay | Admitting: *Deleted

## 2012-10-23 NOTE — Telephone Encounter (Signed)
Received call from pt stating she went to pick up levothyroxine refill and pharmacy told her we sent brand name and cost would be $27. Pt states she has been getting generic for $4. Advised pt that levothyroxine is generic but I would call pharmacy. Spoke with pharmacist, Tresa Endo and verified that they received our Rx from 10/21/12 but Rx was put on hold because pt had another rx in the system that was transferred from another pharmacy in [facility]. Advised them to fill rx from 10/21/12 and she states it processes for the correct amount ($4). Notified pt.

## 2012-10-28 DIAGNOSIS — N200 Calculus of kidney: Secondary | ICD-10-CM | POA: Diagnosis not present

## 2012-11-11 DIAGNOSIS — N200 Calculus of kidney: Secondary | ICD-10-CM | POA: Diagnosis not present

## 2012-11-18 DIAGNOSIS — N200 Calculus of kidney: Secondary | ICD-10-CM | POA: Diagnosis not present

## 2012-11-18 DIAGNOSIS — Z1231 Encounter for screening mammogram for malignant neoplasm of breast: Secondary | ICD-10-CM | POA: Diagnosis not present

## 2012-11-20 ENCOUNTER — Telehealth: Payer: Self-pay | Admitting: *Deleted

## 2012-11-20 ENCOUNTER — Ambulatory Visit (INDEPENDENT_AMBULATORY_CARE_PROVIDER_SITE_OTHER): Payer: Medicare Other | Admitting: Family

## 2012-11-20 ENCOUNTER — Encounter: Payer: Self-pay | Admitting: Family

## 2012-11-20 VITALS — BP 130/82 | HR 73 | Temp 98.7°F | Resp 16 | Ht 69.0 in | Wt 173.0 lb

## 2012-11-20 DIAGNOSIS — R319 Hematuria, unspecified: Secondary | ICD-10-CM | POA: Diagnosis not present

## 2012-11-20 DIAGNOSIS — I2510 Atherosclerotic heart disease of native coronary artery without angina pectoris: Secondary | ICD-10-CM | POA: Diagnosis not present

## 2012-11-20 DIAGNOSIS — R7309 Other abnormal glucose: Secondary | ICD-10-CM | POA: Diagnosis not present

## 2012-11-20 DIAGNOSIS — E785 Hyperlipidemia, unspecified: Secondary | ICD-10-CM

## 2012-11-20 DIAGNOSIS — E039 Hypothyroidism, unspecified: Secondary | ICD-10-CM | POA: Diagnosis not present

## 2012-11-20 DIAGNOSIS — Z0000 Encounter for general adult medical examination without abnormal findings: Secondary | ICD-10-CM

## 2012-11-20 DIAGNOSIS — R739 Hyperglycemia, unspecified: Secondary | ICD-10-CM | POA: Insufficient documentation

## 2012-11-20 DIAGNOSIS — L259 Unspecified contact dermatitis, unspecified cause: Secondary | ICD-10-CM

## 2012-11-20 DIAGNOSIS — L309 Dermatitis, unspecified: Secondary | ICD-10-CM

## 2012-11-20 MED ORDER — BETAMETHASONE DIPROPIONATE 0.05 % EX CREA: TOPICAL_CREAM | Freq: Two times a day (BID) | CUTANEOUS | Status: DC

## 2012-11-20 NOTE — Telephone Encounter (Signed)
Message copied by Kathi Simpers on Wed Nov 20, 2012  3:52 PM ------      Message from: O'SULLIVAN, [name]      Created: Wed Nov 20, 2012 10:03 AM       Could you pls cancel diprolene at [facility], pt changed mind on pharm tks ------

## 2012-11-20 NOTE — Assessment & Plan Note (Signed)
Clinically stable. Management per cardiology.  

## 2012-11-20 NOTE — Assessment & Plan Note (Signed)
Tolerating statin.  She will return for fasting FLP

## 2012-11-20 NOTE — Progress Notes (Signed)
Subjective:    Patient ID: Madeline Schaefer, female    DOB: Sep 17, 1945, 67 y.o.   MRN: [id number]  HPI    Subjective:   Patient here for Medicare annual wellness visit and management of other chronic and acute problems.  Immunizations:  Pt up to date with tetanus and pneumonia vaccines. She has had shingles vaccine. Diet: Reports healthy diet Exercise: Yes regular Colonoscopy: Due- she will call to schedule appointment.   Dexa: up to date Pap Smear: NA-s/p hysterectomy TAH/BSO Mammogram:  Up to date  Recent kidney stone. Following with Dr. Margarita Grizzle  Hypothyroid- TSH normal 3 months ago.   Hyperlipidemia- she is maintained on Statin, no myalgia  CAD- she is s/p PPM  Risk factors:  Heart disease/hyperlipidemia  Roster of Physicians Providing Medical Care to Patient:  Dr. Hillis Range Dr. Shawnie Pons Dr. Natalia Leatherwood  Activities of Daily Living  In your present state of health, do you have any difficulty performing the following activities? Preparing food and eating?: No  Bathing yourself: No  Getting dressed: No  Using the toilet:No  Moving around from place to place: No  In the past year have you fallen or had a near fall?:No    Home Safety: Has smoke detector and wears seat belts. No firearms. No excess sun exposure.  Diet and Exercise  Current exercise habits:  Enjoys the Y and Yoga, walking Dietary issues discussed: healthy diet   Depression Screen  (Note: if answer to either of the following is "Yes", then a more complete depression screening is indicated)  Q1: Over the past two weeks, have you felt down, depressed or hopeless?no  Q2: Over the past two weeks, have you felt little interest or pleasure in doing things? no   The following portions of the patient's history were reviewed and updated as appropriate: allergies, current medications, past family history, past medical history, past social history, past surgical history and problem list.     Objective:   Vision: see nursing Hearing: Able to hear forced whisper at 6 feet Body mass index: see nursing Cognitive Impairment Assessment: cognition, memory and judgment appear normal.   Assessment:   Medicare wellness utd on preventive parameters   Plan:   During the course of the visit the patient was educated and counseled about appropriate screening and preventive services including:        Fall prevention        Nutrition counseling   Vaccines / LABS  See below Patient Instructions (the written plan) was given to the patient.      Review of Systems    see HPI Objective:   Physical Exam  Physical Exam  Constitutional: She is oriented to person, place, and time. She appears well-developed and well-nourished. No distress.  HENT:  Head: Normocephalic and atraumatic.  Right Ear: Tympanic membrane and ear canal normal.  Left Ear: Tympanic membrane and ear canal normal.  Mouth/Throat: Oropharynx is clear and moist.  Eyes: Pupils are equal, round, and reactive to light. No scleral icterus.  Neck: Normal range of motion. No thyromegaly present.  Cardiovascular: Normal rate and regular rhythm.   No murmur heard. Pulmonary/Chest: Effort normal and breath sounds normal. No respiratory distress. He has no wheezes. She has no rales. She exhibits no tenderness.  Abdominal: Soft. Bowel sounds are normal. He exhibits no distension and no mass. There is no tenderness. There is no rebound and no guarding.  Musculoskeletal: She exhibits no edema. Bilateral LE varicose veins noted. Lymphadenopathy:  She has no cervical adenopathy.  Neurological: She is alert and oriented to person, place, and time. She exhibits normal muscle tone. Coordination normal.  Skin: Skin is warm and dry.  Psychiatric: She has a normal mood and affect. Her behavior is normal. Judgment and thought content normal.  Breast/pelvic- deferred          Assessment & Plan:    Assessment & Plan:

## 2012-11-20 NOTE — Assessment & Plan Note (Signed)
Continue healthy diet, exercise.  Pt will call to reschedule her colonoscopy.

## 2012-11-20 NOTE — Assessment & Plan Note (Signed)
Noted on BMET to have mild Hyperglycemia. Obtain A1C

## 2012-11-20 NOTE — Assessment & Plan Note (Signed)
Trial of diprolene.

## 2012-11-20 NOTE — Assessment & Plan Note (Signed)
Continue synthroid, check TSH, clinically stable.

## 2012-11-20 NOTE — Telephone Encounter (Signed)
Message left on pharmacy voicemail to cancel previous rx. And call if any questions.

## 2012-11-20 NOTE — Assessment & Plan Note (Addendum)
Microscopic on UA in setting of nephrolithiasis, following with urology.  Defer further eval to urology.

## 2012-11-20 NOTE — Patient Instructions (Addendum)
Please return fasting or lab work. Follow up in 6 months.

## 2012-11-27 DIAGNOSIS — E039 Hypothyroidism, unspecified: Secondary | ICD-10-CM | POA: Diagnosis not present

## 2012-11-27 DIAGNOSIS — R7309 Other abnormal glucose: Secondary | ICD-10-CM | POA: Diagnosis not present

## 2012-11-27 DIAGNOSIS — E785 Hyperlipidemia, unspecified: Secondary | ICD-10-CM | POA: Diagnosis not present

## 2012-11-27 LAB — HEMOGLOBIN A1C: Hgb A1c MFr Bld: 5.7 % — ABNORMAL HIGH (ref <5.7)

## 2012-11-27 LAB — HEPATIC FUNCTION PANEL
ALT: 18 U/L (ref 0–35)
AST: 17 U/L (ref 0–37)
Albumin: 4.2 g/dL (ref 3.5–5.2)
Bilirubin, Direct: 0.2 mg/dL (ref 0.0–0.3)

## 2012-11-27 LAB — LIPID PANEL
Cholesterol: 128 mg/dL (ref 0–200)
Triglycerides: 94 mg/dL (ref <150)

## 2012-11-27 LAB — BASIC METABOLIC PANEL
BUN: 23 mg/dL (ref 6–23)
Chloride: 103 mEq/L (ref 96–112)
Glucose, Bld: 101 mg/dL — ABNORMAL HIGH (ref 70–99)
Potassium: 4.1 mEq/L (ref 3.5–5.3)

## 2012-12-05 ENCOUNTER — Telehealth: Payer: Self-pay | Admitting: Family

## 2012-12-05 NOTE — Telephone Encounter (Signed)
She would like her lab results.

## 2012-12-06 NOTE — Telephone Encounter (Signed)
A1C (diabetes test) upper limit normal- not in diabetic range. Cholesterol and thyroid function look great. Continue lipitor.

## 2012-12-06 NOTE — Telephone Encounter (Signed)
Notified pt. 

## 2012-12-12 DIAGNOSIS — Z961 Presence of intraocular lens: Secondary | ICD-10-CM | POA: Diagnosis not present

## 2012-12-12 DIAGNOSIS — H524 Presbyopia: Secondary | ICD-10-CM | POA: Diagnosis not present

## 2012-12-12 DIAGNOSIS — H35369 Drusen (degenerative) of macula, unspecified eye: Secondary | ICD-10-CM | POA: Diagnosis not present

## 2012-12-12 DIAGNOSIS — H2510 Age-related nuclear cataract, unspecified eye: Secondary | ICD-10-CM | POA: Diagnosis not present

## 2013-01-08 DIAGNOSIS — N200 Calculus of kidney: Secondary | ICD-10-CM | POA: Diagnosis not present

## 2013-01-13 DIAGNOSIS — H2510 Age-related nuclear cataract, unspecified eye: Secondary | ICD-10-CM | POA: Diagnosis not present

## 2013-01-21 DIAGNOSIS — H2510 Age-related nuclear cataract, unspecified eye: Secondary | ICD-10-CM | POA: Diagnosis not present

## 2013-01-23 ENCOUNTER — Telehealth: Payer: Self-pay | Admitting: *Deleted

## 2013-01-23 MED ORDER — LEVOTHYROXINE SODIUM 88 MCG PO CAPS: 1.0000 | ORAL_CAPSULE | Freq: Every day | ORAL | Status: DC

## 2013-01-23 MED ORDER — ATORVASTATIN CALCIUM 40 MG PO TABS: 40.0000 mg | ORAL_TABLET | Freq: Every day | ORAL | Status: DC

## 2013-01-23 NOTE — Telephone Encounter (Signed)
Received call from pt requesting 90 day supply of lipitor and levothyroxine to [facility] in [place]. [place]  Refill sent, pt aware.

## 2013-01-30 ENCOUNTER — Telehealth: Payer: Self-pay | Admitting: *Deleted

## 2013-01-30 NOTE — Telephone Encounter (Signed)
Received call from pharmacy stating levothyroxine does not come in capsule form and they are requesting approval to dispense tablets. Gave verbal ok.

## 2013-02-19 ENCOUNTER — Other Ambulatory Visit: Payer: Self-pay

## 2013-03-08 DIAGNOSIS — N200 Calculus of kidney: Secondary | ICD-10-CM | POA: Diagnosis not present

## 2013-04-10 DIAGNOSIS — N200 Calculus of kidney: Secondary | ICD-10-CM | POA: Diagnosis not present

## 2013-04-10 DIAGNOSIS — M25569 Pain in unspecified knee: Secondary | ICD-10-CM | POA: Diagnosis not present

## 2013-04-10 DIAGNOSIS — N281 Cyst of kidney, acquired: Secondary | ICD-10-CM | POA: Diagnosis not present

## 2013-04-25 DIAGNOSIS — Z23 Encounter for immunization: Secondary | ICD-10-CM | POA: Diagnosis not present

## 2013-05-05 DIAGNOSIS — C44519 Basal cell carcinoma of skin of other part of trunk: Secondary | ICD-10-CM | POA: Diagnosis not present

## 2013-05-05 DIAGNOSIS — D1801 Hemangioma of skin and subcutaneous tissue: Secondary | ICD-10-CM | POA: Diagnosis not present

## 2013-05-05 DIAGNOSIS — L259 Unspecified contact dermatitis, unspecified cause: Secondary | ICD-10-CM | POA: Diagnosis not present

## 2013-05-05 DIAGNOSIS — C4491 Basal cell carcinoma of skin, unspecified: Secondary | ICD-10-CM | POA: Diagnosis not present

## 2013-05-05 DIAGNOSIS — L908 Other atrophic disorders of skin: Secondary | ICD-10-CM | POA: Diagnosis not present

## 2013-05-05 DIAGNOSIS — L570 Actinic keratosis: Secondary | ICD-10-CM | POA: Diagnosis not present

## 2013-05-05 DIAGNOSIS — L819 Disorder of pigmentation, unspecified: Secondary | ICD-10-CM | POA: Diagnosis not present

## 2013-05-05 DIAGNOSIS — L820 Inflamed seborrheic keratosis: Secondary | ICD-10-CM | POA: Diagnosis not present

## 2013-05-05 DIAGNOSIS — L219 Seborrheic dermatitis, unspecified: Secondary | ICD-10-CM | POA: Diagnosis not present

## 2013-05-16 DIAGNOSIS — H35369 Drusen (degenerative) of macula, unspecified eye: Secondary | ICD-10-CM | POA: Diagnosis not present

## 2013-05-16 DIAGNOSIS — Z961 Presence of intraocular lens: Secondary | ICD-10-CM | POA: Diagnosis not present

## 2013-05-22 ENCOUNTER — Other Ambulatory Visit: Payer: Self-pay

## 2013-06-02 DIAGNOSIS — E039 Hypothyroidism, unspecified: Secondary | ICD-10-CM | POA: Diagnosis not present

## 2013-06-02 DIAGNOSIS — E559 Vitamin D deficiency, unspecified: Secondary | ICD-10-CM | POA: Diagnosis not present

## 2013-06-02 DIAGNOSIS — E785 Hyperlipidemia, unspecified: Secondary | ICD-10-CM | POA: Diagnosis not present

## 2013-06-09 ENCOUNTER — Telehealth: Payer: Self-pay | Admitting: Family

## 2013-06-09 NOTE — Telephone Encounter (Signed)
Spoke with pt and advised her not to double up on dose, just resume regular dosing schedule. Ok per verbal from Provider.

## 2013-06-09 NOTE — Telephone Encounter (Signed)
She was sick to her stomach yesterday and did not take her thyroid medicine  Should she just skip the dose or take it today along with todays dose

## 2013-06-10 DIAGNOSIS — E559 Vitamin D deficiency, unspecified: Secondary | ICD-10-CM | POA: Diagnosis not present

## 2013-06-10 DIAGNOSIS — E039 Hypothyroidism, unspecified: Secondary | ICD-10-CM | POA: Diagnosis not present

## 2013-06-10 DIAGNOSIS — E785 Hyperlipidemia, unspecified: Secondary | ICD-10-CM | POA: Diagnosis not present

## 2013-07-15 ENCOUNTER — Telehealth: Payer: Self-pay | Admitting: Family

## 2013-07-15 MED ORDER — LEVOTHYROXINE SODIUM 88 MCG PO TABS: 88.0000 ug | ORAL_TABLET | Freq: Every day | ORAL | Status: DC

## 2013-07-15 NOTE — Telephone Encounter (Signed)
30 Day supply levothyroxine sent to pharmacy. Pt was due for 6 month follow up in November.  Please call pt to arrange appt.

## 2013-07-15 NOTE — Telephone Encounter (Signed)
Refill levothyroxine (SYNTHROID) 88 MCG tablet

## 2013-07-16 NOTE — Telephone Encounter (Signed)
Informed patient of medication refill and she will call back to schedule appointment. Patient wanted me to mail a release form for her and her husband so we can get [place]

## 2013-07-21 ENCOUNTER — Encounter: Payer: Self-pay | Admitting: Internal Medicine

## 2013-07-21 NOTE — Telephone Encounter (Signed)
Per verbal from front office, records releases were mailed to pt and husband.

## 2013-08-18 ENCOUNTER — Encounter: Payer: Self-pay | Admitting: *Deleted

## 2013-08-19 ENCOUNTER — Ambulatory Visit (INDEPENDENT_AMBULATORY_CARE_PROVIDER_SITE_OTHER): Payer: Self-pay | Admitting: Internal Medicine

## 2013-08-19 ENCOUNTER — Encounter: Payer: Self-pay | Admitting: Internal Medicine

## 2013-08-19 VITALS — BP 150/80 | HR 72 | Ht 70.0 in | Wt 171.8 lb

## 2013-08-19 DIAGNOSIS — Z1211 Encounter for screening for malignant neoplasm of colon: Secondary | ICD-10-CM

## 2013-08-19 MED ORDER — NA SULFATE-K SULFATE-MG SULF 17.5-3.13-1.6 GM/177ML PO SOLN: ORAL | Status: DC

## 2013-08-19 NOTE — Progress Notes (Signed)
08/19/2013 Madeline Schaefer [id number] 23-Mar-1946   HISTORY OF PRESENT ILLNESS:  Patient is a pleasant 68 year old female who presents to our office today to schedule a screening colonoscopy.  She had a colonoscopy 11 years ago by Dr. Collene Mares and she was told that she needed a repeat procedure in 10 years from that time.  She does not have any GI complaints.    Past Medical History  Diagnosis Date  . HLD (hyperlipidemia)   . Hypothyroidism   . Mobitz type 2 second degree heart block     s/p PPM by Dr Olevia Perches 2004  . PACEMAKER, PERMANENT   . DYSPNEA   . BRONCHITIS   . CAD (coronary artery disease) cardiologist- dr Lia Foyer    s/p stent -- s/p PPm   Past Surgical History  Procedure Laterality Date  . Pacemaker insertion  2004, 12/22/11    initial implant by Dr Olevia Perches, Generator change (MDT Adapta L) by Dr Rayann Heman 6/13  . Total abdominal hysterectomy w/ bilateral salpingoophorectomy    . Myomectomy    . Cardiac catheterization    . Cystoscopy with retrograde pyelogram, ureteroscopy and stent placement  08/19/2012    Procedure: CYSTOSCOPY WITH RETROGRADE PYELOGRAM, URETEROSCOPY AND STENT PLACEMENT;  Surgeon: Molli Hazard, MD;  Location: [facility];  Service: Urology;  Laterality: Right;    reports that she has never smoked. She has never used smokeless tobacco. She reports that she drinks alcohol. Her drug history is not on file. family history includes Heart attack in her mother; Prostate cancer in her father. Allergies  Allergen Reactions  . Codeine Other (See Comments)    REACTION: Passed  out, facial swelling and hives  . Sulfonamide Derivatives Hives      Outpatient Encounter Prescriptions as of 08/19/2013  Medication Sig  . aspirin EC 81 MG tablet Take 81 mg by mouth daily.  Marland Kitchen atorvastatin (LIPITOR) 40 MG tablet Take 1 tablet (40 mg total) by mouth daily.  . betamethasone dipropionate (DIPROLENE) 0.05 % cream Apply topically 2 (two) times daily.  .  Cholecalciferol (VITAMIN D3) 1000 UNITS CAPS Take 2 capsules by mouth daily.  Marland Kitchen levothyroxine (SYNTHROID) 88 MCG tablet Take 1 tablet (88 mcg total) by mouth daily before breakfast.  . Multiple Vitamin (MULTIVITAMIN) tablet Take 1 tablet by mouth daily.  . Omega-3 Fatty Acids (FISH OIL) 1000 MG CAPS Take 3 capsules by mouth daily.     REVIEW OF SYSTEMS  : All other systems reviewed and negative except where noted in the History of Present Illness.   PHYSICAL EXAM: BP 150/80  Pulse 72  Ht 5\' 10"  (1.778 m)  Wt 171 lb 12.8 oz (77.928 kg)  BMI 24.65 kg/m2  LMP 10/17/1986 General: Well developed white female in no acute distress Head: Normocephalic and atraumatic Eyes:  Sclerae anicteric, conjunctiva pink. Ears: Normal auditory acuity  Lungs: Clear throughout to auscultation Heart: Regular rate and rhythm Abdomen: Soft, non-distended.  Normal bowel sounds.  Non-tender. Rectal: Deferred.  Will be done at the time of colonoscopy. Musculoskeletal: Symmetrical with no gross deformities  Skin: No lesions on visible extremities Extremities: No edema  Neurological: Alert oriented x 4, grossly non-focal Psychological:  Alert and cooperative. Normal mood and affect  ASSESSMENT AND PLAN: -Screening colonoscopy:  Last colonoscopy was 11 years ago by Dr. Collene Mares.  Will schedule.  The risks, benefits, and alternatives were discussed with the patient and he consents to proceed.

## 2013-08-19 NOTE — Patient Instructions (Signed)

## 2013-08-19 NOTE — Progress Notes (Signed)
Agree with [name].

## 2013-08-21 ENCOUNTER — Encounter: Payer: Self-pay | Admitting: Internal Medicine

## 2013-08-21 ENCOUNTER — Ambulatory Visit (INDEPENDENT_AMBULATORY_CARE_PROVIDER_SITE_OTHER): Payer: Medicare Other | Admitting: Internal Medicine

## 2013-08-21 VITALS — BP 147/74 | HR 67 | Ht 70.0 in | Wt 172.0 lb

## 2013-08-21 DIAGNOSIS — Z950 Presence of cardiac pacemaker: Secondary | ICD-10-CM | POA: Diagnosis not present

## 2013-08-21 DIAGNOSIS — I441 Atrioventricular block, second degree: Secondary | ICD-10-CM | POA: Diagnosis not present

## 2013-08-21 DIAGNOSIS — I10 Essential (primary) hypertension: Secondary | ICD-10-CM | POA: Diagnosis not present

## 2013-08-21 LAB — MDC_IDC_ENUM_SESS_TYPE_INCLINIC
Battery Remaining Longevity: 133 mo
Battery Voltage: 2.79 V
Brady Statistic AS VP Percent: 88 %
Lead Channel Pacing Threshold Amplitude: 0.5 V
Lead Channel Pacing Threshold Amplitude: 0.5 V
Lead Channel Setting Pacing Amplitude: 2.5 V
Lead Channel Setting Pacing Pulse Width: 0.4 ms
MDC IDC MSMT BATTERY IMPEDANCE: 135 Ohm
MDC IDC MSMT LEADCHNL RA IMPEDANCE VALUE: 462 Ohm
MDC IDC MSMT LEADCHNL RA PACING THRESHOLD PULSEWIDTH: 0.4 ms
MDC IDC MSMT LEADCHNL RA SENSING INTR AMPL: 2 mV
MDC IDC MSMT LEADCHNL RV IMPEDANCE VALUE: 682 Ohm
MDC IDC MSMT LEADCHNL RV PACING THRESHOLD PULSEWIDTH: 0.4 ms
MDC IDC MSMT LEADCHNL RV SENSING INTR AMPL: 5.6 mV
MDC IDC SESS DTM: 20150205102755
MDC IDC SET LEADCHNL RA PACING AMPLITUDE: 2 V
MDC IDC SET LEADCHNL RV SENSING SENSITIVITY: 2 mV
MDC IDC STAT BRADY AP VP PERCENT: 5 %
MDC IDC STAT BRADY AP VS PERCENT: 0 %
MDC IDC STAT BRADY AS VS PERCENT: 7 %

## 2013-08-21 NOTE — Patient Instructions (Signed)
Your physician wants you to follow-up in: 12 months with Dr Vallery Ridge will receive a reminder letter in the mail two months in advance. If you don't receive a letter, please call our office to schedule the follow-up appointment.    Remote monitoring is used to monitor your Pacemaker or ICD from home. This monitoring reduces the number of office visits required to check your device to one time per year. It allows [place] to keep an eye on the functioning of your device to ensure it is working properly. You are scheduled for a device check from home on 11/24/13. You may send your transmission at any time that day. If you have a wireless device, the transmission will be sent automatically. After your physician reviews your transmission, you will receive a postcard with your next transmission date.

## 2013-08-21 NOTE — Progress Notes (Signed)
PCP: Nance Pear., NP  Madeline Schaefer is a 68 y.o. female who presents today for routine electrophysiology followup.  Since her last visit, the patient reports doing very well.  Today, she denies symptoms of palpitations, chest pain, shortness of breath,  lower extremity edema, dizziness, presyncope, or syncope.  The patient is otherwise without complaint today.   Past Medical History  Diagnosis Date  . HLD (hyperlipidemia)   . Hypothyroidism   . Mobitz type 2 second degree heart block     s/p PPM by Dr Olevia Perches 2004  . PACEMAKER, PERMANENT   . DYSPNEA   . BRONCHITIS   . CAD (coronary artery disease) cardiologist- dr Lia Foyer    s/p stent -- s/p PPm   Past Surgical History  Procedure Laterality Date  . Pacemaker insertion  2004, 12/22/11    initial implant by Dr Olevia Perches, Generator change (MDT Adapta L) by Dr Rayann Heman 6/13  . Total abdominal hysterectomy w/ bilateral salpingoophorectomy    . Myomectomy    . Cardiac catheterization    . Cystoscopy with retrograde pyelogram, ureteroscopy and stent placement  08/19/2012    Procedure: CYSTOSCOPY WITH RETROGRADE PYELOGRAM, URETEROSCOPY AND STENT PLACEMENT;  Surgeon: Molli Hazard, MD;  Location: [facility];  Service: Urology;  Laterality: Right;    Current Outpatient Prescriptions  Medication Sig Dispense Refill  . aspirin EC 81 MG tablet Take 81 mg by mouth daily.      Marland Kitchen atorvastatin (LIPITOR) 40 MG tablet Take 1 tablet (40 mg total) by mouth daily.  90 tablet  1  . Cholecalciferol (VITAMIN D3) 1000 UNITS CAPS Take 2 capsules by mouth daily.      Marland Kitchen levothyroxine (SYNTHROID) 88 MCG tablet Take 1 tablet (88 mcg total) by mouth daily before breakfast.  30 tablet  0  . Multiple Vitamin (MULTIVITAMIN) tablet Take 1 tablet by mouth daily.      . Na Sulfate-K Sulfate-Mg Sulf (SUPREP BOWEL PREP) SOLN Use as directed  2 Bottle  0  . Omega-3 Fatty Acids (FISH OIL) 1000 MG CAPS Take 3 capsules by mouth daily.       No  current facility-administered medications for this visit.    Physical Exam: Filed Vitals:   08/21/13 0945  BP: 147/74  Pulse: 67  Height: 5\' 10"  (1.778 m)  Weight: 172 lb (78.019 kg)    GEN- The patient is well appearing, alert and oriented x 3 today.   Head- normocephalic, atraumatic Eyes-  Sclera clear, conjunctiva pink Ears- hearing intact Oropharynx- clear Lungs- Clear to ausculation bilaterally, normal work of breathing Chest- pacemaker pocket is well healed Heart- Regular rate and rhythm, no murmurs, rubs or gallops, PMI not laterally displaced GI- soft, NT, ND, + BS Extremities- no clubbing, cyanosis, or edema  Pacemaker interrogation- reviewed in detail today,  See PACEART report  Assessment and Plan:  1. Mobitz II second degree AV block Normal pacemaker function See Pace Art report No changes today She is very interested in [employer] when this technology is available.  2. HTN BP mildly elevated today.  She says it is well controlled at home and declines medicine change today Salt restriction advised  3. CAD Stable No change required today   carelink Return in 1 year

## 2013-09-01 ENCOUNTER — Ambulatory Visit (INDEPENDENT_AMBULATORY_CARE_PROVIDER_SITE_OTHER): Payer: Medicare Other | Admitting: Cardiology

## 2013-09-01 ENCOUNTER — Encounter: Payer: Self-pay | Admitting: Cardiology

## 2013-09-01 VITALS — BP 147/85 | HR 65 | Ht 70.0 in | Wt 174.0 lb

## 2013-09-01 DIAGNOSIS — Z950 Presence of cardiac pacemaker: Secondary | ICD-10-CM

## 2013-09-01 DIAGNOSIS — I519 Heart disease, unspecified: Secondary | ICD-10-CM | POA: Diagnosis not present

## 2013-09-01 NOTE — Patient Instructions (Signed)
Your physician recommends that you continue on your current medications as directed. Please refer to the Current Medication list given to you today.  Your physician has requested that you have an echocardiogram. Echocardiography is a painless test that uses sound waves to create images of your heart. It provides your doctor with information about the size and shape of your heart and how well your heart's chambers and valves are working. This procedure takes approximately one hour. There are no restrictions for this procedure.  Your physician wants you to follow-up in: 6 months with Dr. Percival Spanish. You will receive a reminder letter in the mail two months in advance. If you don't receive a letter, please call our office to schedule the follow-up appointment.

## 2013-09-01 NOTE — Progress Notes (Signed)
HPI The patient presents as a new patient for me. She has seen Dr. Rayann Heman for her pacemaker previously was followed by Dr. Lia Foyer.  She has a distant history of stenting to her LAD. She also had bradycardia and is status post pacemaker. She has done well over the years. She doesn't exercise as much as she used to. She has kind of "slacked" off.  She does get some mild muscle aches and fine stretching a little more difficult and wondered if this could be Lipitor. She might have some mild dyspnea with activity such as walking up an incline but thinks this might be related to weight gain and decreased activity. She does not have the same shortness of breath she. She denies any PND or orthopnea. She's had no palpitations, presyncope or syncope.  Allergies  Allergen Reactions  . Codeine Other (See Comments)    REACTION: Passed  out, facial swelling and hives  . Sulfonamide Derivatives Hives    Current Outpatient Prescriptions  Medication Sig Dispense Refill  . aspirin EC 81 MG tablet Take 81 mg by mouth daily.      Marland Kitchen atorvastatin (LIPITOR) 40 MG tablet Take 1 tablet (40 mg total) by mouth daily.  90 tablet  1  . Cholecalciferol (VITAMIN D3) 1000 UNITS CAPS Take 2 capsules by mouth daily.      . fluocinonide ointment (LIDEX) 0.05 %       . levothyroxine (SYNTHROID) 88 MCG tablet Take 1 tablet (88 mcg total) by mouth daily before breakfast.  30 tablet  0  . Multiple Vitamin (MULTIVITAMIN) tablet Take 1 tablet by mouth daily.      . Na Sulfate-K Sulfate-Mg Sulf (SUPREP BOWEL PREP) SOLN Use as directed  2 Bottle  0  . Omega-3 Fatty Acids (FISH OIL) 1000 MG CAPS Take 3 capsules by mouth daily.       No current facility-administered medications for this visit.    Past Medical History  Diagnosis Date  . HLD (hyperlipidemia)   . Hypothyroidism   . Mobitz type 2 second degree heart block     s/p PPM by Dr Olevia Perches 2004  . PACEMAKER, PERMANENT   . DYSPNEA   . BRONCHITIS   . CAD (coronary  artery disease)     LAD stent 2003 ([facility]).      Past Surgical History  Procedure Laterality Date  . Pacemaker insertion  2004, 12/22/11    initial implant by Dr Olevia Perches, Generator change (MDT Adapta L) by Dr Rayann Heman 6/13  . Total abdominal hysterectomy w/ bilateral salpingoophorectomy    . Myomectomy    . Cardiac catheterization    . Cystoscopy with retrograde pyelogram, ureteroscopy and stent placement  08/19/2012    Procedure: CYSTOSCOPY WITH RETROGRADE PYELOGRAM, URETEROSCOPY AND STENT PLACEMENT;  Surgeon: Molli Hazard, MD;  Location: [facility];  Service: Urology;  Laterality: Right;    ROS:  As stated in the HPI and negative for all other systems.  PHYSICAL EXAM BP 147/85  Pulse 65  Ht 5\' 10"  (1.778 m)  Wt 174 lb (78.926 kg)  BMI 24.97 kg/m2  LMP 10/17/1986 GENERAL:  Well appearing HEENT:  Pupils equal round and reactive, fundi not visualized, oral mucosa unremarkable NECK:  No jugular venous distention, waveform within normal limits, carotid upstroke brisk and symmetric, no bruits, no thyromegaly LYMPHATICS:  No cervical, inguinal adenopathy LUNGS:  Clear to auscultation bilaterally BACK:  No CVA tenderness CHEST:  Well healed pacemaker scar. HEART:  PMI  not displaced or sustained,S1 and S2 within normal limits, no S3, no S4, no clicks, no rubs, no murmurs ABD:  Flat, positive bowel sounds normal in frequency in pitch, no bruits, no rebound, no guarding, no midline pulsatile mass, no hepatomegaly, no splenomegaly EXT:  2 plus pulses throughout, no edema, no cyanosis no clubbing SKIN:  No rashes no nodules NEURO:  Cranial nerves II through XII grossly intact, motor grossly intact throughout PSYCH:  Cognitively intact, oriented to person place and time  EKG:  Sinus rhythm, rate 65, ventricular pacing 100% capture. 09/01/2013  ASSESSMENT AND PLAN  CAD:  She is having no symptoms. I cannot screen her with a plain treadmill test because of her pacemaker.  She will start more routine exercises. If she has any increasing dyspnea with activity she will let me know and I would need to do a [employer].Otherwise we will continue with primary risk reduction.  Of note we did discuss at length and exercise regimen that she should pursue.  STATUS POST PACEMAKER:  She will have routine followup with Dr. Rayann Heman  HTN:  The blood pressure is mildly elevated today. The blood pressure continues to be high. I have instructed the patient to record a blood pressure diary and recording this. This will be presented for my review and pending these results I will make further suggestions about changes in therapy for optimal blood pressure control.  DYSLIPIDEMIA:    She will continue with her Lipitor for now. However, with a regimen of exercise and stretching if her muscle aches worsen she will need to stop this for a month to 6 weeks. If she does stop the statin and he gets better I would want to take another statin. I don't think there is reason he continue the omega-3 but she is advised to eat fish.  REDUCED EF:  She has had a previous reduced ejection fraction and I will followup with an echocardiogram.

## 2013-09-03 DIAGNOSIS — D1801 Hemangioma of skin and subcutaneous tissue: Secondary | ICD-10-CM | POA: Diagnosis not present

## 2013-09-03 DIAGNOSIS — L905 Scar conditions and fibrosis of skin: Secondary | ICD-10-CM | POA: Diagnosis not present

## 2013-09-03 DIAGNOSIS — L723 Sebaceous cyst: Secondary | ICD-10-CM | POA: Diagnosis not present

## 2013-09-03 DIAGNOSIS — L259 Unspecified contact dermatitis, unspecified cause: Secondary | ICD-10-CM | POA: Diagnosis not present

## 2013-09-03 DIAGNOSIS — L570 Actinic keratosis: Secondary | ICD-10-CM | POA: Diagnosis not present

## 2013-09-03 DIAGNOSIS — L821 Other seborrheic keratosis: Secondary | ICD-10-CM | POA: Diagnosis not present

## 2013-09-05 ENCOUNTER — Other Ambulatory Visit (HOSPITAL_COMMUNITY): Payer: Medicare Other

## 2013-09-08 ENCOUNTER — Telehealth: Payer: Self-pay | Admitting: Family

## 2013-09-08 MED ORDER — ATORVASTATIN CALCIUM 40 MG PO TABS: 40.0000 mg | ORAL_TABLET | Freq: Every day | ORAL | Status: DC

## 2013-09-08 NOTE — Telephone Encounter (Signed)
Requesting refill atorvastatin (LIPITOR) 40 MG tablet  [facility] in [place]

## 2013-09-08 NOTE — Telephone Encounter (Signed)
Informed patient of medication refill and she scheduled appointment for 09/26/13

## 2013-09-08 NOTE — Telephone Encounter (Signed)
Refill sent. Pt was due for 6 month follow up in November and is past due.  Please call pt to arrange follow up within the next month.

## 2013-09-09 ENCOUNTER — Other Ambulatory Visit (HOSPITAL_COMMUNITY): Payer: Medicare Other

## 2013-09-10 ENCOUNTER — Ambulatory Visit (HOSPITAL_COMMUNITY): Payer: Medicare Other | Attending: Cardiology | Admitting: Radiology

## 2013-09-10 DIAGNOSIS — I441 Atrioventricular block, second degree: Secondary | ICD-10-CM | POA: Diagnosis not present

## 2013-09-10 DIAGNOSIS — I10 Essential (primary) hypertension: Secondary | ICD-10-CM | POA: Insufficient documentation

## 2013-09-10 DIAGNOSIS — E039 Hypothyroidism, unspecified: Secondary | ICD-10-CM | POA: Diagnosis not present

## 2013-09-10 DIAGNOSIS — I059 Rheumatic mitral valve disease, unspecified: Secondary | ICD-10-CM | POA: Insufficient documentation

## 2013-09-10 DIAGNOSIS — R0602 Shortness of breath: Secondary | ICD-10-CM | POA: Insufficient documentation

## 2013-09-10 DIAGNOSIS — I359 Nonrheumatic aortic valve disorder, unspecified: Secondary | ICD-10-CM | POA: Diagnosis not present

## 2013-09-10 DIAGNOSIS — I079 Rheumatic tricuspid valve disease, unspecified: Secondary | ICD-10-CM | POA: Insufficient documentation

## 2013-09-10 DIAGNOSIS — E785 Hyperlipidemia, unspecified: Secondary | ICD-10-CM | POA: Insufficient documentation

## 2013-09-10 DIAGNOSIS — I2510 Atherosclerotic heart disease of native coronary artery without angina pectoris: Secondary | ICD-10-CM

## 2013-09-10 DIAGNOSIS — I519 Heart disease, unspecified: Secondary | ICD-10-CM

## 2013-09-10 NOTE — Progress Notes (Signed)
Echocardiogram performed.  

## 2013-09-11 ENCOUNTER — Encounter: Payer: Medicare Other | Admitting: Internal Medicine

## 2013-09-19 ENCOUNTER — Ambulatory Visit (INDEPENDENT_AMBULATORY_CARE_PROVIDER_SITE_OTHER): Payer: Medicare Other | Admitting: *Deleted

## 2013-09-19 DIAGNOSIS — Z950 Presence of cardiac pacemaker: Secondary | ICD-10-CM

## 2013-09-19 NOTE — Progress Notes (Signed)
Pt here for My [facility] smart phone enrollment/training only.

## 2013-09-22 ENCOUNTER — Telehealth: Payer: Self-pay | Admitting: Cardiology

## 2013-09-22 NOTE — Telephone Encounter (Signed)
Left message for pt to call back to discuss results of echo.  EF 50 % - moderate tricuspid regurg.  Last EF found in chart EF was 40-45% with moderate tricuspid regurg.

## 2013-09-22 NOTE — Telephone Encounter (Signed)
Pt aware of results 

## 2013-09-22 NOTE — Telephone Encounter (Signed)
New message  Patient would like results of Echocardiogram. Please call and advise.

## 2013-09-26 ENCOUNTER — Encounter: Payer: Self-pay | Admitting: Family

## 2013-09-26 ENCOUNTER — Ambulatory Visit (INDEPENDENT_AMBULATORY_CARE_PROVIDER_SITE_OTHER): Payer: Medicare Other | Admitting: Family

## 2013-09-26 VITALS — BP 136/82 | HR 71 | Temp 98.2°F | Resp 16 | Ht 70.0 in | Wt 168.0 lb

## 2013-09-26 DIAGNOSIS — F4323 Adjustment disorder with mixed anxiety and depressed mood: Secondary | ICD-10-CM

## 2013-09-26 DIAGNOSIS — F419 Anxiety disorder, unspecified: Secondary | ICD-10-CM | POA: Insufficient documentation

## 2013-09-26 MED ORDER — SERTRALINE HCL 25 MG PO TABS: ORAL_TABLET | ORAL | Status: DC

## 2013-09-26 NOTE — Assessment & Plan Note (Signed)
We discussed referral to a therapist to help her cope with her emotions.   She has a therapist she has seen and will contact her to arrange an appointment.  We discussed trial of SSRI.    I instructed pt to start 1/2 tablet of Zoloft 25mg  once daily for 1 week and then increase to a full tablet once daily on week two as tolerated.  We discussed common side effects such as nausea, drowsiness and weight gain.  Also discussed rare but serious side effect of suicide ideation.  She is instructed to discontinue medication go directly to ED if this occurs.  Pt verbalizes understanding.  Plan follow up in 1 month to evaluate progress.

## 2013-09-26 NOTE — Progress Notes (Signed)
Subjective:    Patient ID: Madeline Schaefer, female    DOB: 1945-07-28, 68 y.o.   MRN: [id number]  HPI  [name] is a 68 yr old female who presents today for follow up. She begins visit tearful about husband's recent memory problems. Reports that she first started to notice changes back in December. Reports that recently he forgot which way to turn on a major road in [place]. Reports occasional odd behavior.  She brought him to see a neurologist who reported "mild cognitive impairment."  He is scheduled for some additional cognitive testing at the [place]. She describes feeling overwhelmed about his diagnosis and uncertainty about how his memory loss is going to progress.  "I feel like my life is over." Reports tearfulness "pretty often."  Denies panic attacks.     Review of Systems See HPI  Past Medical History  Diagnosis Date  . HLD (hyperlipidemia)   . Hypothyroidism   . Mobitz type 2 second degree heart block     s/p PPM by Dr Olevia Perches 2004  . PACEMAKER, PERMANENT   . DYSPNEA   . BRONCHITIS   . CAD (coronary artery disease)     LAD stent 2003 ([facility]).      History   Social History  . Marital Status: Married    Spouse Name: N/A    Number of Children: 0  . Years of Education: N/A   Occupational History  . Not on file.   Social History Main Topics  . Smoking status: Never Smoker   . Smokeless tobacco: Never Used  . Alcohol Use: Yes     Comment: 1 drink daily  . Drug Use: Not on file  . Sexual Activity: Yes   Other Topics Concern  . 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    Past Surgical History  Procedure Laterality Date  . Pacemaker insertion  2004, 12/22/11    initial implant by Dr Olevia Perches, Generator change (MDT Adapta L) by Dr Rayann Heman 6/13  . Total abdominal hysterectomy w/ bilateral salpingoophorectomy    . Myomectomy    . Cardiac catheterization    . Cystoscopy with retrograde pyelogram,  ureteroscopy and stent placement  08/19/2012    Procedure: CYSTOSCOPY WITH RETROGRADE PYELOGRAM, URETEROSCOPY AND STENT PLACEMENT;  Surgeon: Molli Hazard, MD;  Location: [facility];  Service: Urology;  Laterality: Right;    Family History  Problem Relation Age of Onset  . Heart attack Mother   . Prostate cancer Father     Allergies  Allergen Reactions  . Codeine Other (See Comments)    REACTION: Passed  out, facial swelling and hives  . Sulfonamide Derivatives Hives    Current Outpatient Prescriptions on File Prior to Visit  Medication Sig Dispense Refill  . aspirin EC 81 MG tablet Take 81 mg by mouth daily.      Marland Kitchen atorvastatin (LIPITOR) 40 MG tablet Take 1 tablet (40 mg total) by mouth daily.  90 tablet  0  . Cholecalciferol (VITAMIN D3) 1000 UNITS CAPS Take 2 capsules by mouth daily.      . fluocinonide ointment (LIDEX) 0.05 %       . levothyroxine (SYNTHROID) 88 MCG tablet Take 1 tablet (88 mcg total) by mouth daily before breakfast.  30 tablet  0  . Multiple Vitamin (MULTIVITAMIN) tablet Take 1 tablet by mouth daily.      . Na Sulfate-K Sulfate-Mg  Sulf (SUPREP BOWEL PREP) SOLN Use as directed  2 Bottle  0  . Omega-3 Fatty Acids (FISH OIL) 1000 MG CAPS Take 3 capsules by mouth daily.       No current facility-administered medications on file prior to visit.    BP 136/82  Pulse 71  Temp(Src) 98.2 F (36.8 C) (Oral)  Resp 16  Ht 5\' 10"  (1.778 m)  Wt 168 lb 0.6 oz (76.222 kg)  BMI 24.11 kg/m2  SpO2 97%  LMP 10/17/1986       Objective:   Physical Exam  Constitutional: She appears well-developed and well-nourished. No distress.  Psychiatric: Her behavior is normal. Judgment and thought content normal.  Tearful but pleasant          Assessment & Plan:  25 minutes spent with pt. >50% of this time was spent counseling pt on depression.

## 2013-09-26 NOTE — Patient Instructions (Addendum)
Schedule appointment with your therapist. Start sertraline 25mg . Start 1/2 tablet once daily for 1 week and then increase to a full tablet once daily on week two as tolerated.  Plan follow up in 1 month to evaluate progress.

## 2013-09-26 NOTE — Progress Notes (Signed)
Pre visit review using our clinic review tool, if applicable. No additional management support is needed unless otherwise documented below in the visit note. 

## 2013-10-13 ENCOUNTER — Telehealth: Payer: Self-pay | Admitting: *Deleted

## 2013-10-13 NOTE — Telephone Encounter (Signed)
Pt recently started sertraline. States she responded well on the 1/2 tablet and tried titrating to the whole tablet but notes that she felt spacey the next morning and the feeling generally subsided by lunch time. Pt is taking medication at night. She feels the 1/2 tablet was helping her anxiety and depression. Pt wanted to know if she could stay on the 1/2 tablet. Advised pt if she was able to tolerate it and it was controlling her anxiety / depression she could continue 1/2 tablet. Advised her it is not uncommon to have this reaction when titrating and if she is able to tolerate symptoms while titrating, the side effects may lessen after a few days.  Pt voices understanding and will call if she feels symptoms are not under control with 1/2 tablet or she if is unable to tolerate titration of dose.

## 2013-10-13 NOTE — Telephone Encounter (Signed)
Perfect I will often let people stay at 1/2 tab if they are doing well and cannot tolerate titrattion. Can always titrate up later and once she has been on it awhile it will be more tolerated

## 2013-10-15 ENCOUNTER — Encounter: Payer: Self-pay | Admitting: Internal Medicine

## 2013-10-15 ENCOUNTER — Encounter: Payer: Medicare Other | Admitting: Internal Medicine

## 2013-10-15 ENCOUNTER — Ambulatory Visit (AMBULATORY_SURGERY_CENTER): Payer: Medicare Other | Admitting: Internal Medicine

## 2013-10-15 VITALS — BP 128/83 | HR 60 | Temp 98.3°F | Resp 22 | Ht 70.0 in | Wt 171.0 lb

## 2013-10-15 DIAGNOSIS — Z1211 Encounter for screening for malignant neoplasm of colon: Secondary | ICD-10-CM

## 2013-10-15 DIAGNOSIS — I441 Atrioventricular block, second degree: Secondary | ICD-10-CM | POA: Diagnosis not present

## 2013-10-15 DIAGNOSIS — I10 Essential (primary) hypertension: Secondary | ICD-10-CM | POA: Diagnosis not present

## 2013-10-15 DIAGNOSIS — Z886 Allergy status to analgesic agent status: Secondary | ICD-10-CM | POA: Diagnosis not present

## 2013-10-15 DIAGNOSIS — I2510 Atherosclerotic heart disease of native coronary artery without angina pectoris: Secondary | ICD-10-CM | POA: Diagnosis not present

## 2013-10-15 MED ORDER — SODIUM CHLORIDE 0.9 % IV SOLN: 500.0000 mL | INTRAVENOUS | Status: DC

## 2013-10-15 NOTE — Op Note (Signed)
[facility]  [address] [place], [id number]   COLONOSCOPY PROCEDURE REPORT  PATIENT: Madeline, Schaefer  MR#: [id number] BIRTHDATE: 05-30-1946 , 68  yrs. old GENDER: Female ENDOSCOPIST: Gatha Mayer, MD, FACG REFERRED PR:XYVOPFY O'Sullivan, FNP PROCEDURE DATE:  10/15/2013 PROCEDURE:   Colonoscopy, screening First Screening Colonoscopy - Avg.  risk and is 50 yrs.  old or older - No.  Prior Negative Screening - Now for repeat screening. 10 or more years since last screening  History of Adenoma - Now for follow-up colonoscopy & has been > or = to 3 yrs.  N/A  Polyps Removed Today? No.  Recommend repeat exam, <10 yrs? No. ASA CLASS:   Class III INDICATIONS:average risk screening and Last colonoscopy performed 2003. MEDICATIONS: propofol (Diprivan) 150mg  IV, MAC sedation, administered by CRNA, and These medications were titrated to patient response per physician's verbal order  DESCRIPTION OF PROCEDURE:   After the risks benefits and alternatives of the procedure were thoroughly explained, informed consent was obtained.  A digital rectal exam revealed no abnormalities of the rectum.   The LB [id number]  endoscope was introduced through the anus and advanced to the cecum, which was identified by both the appendix and ileocecal valve. No adverse events experienced.   The quality of the prep was Suprep good  The instrument was then slowly withdrawn as the colon was fully examined.      COLON FINDINGS: A normal appearing cecum, ileocecal valve, and appendiceal orifice were identified.  The ascending, hepatic flexure, transverse, splenic flexure, descending, sigmoid colon and rectum appeared unremarkable.  No polyps or cancers were seen.   A right colon retroflexion was performed.  Retroflexed views revealed no abnormalities. The time to cecum=4 minutes 18 seconds. Withdrawal time=10 minutes 48 seconds.  The scope was withdrawn and the procedure  completed. COMPLICATIONS: There were no complications.  ENDOSCOPIC IMPRESSION: Normal colonoscopy - good prep - second screening  RECOMMENDATIONS: Consider repeating a colonoscopy in 10 years - 2025 - age 70  eSigned:  Gatha Mayer, MD, [facility] 10/15/2013 2:28 PM   cc: [name] FNP and The Patient

## 2013-10-15 NOTE — Patient Instructions (Addendum)
No polyps!  Colonoscopy was normal.  Consider repeating a colonoscopy in 10 years if going strong at 89 it may make sense.  I appreciate the opportunity to care for you. Gatha Mayer, MD, FACG   YOU HAD AN ENDOSCOPIC PROCEDURE TODAY AT [facility]: Refer to the procedure report that was given to you for any specific questions about what was found during the examination.  If the procedure report does not answer your questions, please call your gastroenterologist to clarify.  If you requested that your care partner not be given the details of your procedure findings, then the procedure report has been included in a sealed envelope for you to review at your convenience later.  YOU SHOULD EXPECT: Some feelings of bloating in the abdomen. Passage of more gas than usual.  Walking can help get rid of the air that was put into your GI tract during the procedure and reduce the bloating. If you had a lower endoscopy (such as a colonoscopy or flexible sigmoidoscopy) you may notice spotting of blood in your stool or on the toilet paper. If you underwent a bowel prep for your procedure, then you may not have a normal bowel movement for a few days.  DIET: Your first meal following the procedure should be a light meal and then it is ok to progress to your normal diet.  A half-sandwich or bowl of soup is an example of a good first meal.  Heavy or fried foods are harder to digest and may make you feel nauseous or bloated.  Likewise meals heavy in dairy and vegetables can cause extra gas to form and this can also increase the bloating.  Drink plenty of fluids but you should avoid alcoholic beverages for 24 hours.  ACTIVITY: Your care partner should take you home directly after the procedure.  You should plan to take it easy, moving slowly for the rest of the day.  You can resume normal activity the day after the procedure however you should NOT DRIVE or use heavy machinery for 24 hours (because of  the sedation medicines used during the test).    SYMPTOMS TO REPORT IMMEDIATELY: A gastroenterologist can be reached at any hour.  During normal business hours, 8:30 AM to 5:00 PM Monday through Friday, call [phone].  After hours and on weekends, please call the GI answering service at [phone] who will take a message and have the physician on call contact you.   Following lower endoscopy (colonoscopy or flexible sigmoidoscopy):  Excessive amounts of blood in the stool  Significant tenderness or worsening of abdominal pains  Swelling of the abdomen that is new, acute  Fever of 100F or higher  Following upper endoscopy (EGD)  Vomiting of blood or coffee ground material  New chest pain or pain under the shoulder blades  Painful or persistently difficult swallowing  New shortness of breath  Fever of 100F or higher  Black, tarry-looking stools  FOLLOW UP: If any biopsies were taken you will be contacted by phone or by letter within the next 1-3 weeks.  Call your gastroenterologist if you have not heard about the biopsies in 3 weeks.  Our staff will call the home number listed on your records the next business day following your procedure to check on you and address any questions or concerns that you may have at that time regarding the information given to you following your procedure. This is a courtesy call and so if there is  no answer at the home number and we have not heard from you through the emergency physician on call, we will assume that you have returned to your regular daily activities without incident.  SIGNATURES/CONFIDENTIALITY: You and/or your care partner have signed paperwork which will be entered into your electronic medical record.  These signatures attest to the fact that that the information above on your After Visit Summary has been reviewed and is understood.  Full responsibility of the confidentiality of this discharge information lies with you and/or your  care-partner.

## 2013-10-15 NOTE — Progress Notes (Signed)
Propofol given over incremental dosages 

## 2013-10-16 ENCOUNTER — Telehealth: Payer: Self-pay

## 2013-10-16 NOTE — Telephone Encounter (Signed)
Left a message at [phone] for the pt to call if any questions or concerns. Maw

## 2013-10-28 ENCOUNTER — Ambulatory Visit: Payer: Medicare Other | Admitting: Family

## 2013-10-30 ENCOUNTER — Encounter: Payer: Medicare Other | Admitting: *Deleted

## 2013-11-04 ENCOUNTER — Ambulatory Visit: Payer: Medicare Other | Admitting: Family

## 2013-11-05 ENCOUNTER — Encounter: Payer: Self-pay | Admitting: Family

## 2013-11-05 ENCOUNTER — Ambulatory Visit (INDEPENDENT_AMBULATORY_CARE_PROVIDER_SITE_OTHER): Payer: Medicare Other | Admitting: Family

## 2013-11-05 VITALS — BP 122/82 | HR 82 | Temp 98.3°F | Resp 16 | Ht 70.0 in | Wt 170.0 lb

## 2013-11-05 DIAGNOSIS — F4323 Adjustment disorder with mixed anxiety and depressed mood: Secondary | ICD-10-CM | POA: Diagnosis not present

## 2013-11-05 DIAGNOSIS — L259 Unspecified contact dermatitis, unspecified cause: Secondary | ICD-10-CM

## 2013-11-05 DIAGNOSIS — N200 Calculus of kidney: Secondary | ICD-10-CM | POA: Diagnosis not present

## 2013-11-05 DIAGNOSIS — L309 Dermatitis, unspecified: Secondary | ICD-10-CM

## 2013-11-05 DIAGNOSIS — Z8679 Personal history of other diseases of the circulatory system: Secondary | ICD-10-CM | POA: Diagnosis not present

## 2013-11-05 MED ORDER — SERTRALINE HCL 25 MG PO TABS
ORAL_TABLET | ORAL | Status: DC
Start: 2013-11-05 — End: 2014-10-19

## 2013-11-05 NOTE — Assessment & Plan Note (Signed)
She would like to try coming off of sertraline.  He situation has improved so she may do OK off of meds.  Will taper as noted below.  Pt is instructed to contact me in 1 month and give me an update on how she is feeling.  If she continues to feel well off of zoloft, plan follow up in 6 months- pt aware.

## 2013-11-05 NOTE — Progress Notes (Signed)
Subjective:    Patient ID: [name], female    DOB: [birth year], 68 y.o.   MRN: [id number]  HPI  Ms. Rauth is a 68 yr old female who presents today for follow up of depression.  Last visit she was started on zoloft.  She reports tolerating the zoloft without adverse side effects. Reports feeling much better.  Notes that she has had some reassuring medical visits in regards to her husband's possible dementia and she is feeling much better about this situation. She wishes to try to taper off of the zoloft.    Eczema- she reports + eczema on her hands.  Using betamethasone which was given to her by dermatology. Review of Systems See HPI  Past Medical History  Diagnosis Date  . HLD (hyperlipidemia)   . Hypothyroidism   . Mobitz type 2 second degree heart block     s/p PPM by Dr Olevia Perches 2004  . PACEMAKER, PERMANENT   . DYSPNEA   . BRONCHITIS   . CAD (coronary artery disease)     LAD stent 2003 ([facility]).    . Blood transfusion without reported diagnosis   . Cataract     History   Social History  . Marital Status: Married    Spouse Name: N/A    Number of Children: 0  . Years of Education: N/A   Occupational History  . Not on file.   Social History Main Topics  . Smoking status: Never Smoker   . Smokeless tobacco: Never Used  . Alcohol Use: Yes     Comment: 1 drink daily  . Drug Use: Not on file  . Sexual Activity: Yes   Other Topics Concern  . 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    Past Surgical History  Procedure Laterality Date  . Pacemaker insertion  2004, 12/22/11    initial implant by Dr Olevia Perches, Generator change (MDT Adapta L) by Dr Rayann Heman 6/13  . Total abdominal hysterectomy w/ bilateral salpingoophorectomy    . Myomectomy    . Cardiac catheterization    . Cystoscopy with retrograde pyelogram, ureteroscopy and stent placement  08/19/2012    Procedure: CYSTOSCOPY WITH RETROGRADE PYELOGRAM,  URETEROSCOPY AND STENT PLACEMENT;  Surgeon: Molli Hazard, MD;  [facility];  Service: Urology;  Laterality: Right;    Family History  Problem Relation Age of Onset  . Heart attack Mother   . Prostate cancer Father     Allergies  Allergen Reactions  . Codeine Other (See Comments)    REACTION: Passed  out, facial swelling and hives  . Sulfonamide Derivatives Hives    Current Outpatient Prescriptions on File Prior to Visit  Medication Sig Dispense Refill  . aspirin EC 81 MG tablet Take 81 mg by mouth daily.      Marland Kitchen atorvastatin (LIPITOR) 40 MG tablet Take 1 tablet (40 mg total) by mouth daily.  90 tablet  0  . Cholecalciferol (VITAMIN D3) 1000 UNITS CAPS Take 2 capsules by mouth daily.      . clobetasol ointment (TEMOVATE) 0.05 % Use as needed for eczema.      . fluocinonide ointment (LIDEX) 0.05 %       . levothyroxine (SYNTHROID) 88 MCG tablet Take 1 tablet (88 mcg total) by mouth daily before breakfast.  30 tablet  0  . Multiple Vitamin (MULTIVITAMIN) tablet Take 1 tablet by mouth daily.      Marland Kitchen  Omega-3 Fatty Acids (FISH OIL) 1000 MG CAPS Take 3 capsules by mouth daily.       No current facility-administered medications on file prior to visit.    BP 122/82  Pulse 82  Temp(Src) 98.3 F (36.8 C) (Oral)  Resp 16  Ht 5\' 10"  (1.778 m)  Wt 170 lb (77.111 kg)  BMI 24.39 kg/m2  SpO2 99%  LMP 10/17/1986       Objective:   Physical Exam  Constitutional: She appears well-developed and well-nourished. No distress.  Skin: Skin is warm and dry.  Some dry eczematous patches noted on hands  Psychiatric: She has a normal mood and affect. Her behavior is normal. Judgment and thought content normal.          Assessment & Plan:

## 2013-11-05 NOTE — Patient Instructions (Signed)
Sertraline 1/2 tab once daily for 7 days, then 1/2 tab every other day for 1 week then stop. Call me in 1 month to let me know how you are feeling.

## 2013-11-05 NOTE — Progress Notes (Signed)
Pre visit review using our clinic review tool, if applicable. No additional management support is needed unless otherwise documented below in the visit note. 

## 2013-11-05 NOTE — Assessment & Plan Note (Signed)
Continue topical steroid cream.  Recommended good emollient such as aquaphor.

## 2013-11-17 ENCOUNTER — Telehealth: Payer: Self-pay | Admitting: Internal Medicine

## 2013-11-17 NOTE — Telephone Encounter (Signed)
Transmission was received on 11/05/13. Next transmission will be 02/04/14.

## 2013-11-17 NOTE — Telephone Encounter (Signed)
New message     Pt sent a transmission about 1wk ago.  Did you get it?

## 2013-12-01 DIAGNOSIS — H43819 Vitreous degeneration, unspecified eye: Secondary | ICD-10-CM | POA: Diagnosis not present

## 2013-12-01 DIAGNOSIS — H5210 Myopia, unspecified eye: Secondary | ICD-10-CM | POA: Diagnosis not present

## 2013-12-01 DIAGNOSIS — Z961 Presence of intraocular lens: Secondary | ICD-10-CM | POA: Diagnosis not present

## 2013-12-01 DIAGNOSIS — H35369 Drusen (degenerative) of macula, unspecified eye: Secondary | ICD-10-CM | POA: Diagnosis not present

## 2013-12-15 ENCOUNTER — Other Ambulatory Visit: Payer: Self-pay | Admitting: Family

## 2013-12-15 DIAGNOSIS — T148 Other injury of unspecified body region: Secondary | ICD-10-CM | POA: Diagnosis not present

## 2013-12-15 DIAGNOSIS — W57XXXA Bitten or stung by nonvenomous insect and other nonvenomous arthropods, initial encounter: Secondary | ICD-10-CM | POA: Diagnosis not present

## 2013-12-15 DIAGNOSIS — L259 Unspecified contact dermatitis, unspecified cause: Secondary | ICD-10-CM | POA: Diagnosis not present

## 2013-12-15 NOTE — Telephone Encounter (Signed)
Received message from pt requesting refill of atorvastatin to [facility] on [address]. Refill sent, left message on home # re: rx completion.

## 2014-02-04 ENCOUNTER — Ambulatory Visit (INDEPENDENT_AMBULATORY_CARE_PROVIDER_SITE_OTHER): Payer: Medicare Other | Admitting: *Deleted

## 2014-02-04 ENCOUNTER — Encounter: Payer: Self-pay | Admitting: Internal Medicine

## 2014-02-04 ENCOUNTER — Telehealth: Payer: Self-pay | Admitting: Cardiology

## 2014-02-04 DIAGNOSIS — I441 Atrioventricular block, second degree: Secondary | ICD-10-CM | POA: Diagnosis not present

## 2014-02-04 LAB — MDC_IDC_ENUM_SESS_TYPE_REMOTE
Battery Impedance: 183 Ohm
Battery Remaining Longevity: 119 mo
Battery Voltage: 2.79 V
Brady Statistic AP VP Percent: 7 %
Brady Statistic AP VS Percent: 0 %
Brady Statistic AS VP Percent: 93 %
Brady Statistic AS VS Percent: 0 %
Date Time Interrogation Session: 20150722171308
Lead Channel Impedance Value: 455 Ohm
Lead Channel Impedance Value: 601 Ohm
Lead Channel Pacing Threshold Amplitude: 0.625 V
Lead Channel Pacing Threshold Amplitude: 0.75 V
Lead Channel Pacing Threshold Pulse Width: 0.4 ms
Lead Channel Pacing Threshold Pulse Width: 0.4 ms
Lead Channel Sensing Intrinsic Amplitude: 2.8 mV
Lead Channel Setting Pacing Amplitude: 2 V
Lead Channel Setting Pacing Amplitude: 2.5 V
Lead Channel Setting Pacing Pulse Width: 0.4 ms
Lead Channel Setting Sensing Sensitivity: 2 mV

## 2014-02-04 NOTE — Telephone Encounter (Signed)
Spoke with pt and reminded pt of remote transmission that is due today. Pt verbalized understanding.   

## 2014-02-04 NOTE — Progress Notes (Signed)
Remote pacemaker transmission.   

## 2014-02-26 ENCOUNTER — Encounter: Payer: Self-pay | Admitting: Cardiology

## 2014-03-02 ENCOUNTER — Encounter: Payer: Self-pay | Admitting: Cardiology

## 2014-05-11 ENCOUNTER — Ambulatory Visit (INDEPENDENT_AMBULATORY_CARE_PROVIDER_SITE_OTHER): Payer: Medicare Other | Admitting: *Deleted

## 2014-05-11 DIAGNOSIS — I441 Atrioventricular block, second degree: Secondary | ICD-10-CM

## 2014-05-11 NOTE — Progress Notes (Signed)
Remote pacemaker transmission.   

## 2014-05-14 LAB — MDC_IDC_ENUM_SESS_TYPE_REMOTE
Battery Impedance: 184 Ohm
Battery Remaining Longevity: 121 mo
Brady Statistic AP VP Percent: 6 %
Brady Statistic AP VS Percent: 0 %
Brady Statistic AS VP Percent: 94 %
Brady Statistic AS VS Percent: 0 %
Date Time Interrogation Session: 20151026125054
Lead Channel Impedance Value: 436 Ohm
Lead Channel Impedance Value: 659 Ohm
Lead Channel Pacing Threshold Amplitude: 0.625 V
Lead Channel Pacing Threshold Pulse Width: 0.4 ms
Lead Channel Setting Pacing Amplitude: 2.5 V
MDC IDC MSMT BATTERY VOLTAGE: 2.79 V
MDC IDC MSMT LEADCHNL RA SENSING INTR AMPL: 1 mV
MDC IDC MSMT LEADCHNL RV PACING THRESHOLD AMPLITUDE: 0.625 V
MDC IDC MSMT LEADCHNL RV PACING THRESHOLD PULSEWIDTH: 0.4 ms
MDC IDC SET LEADCHNL RA PACING AMPLITUDE: 2 V
MDC IDC SET LEADCHNL RV PACING PULSEWIDTH: 0.4 ms
MDC IDC SET LEADCHNL RV SENSING SENSITIVITY: 2 mV

## 2014-05-20 DIAGNOSIS — H43811 Vitreous degeneration, right eye: Secondary | ICD-10-CM | POA: Diagnosis not present

## 2014-05-20 DIAGNOSIS — H26492 Other secondary cataract, left eye: Secondary | ICD-10-CM | POA: Diagnosis not present

## 2014-05-21 ENCOUNTER — Ambulatory Visit: Payer: Medicare Other

## 2014-05-22 DIAGNOSIS — Z23 Encounter for immunization: Secondary | ICD-10-CM | POA: Diagnosis not present

## 2014-06-03 ENCOUNTER — Encounter: Payer: Self-pay | Admitting: Cardiology

## 2014-06-04 DIAGNOSIS — K130 Diseases of lips: Secondary | ICD-10-CM | POA: Diagnosis not present

## 2014-06-08 DIAGNOSIS — Z1231 Encounter for screening mammogram for malignant neoplasm of breast: Secondary | ICD-10-CM | POA: Diagnosis not present

## 2014-06-18 ENCOUNTER — Encounter: Payer: Self-pay | Admitting: Internal Medicine

## 2014-06-19 ENCOUNTER — Encounter: Payer: Self-pay | Admitting: Cardiology

## 2014-06-25 ENCOUNTER — Encounter (HOSPITAL_COMMUNITY): Payer: Self-pay | Admitting: Internal Medicine

## 2014-06-29 ENCOUNTER — Encounter: Payer: Self-pay | Admitting: Family

## 2014-07-06 ENCOUNTER — Telehealth: Payer: Self-pay | Admitting: Family

## 2014-07-06 MED ORDER — ATORVASTATIN CALCIUM 40 MG PO TABS: 40.0000 mg | ORAL_TABLET | Freq: Every day | ORAL | Status: DC

## 2014-07-06 NOTE — Telephone Encounter (Signed)
Caller name: Eron, Goble Relation to pt: self  Call back number: [phone] Pharmacy: [facility] [phone]  Reason for call:  Pt requesting a refill atorvastatin (LIPITOR) 40 MG tablet pt inquiring about thyroid check. Please advise

## 2014-07-06 NOTE — Telephone Encounter (Signed)
Will discuss at tomorrow's apt.

## 2014-07-06 NOTE — Telephone Encounter (Signed)
Refill sent.  I can't see that we notified pt of lab results in May?  Please advise.

## 2014-07-07 ENCOUNTER — Ambulatory Visit: Payer: Medicare Other | Admitting: Family

## 2014-07-07 ENCOUNTER — Other Ambulatory Visit: Payer: Medicare Other

## 2014-07-07 ENCOUNTER — Telehealth: Payer: Self-pay | Admitting: *Deleted

## 2014-07-07 ENCOUNTER — Other Ambulatory Visit (INDEPENDENT_AMBULATORY_CARE_PROVIDER_SITE_OTHER): Payer: Medicare Other

## 2014-07-07 DIAGNOSIS — E785 Hyperlipidemia, unspecified: Secondary | ICD-10-CM | POA: Diagnosis not present

## 2014-07-07 DIAGNOSIS — R739 Hyperglycemia, unspecified: Secondary | ICD-10-CM

## 2014-07-07 DIAGNOSIS — E039 Hypothyroidism, unspecified: Secondary | ICD-10-CM

## 2014-07-07 LAB — LIPID PANEL
CHOL/HDL RATIO: 4
Cholesterol: 147 mg/dL (ref 0–200)
HDL: 41.1 mg/dL (ref >39.00)
LDL Cholesterol: 89 mg/dL (ref 0–99)
NONHDL: 105.9
Triglycerides: 83 mg/dL (ref 0.0–149.0)
VLDL: 16.6 mg/dL (ref 0.0–40.0)

## 2014-07-07 LAB — HEPATIC FUNCTION PANEL
ALK PHOS: 69 U/L (ref 39–117)
ALT: 16 U/L (ref 0–35)
AST: 22 U/L (ref 0–37)
Albumin: 4.1 g/dL (ref 3.5–5.2)
BILIRUBIN DIRECT: 0.1 mg/dL (ref 0.0–0.3)
TOTAL PROTEIN: 7.2 g/dL (ref 6.0–8.3)
Total Bilirubin: 0.6 mg/dL (ref 0.2–1.2)

## 2014-07-07 LAB — BASIC METABOLIC PANEL
BUN: 22 mg/dL (ref 6–23)
CALCIUM: 8.7 mg/dL (ref 8.4–10.5)
CO2: 26 mEq/L (ref 19–32)
CREATININE: 0.8 mg/dL (ref 0.4–1.2)
Chloride: 105 mEq/L (ref 96–112)
GFR: 80.44 mL/min (ref >60.00)
Glucose, Bld: 101 mg/dL — ABNORMAL HIGH (ref 70–99)
Potassium: 4.2 mEq/L (ref 3.5–5.1)
Sodium: 138 mEq/L (ref 135–145)

## 2014-07-07 LAB — HEMOGLOBIN A1C: Hgb A1c MFr Bld: 6.3 % (ref 4.6–6.5)

## 2014-07-07 LAB — TSH: TSH: 1.5 u[IU]/mL (ref 0.35–4.50)

## 2014-07-07 NOTE — Telephone Encounter (Signed)
Perfect

## 2014-07-07 NOTE — Telephone Encounter (Signed)
Pt presented to the office. Provider is out sick. Pt requested orders be placed so she can complete her labs today.  Orders entered for: hgb a1c, lipid panel, hepatic function panel, tsh, and bmet.

## 2014-07-08 ENCOUNTER — Encounter: Payer: Self-pay | Admitting: Family

## 2014-07-22 NOTE — Telephone Encounter (Signed)
Please contact pt re: unread mychart message. 

## 2014-07-22 NOTE — Telephone Encounter (Signed)
Mailed letter to pt

## 2014-07-23 ENCOUNTER — Telehealth: Payer: Self-pay | Admitting: Family

## 2014-07-23 NOTE — Telephone Encounter (Signed)
PT called wondering if medication should stay the same after most recent labs.

## 2014-07-24 NOTE — Telephone Encounter (Signed)
Notified pt per Madeline Schaefer and she voices understanding.

## 2014-07-29 ENCOUNTER — Telehealth: Payer: Self-pay | Admitting: Family

## 2014-07-29 MED ORDER — LEVOTHYROXINE SODIUM 88 MCG PO TABS: 88.0000 ug | ORAL_TABLET | Freq: Every day | ORAL | Status: DC

## 2014-07-29 NOTE — Telephone Encounter (Signed)
Caller name:Elzey, Peri Relation to MB:WGYK Call back [phone] Pharmacy:[facility]  Reason for call: pt is needing rx levothyroxine (SYNTHROID) 88 MCG tablet

## 2014-07-29 NOTE — Telephone Encounter (Signed)
Refill sent to pharmacy, pt notified

## 2014-08-05 ENCOUNTER — Telehealth: Payer: Self-pay | Admitting: Family

## 2014-08-05 DIAGNOSIS — Z13820 Encounter for screening for osteoporosis: Secondary | ICD-10-CM

## 2014-08-05 NOTE — Telephone Encounter (Signed)
EPIC indicates last DEXA 08/12/12 so DEXA should be due 08/12/14.  [name] do you want to go ahead and order DEXA?

## 2014-08-05 NOTE — Telephone Encounter (Signed)
Caller name: Layan, Zalenski Relation to pt: self  Call back number: [phone]   Reason for call:  Pt would like to know when is she due for her Bone Density

## 2014-08-06 ENCOUNTER — Telehealth: Payer: Self-pay | Admitting: Family

## 2014-08-06 NOTE — Telephone Encounter (Signed)
See mychart.  

## 2014-10-19 ENCOUNTER — Encounter: Payer: Self-pay | Admitting: Internal Medicine

## 2014-10-19 ENCOUNTER — Ambulatory Visit (INDEPENDENT_AMBULATORY_CARE_PROVIDER_SITE_OTHER): Payer: Medicare Other | Admitting: Internal Medicine

## 2014-10-19 VITALS — BP 128/72 | HR 73 | Ht 70.0 in | Wt 168.4 lb

## 2014-10-19 DIAGNOSIS — I10 Essential (primary) hypertension: Secondary | ICD-10-CM

## 2014-10-19 DIAGNOSIS — I2510 Atherosclerotic heart disease of native coronary artery without angina pectoris: Secondary | ICD-10-CM

## 2014-10-19 DIAGNOSIS — I441 Atrioventricular block, second degree: Secondary | ICD-10-CM

## 2014-10-19 LAB — MDC_IDC_ENUM_SESS_TYPE_INCLINIC
Battery Impedance: 207 Ohm
Battery Remaining Longevity: 118 mo
Battery Voltage: 2.79 V
Brady Statistic AS VS Percent: 0 %
Date Time Interrogation Session: 20160404132010
Lead Channel Pacing Threshold Amplitude: 0.75 V
Lead Channel Pacing Threshold Pulse Width: 0.4 ms
Lead Channel Pacing Threshold Pulse Width: 0.4 ms
Lead Channel Setting Pacing Amplitude: 2 V
Lead Channel Setting Pacing Amplitude: 2.5 V
Lead Channel Setting Pacing Pulse Width: 0.4 ms
Lead Channel Setting Sensing Sensitivity: 2 mV
MDC IDC MSMT LEADCHNL RA IMPEDANCE VALUE: 431 Ohm
MDC IDC MSMT LEADCHNL RA PACING THRESHOLD AMPLITUDE: 0.5 V
MDC IDC MSMT LEADCHNL RA SENSING INTR AMPL: 1.4 mV
MDC IDC MSMT LEADCHNL RV IMPEDANCE VALUE: 730 Ohm
MDC IDC STAT BRADY AP VP PERCENT: 6 %
MDC IDC STAT BRADY AP VS PERCENT: 0 %
MDC IDC STAT BRADY AS VP PERCENT: 94 %

## 2014-10-19 NOTE — Progress Notes (Signed)
Electrophysiology Office Note   Date:  10/19/2014   ID:  Madeline Schaefer, DOB 02-24-1946, MRN [id number]  PCP:  Nance Pear., NP  Primary Electrophysiologist: Thompson Grayer, MD    Chief Complaint  Patient presents with  . Follow-up    2nd degree Mobitz type II AV block     History of Present Illness: Madeline Schaefer is a 69 y.o. female who presents today for electrophysiology evaluation.   She is doing very well.  She continues to travel.  She is looking forward to a trip to [place] in a few months and will be there for 4-5 months.  Today, she denies symptoms of palpitations, chest pain, shortness of breath, orthopnea, PND, lower extremity edema, claudication, dizziness, presyncope, syncope, bleeding, or neurologic sequela. The patient is tolerating medications without difficulties and is otherwise without complaint today.    Past Medical History  Diagnosis Date  . HLD (hyperlipidemia)   . Hypothyroidism   . Mobitz type 2 second degree heart block     s/p PPM by Dr Olevia Perches 2004  . PACEMAKER, PERMANENT   . DYSPNEA   . BRONCHITIS   . CAD (coronary artery disease)     LAD stent 2003 ([facility]).    . Blood transfusion without reported diagnosis   . Cataract    Past Surgical History  Procedure Laterality Date  . Pacemaker insertion  2004, 12/22/11    initial implant by Dr Olevia Perches, Generator change (MDT Adapta L) by Dr Rayann Heman 6/13  . Total abdominal hysterectomy w/ bilateral salpingoophorectomy    . Myomectomy    . Cardiac catheterization    . Cystoscopy with retrograde pyelogram, ureteroscopy and stent placement  08/19/2012    Procedure: CYSTOSCOPY WITH RETROGRADE PYELOGRAM, URETEROSCOPY AND STENT PLACEMENT;  Surgeon: Molli Hazard, MD;  Location[facility];  Service: Urology;  Laterality: Right;  . Pacemaker generator change N/A 12/22/2011    Procedure: PACEMAKER GENERATOR CHANGE;  Surgeon: Thompson Grayer, MD;  Location: [facility] CATH LAB;  Service:  Cardiovascular;  Laterality: N/A;     Current Outpatient Prescriptions  Medication Sig Dispense Refill  . aspirin EC 81 MG tablet Take 81 mg by mouth daily.    Marland Kitchen atorvastatin (LIPITOR) 40 MG tablet Take 1 tablet (40 mg total) by mouth daily. 90 tablet 1  . Cholecalciferol (VITAMIN D3) 1000 UNITS CAPS Take 2 capsules by mouth daily.    . clobetasol ointment (TEMOVATE) 1.32 % Apply 1 application topically daily as needed (eczema).     . fluocinonide ointment (LIDEX) 4.40 % Apply 1 application topically as directed.     Marland Kitchen levothyroxine (SYNTHROID) 88 MCG tablet Take 1 tablet (88 mcg total) by mouth daily before breakfast. 30 tablet 5  . Multiple Vitamin (MULTIVITAMIN) tablet Take 1 tablet by mouth daily.    . Omega-3 Fatty Acids (FISH OIL) 1000 MG CAPS Take 3 capsules by mouth daily.     No current facility-administered medications for this visit.    Allergies:   Codeine and Sulfonamide derivatives   Social History:  The patient  reports that she has never smoked. She has never used smokeless tobacco. She reports that she drinks alcohol.   Family History:  The patient's family history includes Heart attack in her mother; Prostate cancer in her father.    ROS:  Please see the history of present illness.   All other systems are reviewed and negative.    PHYSICAL EXAM: VS:  BP 128/72 mmHg  Pulse 73  Ht 5\' 10"  (1.778 m)  Wt 168 lb 6.4 oz (76.386 kg)  BMI 24.16 kg/m2  LMP 10/17/1986 , BMI Body mass index is 24.16 kg/(m^2). GEN: Well nourished, well developed, in no acute distress HEENT: normal Neck: no JVD, carotid bruits, or masses Cardiac: RRR; no murmurs, rubs, or gallops,no edema  Respiratory:  clear to auscultation bilaterally, normal work of breathing GI: soft, nontender, nondistended, + BS MS: no deformity or atrophy Skin: warm and dry, device pocket is well healed Neuro:  Strength and sensation are intact Psych: euthymic mood, full affect  Device interrogation is  reviewed today in detail.  See PaceArt for details.   Recent Labs: 07/07/2014: ALT 16; BUN 22; Creatinine 0.8; Potassium 4.2; Sodium 138; TSH 1.50    Lipid Panel     Component Value Date/Time   CHOL 147 07/07/2014 0925   TRIG 83.0 07/07/2014 0925   HDL 41.10 07/07/2014 0925   CHOLHDL 4 07/07/2014 0925   VLDL 16.6 07/07/2014 0925   LDLCALC 89 07/07/2014 0925     Wt Readings from Last 3 Encounters:  10/19/14 168 lb 6.4 oz (76.386 kg)  11/05/13 170 lb (77.111 kg)  10/15/13 171 lb (77.565 kg)      ASSESSMENT AND PLAN:   1. Mobitz II second degree AV block Normal pacemaker function See Pace Art report No changes today  2. HTN Stable No change required today  3. CAD Stable No change required today  carelink Return in 1 year  Current medicines are reviewed at length with the patient today.   The patient does not have concerns regarding her medicines.  The following changes were made today:  none  Signed, Thompson Grayer, MD  10/19/2014 11:01 AM     [facility] [address] [place] [address] [phone] (office) [phone] (fax)

## 2014-10-19 NOTE — Patient Instructions (Signed)
Remote monitoring is used to monitor your pacemaker from home. This monitoring reduces the number of office visits required to check your device to one time per year. It allows [place] to keep an eye on the functioning of your device to ensure it is working properly. You are scheduled for a device check from home on 01-19-2015. You may send your transmission at any time that day. If you have a wireless device, the transmission will be sent automatically. After your physician reviews your transmission, you will receive a postcard with your next transmission date.  Your physician recommends that you schedule a follow-up appointment in: 12 months with Dr.Allred

## 2014-10-21 ENCOUNTER — Telehealth: Payer: Self-pay | Admitting: Family

## 2014-10-21 DIAGNOSIS — E2839 Other primary ovarian failure: Secondary | ICD-10-CM

## 2014-10-21 NOTE — Telephone Encounter (Signed)
Order re-entered. Left detailed message on pt's voicemail and to call if any questions.

## 2014-10-21 NOTE — Telephone Encounter (Signed)
[name]-- do we use postmenopause?

## 2014-10-21 NOTE — Telephone Encounter (Signed)
Caller name: Kennisha Relation to pt: self Call back number: [phone] Pharmacy:  Reason for call:   [facility] told patient that the order that was sent over for bone density says "screening". Order cannot say screening or medicare will not cover. Need to send another order over to [facility]

## 2014-10-21 NOTE — Telephone Encounter (Signed)
Estrogen Deficiency please.

## 2014-10-21 NOTE — Telephone Encounter (Signed)
Please see below.

## 2014-10-28 DIAGNOSIS — M899 Disorder of bone, unspecified: Secondary | ICD-10-CM | POA: Diagnosis not present

## 2014-10-29 ENCOUNTER — Telehealth: Payer: Self-pay | Admitting: Family

## 2014-10-29 NOTE — Telephone Encounter (Signed)
Caller name: Sanja Relation to pt: self Call back number: [phone] [facility]acy: [facility] [place]   Reason for call:   Patient has an appointment on 5/9. Would like to start taking generic zoloft again and is requesting refill. Wants to have this in her system for her 5/9 appointment and will discuss with [facility] then.

## 2014-10-30 MED ORDER — SERTRALINE HCL 50 MG PO TABS: ORAL_TABLET | ORAL | Status: DC

## 2014-10-30 NOTE — Telephone Encounter (Signed)
Rx sent, just need to verify no SI/HI with pt.

## 2014-10-30 NOTE — Telephone Encounter (Signed)
Left detailed message on pt's voicemail and to proceed to the ER if having SI/HI thoughts.

## 2014-11-06 DIAGNOSIS — N200 Calculus of kidney: Secondary | ICD-10-CM | POA: Diagnosis not present

## 2014-11-13 ENCOUNTER — Encounter: Payer: Self-pay | Admitting: Cardiology

## 2014-11-13 ENCOUNTER — Ambulatory Visit (INDEPENDENT_AMBULATORY_CARE_PROVIDER_SITE_OTHER): Payer: Medicare Other | Admitting: Cardiology

## 2014-11-13 VITALS — BP 124/76 | HR 79 | Ht 70.0 in | Wt 169.8 lb

## 2014-11-13 DIAGNOSIS — I2510 Atherosclerotic heart disease of native coronary artery without angina pectoris: Secondary | ICD-10-CM

## 2014-11-13 DIAGNOSIS — I10 Essential (primary) hypertension: Secondary | ICD-10-CM | POA: Diagnosis not present

## 2014-11-13 NOTE — Patient Instructions (Signed)
Dr.Hochrein wants you to follow-up in: 6 months. You will receive a reminder letter in the mail two months in advance. If you don't receive a letter, please call our office to schedule the follow-up appointment.

## 2014-11-13 NOTE — Progress Notes (Signed)
HPI The patient presents for follow up of CAD.  She has seen Dr. Rayann Heman for her pacemaker previously.   She has a distant history of stenting to her LAD. She also had bradycardia and is status post pacemaker.  This is her second appointment with me. I did send her for an echo last time because she had a previously mildly reduced ejection fraction. This indicated the EF was about 50% with some TR. She doesn't have any symptoms. She's not exercising routinely but does occasionally. The patient denies any new symptoms such as chest discomfort, neck or arm discomfort. There has been no new shortness of breath, PND or orthopnea. There have been no reported palpitations, presyncope or syncope.   Allergies  Allergen Reactions  . Codeine Other (See Comments)    REACTION: Passed  out, facial swelling and hives  . Sulfonamide Derivatives Hives    Current Outpatient Prescriptions  Medication Sig Dispense Refill  . aspirin EC 81 MG tablet Take 81 mg by mouth daily.    Marland Kitchen atorvastatin (LIPITOR) 40 MG tablet Take 1 tablet (40 mg total) by mouth daily. 90 tablet 1  . Cholecalciferol (VITAMIN D3) 1000 UNITS CAPS Take 2 capsules by mouth daily.    . clobetasol ointment (TEMOVATE) 2.42 % Apply 1 application topically daily as needed (eczema).     . fluocinonide ointment (LIDEX) 6.83 % Apply 1 application topically as directed.     Marland Kitchen levothyroxine (SYNTHROID) 88 MCG tablet Take 1 tablet (88 mcg total) by mouth daily before breakfast. 30 tablet 5  . Multiple Vitamin (MULTIVITAMIN) tablet Take 1 tablet by mouth daily.    . Omega-3 Fatty Acids (FISH OIL) 1000 MG CAPS Take 3 capsules by mouth daily.    . sertraline (ZOLOFT) 50 MG tablet 1/2 tab by mouth once daily for 1 week, then increase to a full tab once daily on week two (Patient taking differently: Take 50 mg by mouth daily. 1/2 tab by mouth once daily for 1 week, then increase to a full tab once daily on week two) 30 tablet 0   No current  facility-administered medications for this visit.    Past Medical History  Diagnosis Date  . HLD (hyperlipidemia)   . Hypothyroidism   . Mobitz type 2 second degree heart block     s/p PPM by Dr Olevia Perches 2004  . PACEMAKER, PERMANENT   . DYSPNEA   . BRONCHITIS   . CAD (coronary artery disease)     LAD stent 2003 ([facility]).    . Blood transfusion without reported diagnosis   . Cataract     Past Surgical History  Procedure Laterality Date  . Pacemaker insertion  2004, 12/22/11    initial implant by Dr Olevia Perches, Generator change (MDT Adapta L) by Dr Rayann Heman 6/13  . Total abdominal hysterectomy w/ bilateral salpingoophorectomy    . Myomectomy    . Cardiac catheterization    . Cystoscopy with retrograde pyelogram, ureteroscopy and stent placement  08/19/2012    Procedure: CYSTOSCOPY WITH RETROGRADE PYELOGRAM, URETEROSCOPY AND STENT PLACEMENT;  Surgeon: Molli Hazard, MD;  Location: [facility];  Service: Urology;  Laterality: Right;  . Pacemaker generator change N/A 12/22/2011    Procedure: PACEMAKER GENERATOR CHANGE;  Surgeon: Thompson Grayer, MD;  Location: [facility] CATH LAB;  Service: Cardiovascular;  Laterality: N/A;    ROS:  As stated in the HPI and negative for all other systems.  PHYSICAL EXAM BP 124/76 mmHg  Pulse 79  Ht 5\' 10"  (1.778 m)  Wt 169 lb 12.8 oz (77.021 kg)  BMI 24.36 kg/m2  LMP 10/17/1986 GENERAL:  Well appearing NECK:  No jugular venous distention, waveform within normal limits, carotid upstroke brisk and symmetric, no bruits, no thyromegaly LYMPHATICS:  No cervical, inguinal adenopathy LUNGS:  Clear to auscultation bilaterally BACK:  No CVA tenderness CHEST:  Well healed pacemaker scar. HEART:  PMI not displaced or sustained,S1 and S2 within normal limits, no S3, no S4, no clicks, no rubs, no murmurs ABD:  Flat, positive bowel sounds normal in frequency in pitch, no bruits, no rebound, no guarding, no midline pulsatile mass, no hepatomegaly, no  splenomegaly EXT:  2 plus pulses throughout, no edema, no cyanosis no clubbing   EKG:  Sinus rhythm, rate 79, ventricular pacing 100% capture. 11/13/2014  ASSESSMENT AND PLAN  CAD:  She is having no symptoms. No further testing is indicated.    STATUS POST PACEMAKER:  She will have routine followup with Dr. Rayann Heman  HTN:  The blood pressure is at target.  No change in therapy indicated.   DYSLIPIDEMIA:    She will continue with her Lipitor for now.  She has mild muscle aches.  Her LDL was at target.  REDUCED EF:  Her EF is mildly reduced.  She has mild TR on a recent echo.  This is unchanged.  No change in therapy is indicated.

## 2014-11-16 ENCOUNTER — Encounter: Payer: Self-pay | Admitting: Family

## 2014-11-16 DIAGNOSIS — M8580 Other specified disorders of bone density and structure, unspecified site: Secondary | ICD-10-CM | POA: Insufficient documentation

## 2014-11-20 ENCOUNTER — Telehealth: Payer: Self-pay | Admitting: *Deleted

## 2014-11-20 ENCOUNTER — Encounter: Payer: Self-pay | Admitting: *Deleted

## 2014-11-20 NOTE — Telephone Encounter (Signed)
Pre-Visit Call completed with patient and chart updated.   Pre-Visit Info documented in Specialty Comments under [employer].    

## 2014-11-23 ENCOUNTER — Ambulatory Visit (INDEPENDENT_AMBULATORY_CARE_PROVIDER_SITE_OTHER): Payer: Medicare Other | Admitting: Family

## 2014-11-23 ENCOUNTER — Encounter: Payer: Self-pay | Admitting: Family

## 2014-11-23 VITALS — BP 122/70 | HR 68 | Temp 98.2°F | Resp 16 | Ht 69.0 in | Wt 170.0 lb

## 2014-11-23 DIAGNOSIS — Z0000 Encounter for general adult medical examination without abnormal findings: Secondary | ICD-10-CM | POA: Diagnosis not present

## 2014-11-23 DIAGNOSIS — I2510 Atherosclerotic heart disease of native coronary artery without angina pectoris: Secondary | ICD-10-CM | POA: Diagnosis not present

## 2014-11-23 DIAGNOSIS — Z23 Encounter for immunization: Secondary | ICD-10-CM | POA: Diagnosis not present

## 2014-11-23 DIAGNOSIS — M8580 Other specified disorders of bone density and structure, unspecified site: Secondary | ICD-10-CM | POA: Diagnosis not present

## 2014-11-23 DIAGNOSIS — E785 Hyperlipidemia, unspecified: Secondary | ICD-10-CM | POA: Diagnosis not present

## 2014-11-23 DIAGNOSIS — E039 Hypothyroidism, unspecified: Secondary | ICD-10-CM | POA: Diagnosis not present

## 2014-11-23 DIAGNOSIS — R739 Hyperglycemia, unspecified: Secondary | ICD-10-CM | POA: Diagnosis not present

## 2014-11-23 DIAGNOSIS — F4323 Adjustment disorder with mixed anxiety and depressed mood: Secondary | ICD-10-CM

## 2014-11-23 LAB — LIPID PANEL
Cholesterol: 161 mg/dL (ref 0–200)
HDL: 50.1 mg/dL (ref >39.00)
LDL CALC: 91 mg/dL (ref 0–99)
NONHDL: 110.9
Total CHOL/HDL Ratio: 3
Triglycerides: 101 mg/dL (ref 0.0–149.0)
VLDL: 20.2 mg/dL (ref 0.0–40.0)

## 2014-11-23 LAB — BASIC METABOLIC PANEL
BUN: 18 mg/dL (ref 6–23)
CHLORIDE: 101 meq/L (ref 96–112)
CO2: 30 mEq/L (ref 19–32)
Calcium: 9.2 mg/dL (ref 8.4–10.5)
Creatinine, Ser: 0.76 mg/dL (ref 0.40–1.20)
GFR: 80.35 mL/min (ref >60.00)
Glucose, Bld: 104 mg/dL — ABNORMAL HIGH (ref 70–99)
Potassium: 4.1 mEq/L (ref 3.5–5.1)
Sodium: 136 mEq/L (ref 135–145)

## 2014-11-23 LAB — TSH: TSH: 1.37 u[IU]/mL (ref 0.35–4.50)

## 2014-11-23 LAB — HEMOGLOBIN A1C: Hgb A1c MFr Bld: 6.1 % (ref 4.6–6.5)

## 2014-11-23 NOTE — Progress Notes (Signed)
Patient ID: Madeline Schaefer, female   DOB: 1946/04/13, 69 y.o.   MRN: [id number]  Subjective:    Madeline Schaefer is a 69 y.o. female who presents for Medicare Annual/Subsequent preventive examination.  Preventive Screening-Counseling & Management  Tobacco History  Smoking status  . Never Smoker   Smokeless tobacco  . Never Used     Problems Prior to Visit 1. Anxiety/depression- Start zoloft 3/13.  Notes recent increased stress. Reports that her Stepdaughter recently moved to [place]. She is an uncontrolled bipolar and was estranged from her biological father (pt's husband).  Will be taking a 4 month RV trip. Sold mountain house which she is happy about. Husband's health has been deteriorating- he is 40 years older than she is. She finds this challenging.  Started zoloft 3 weeks ago. Does not feel that her mood is any better since restarting zoloft. She reports that she also has sexual side effects.    2.  Osteopenia-per dexa 2014.    3.  Hypothyroid-  Maintained on synthroid.  Lab Results  Component Value Date   TSH 1.50 07/07/2014   4.  Hyperlipidemia-  maintained on lipitor.  Lab Results  Component Value Date   CHOL 147 07/07/2014   HDL 41.10 07/07/2014   LDLCALC 89 07/07/2014   TRIG 83.0 07/07/2014   CHOLHDL 4 07/07/2014    5.  Hyperglycemia-   Lab Results  Component Value Date   HGBA1C 6.3 07/07/2014   6. Preventative-  Tetanus up to date, due for prevnar.  Mammo, colo UTD, s/p hysterectomy    Current Problems (verified) Patient Active Problem List   Diagnosis Date Noted  . Osteopenia 11/16/2014  . Adjustment disorder with mixed anxiety and depressed mood 09/26/2013  . Essential hypertension 08/21/2013  . Special screening for malignant neoplasms, colon 08/19/2013  . Hematuria 11/20/2012  . Hyperglycemia 11/20/2012  . Eczema 11/20/2012  . Routine general medical examination at a health care facility 11/20/2012  . Second degree Mobitz II AV block 01/27/2011  .  DYSPNEA 12/15/2009  . PACEMAKER-[employer] 12/01/2008  . Hypothyroidism 08/03/2008  . Hyperlipidemia 08/03/2008  . Coronary atherosclerosis 08/03/2008    Medications Prior to Visit Current Outpatient Prescriptions on File Prior to Visit  Medication Sig Dispense Refill  . aspirin EC 81 MG tablet Take 81 mg by mouth daily.    Marland Kitchen atorvastatin (LIPITOR) 40 MG tablet Take 1 tablet (40 mg total) by mouth daily. 90 tablet 1  . Cholecalciferol (VITAMIN D3) 1000 UNITS CAPS Take 2 capsules by mouth daily.    . clobetasol ointment (TEMOVATE) 5.99 % Apply 1 application topically daily as needed (eczema).     . fluocinonide ointment (LIDEX) 3.57 % Apply 1 application topically as directed.     Marland Kitchen levothyroxine (SYNTHROID) 88 MCG tablet Take 1 tablet (88 mcg total) by mouth daily before breakfast. 30 tablet 5  . sertraline (ZOLOFT) 50 MG tablet 1/2 tab by mouth once daily for 1 week, then increase to a full tab once daily on week two (Patient taking differently: Take 50 mg by mouth daily. 1/2 tab by mouth once daily for 1 week, then increase to a full tab once daily on week two) 30 tablet 0  . Multiple Vitamin (MULTIVITAMIN) tablet Take 1 tablet by mouth daily.    . Omega-3 Fatty Acids (FISH OIL) 1000 MG CAPS Take 3 capsules by mouth daily.     No current facility-administered medications on file prior to visit.    Current Medications (verified)  Current Outpatient Prescriptions  Medication Sig Dispense Refill  . aspirin EC 81 MG tablet Take 81 mg by mouth daily.    Marland Kitchen atorvastatin (LIPITOR) 40 MG tablet Take 1 tablet (40 mg total) by mouth daily. 90 tablet 1  . Cholecalciferol (VITAMIN D3) 1000 UNITS CAPS Take 2 capsules by mouth daily.    . clobetasol ointment (TEMOVATE) 3.81 % Apply 1 application topically daily as needed (eczema).     . fluocinonide ointment (LIDEX) 8.29 % Apply 1 application topically as directed.     Marland Kitchen levothyroxine (SYNTHROID) 88 MCG tablet Take 1 tablet (88 mcg total) by mouth  daily before breakfast. 30 tablet 5  . sertraline (ZOLOFT) 50 MG tablet 1/2 tab by mouth once daily for 1 week, then increase to a full tab once daily on week two (Patient taking differently: Take 50 mg by mouth daily. 1/2 tab by mouth once daily for 1 week, then increase to a full tab once daily on week two) 30 tablet 0  . Multiple Vitamin (MULTIVITAMIN) tablet Take 1 tablet by mouth daily.    . Omega-3 Fatty Acids (FISH OIL) 1000 MG CAPS Take 3 capsules by mouth daily.     No current facility-administered medications for this visit.     Allergies (verified) Codeine and Sulfonamide derivatives   PAST HISTORY  Family History Family History  Problem Relation Age of Onset  . Heart attack Mother   . Prostate cancer Father     Social History History  Substance Use Topics  . Smoking status: Never Smoker   . Smokeless tobacco: Never Used  . Alcohol Use: Yes     Comment: 1 drink daily     Are there smokers in your home (other than you)? No  Risk Factors Current exercise habits: walks  Dietary issues discussed: healthy diet   Cardiac risk factors: advanced age (older than 14 for men, 63 for women).  Depression Screen (Note: if answer to either of the following is "Yes", a more complete depression screening is indicated)   Over the past two weeks, have you felt down, depressed or hopeless? No  Over the past two weeks, have you felt little interest or pleasure in doing things? No  Have you lost interest or pleasure in daily life? No  Do you often feel hopeless? No  Do you cry easily over simple problems? No  Activities of Daily Living In your present state of health, do you have any difficulty performing the following activities?:  Driving? No Managing money?  No Feeding yourself? No Getting from bed to chair? No. Climbing a flight of stairs? No Preparing food and eating?: No Bathing or showering? No Getting dressed: No Getting to the toilet? No Using the  toilet:No Moving around from place to place: No In the past year have you fallen or had a near fall?:Yes- fell playing "pickle ball"   Are you sexually active?  Yes  Do you have more than one partner?  No  Hearing Difficulties: No Do you often ask people to speak up or repeat themselves? No Do you experience ringing or noises in your ears? No Do you have difficulty understanding soft or whispered voices? No   Do you feel that you have a problem with memory? No  Do you often misplace items? No  Do you feel safe at home?  Yes  Cognitive Testing  Alert? Yes  Normal Appearance?Yes  Oriented to person? Yes  Place? Yes   Time? Yes  Recall  of three objects?  Yes  Can perform simple calculations? Yes  Displays appropriate judgment?Yes  Can read the correct time from a watch face?Yes   Advanced Directives have been discussed with the patient? Yes  List the Names of Other Physician/Practitioners you currently use: 1.  [facility]  Indicate any recent Medical Services you may have received from other than [facility] providers in the past year (date may be approximate).  Immunization History  Administered Date(s) Administered  . Influenza Whole 06/01/2008, 05/06/2012  . Influenza-Unspecified 05/17/2013  . Pneumococcal Polysaccharide-23 05/04/2008  . Td 10/06/2003, 11/14/2005  . Tdap 05/02/2011  . Zoster 07/16/2007    Screening Tests Health Maintenance  Topic Date Due  . PNA vac Low Risk Adult (1 of 2 - PCV13) 07/19/2011  . INFLUENZA VACCINE  02/15/2015  . MAMMOGRAM  06/08/2016  . TETANUS/TDAP  05/01/2021  . COLONOSCOPY  10/16/2023  . DEXA SCAN  Completed  . ZOSTAVAX  Completed    All answers were reviewed with the patient and necessary referrals were made:  O'SULLIVAN,Thoams Siefert S., NP   11/23/2014   History reviewed: allergies, current medications, past family history, past medical history, past social history, past surgical history and problem list  Review  of Systems  Review of Systems  Constitutional: Negative for unexpected weight change.  HENT: Negative for hearing loss and rhinorrhea.   Eyes: Negative for visual disturbance.  Respiratory: Negative for cough.   Cardiovascular: Negative for chest pain and leg swelling.  Gastrointestinal: Negative for nausea, vomiting, and blood in stool. mild loose stools since starting zoloft Genitourinary: Negative for dysuria and frequency.  Musculoskeletal: Negative for myalgias and arthralgias.  Skin: + eczema on hands Neurological: Negative for headaches.  Hematological: Negative for adenopathy.  Psychiatric/Behavioral: Denies depression or anxiety   Objective:     Vision by Snellen chart: right eye:20/20, left eye:20/50 - up to date with eye doctor.  Body mass index is 25.09 kg/(m^2). BP 122/70 mmHg  Pulse 68  Temp(Src) 98.2 F (36.8 C) (Oral)  Resp 16  Ht 5\' 9"  (1.753 m)  Wt 170 lb (77.111 kg)  BMI 25.09 kg/m2  SpO2 99%  LMP 10/17/1986   Physical Exam  Constitutional: She is oriented to person, place, and time. She appears well-developed and well-nourished. No distress.  HENT:  Head: Normocephalic and atraumatic.  Right Ear: Tympanic membrane and ear canal normal.  Left Ear: Tympanic membrane and ear canal normal.  Mouth/Throat: Oropharynx is clear and moist.  Eyes: Pupils are equal, round, and reactive to light. No scleral icterus.  Neck: Normal range of motion. No thyromegaly present.  Cardiovascular: Normal rate and regular rhythm.   No murmur heard. Pulmonary/Chest: Effort normal and breath sounds normal. No respiratory distress. He has no wheezes. She has no rales. She exhibits no tenderness.  Abdominal: Soft. Bowel sounds are normal. He exhibits no distension and no mass. There is no tenderness. There is no rebound and no guarding.  Musculoskeletal: She exhibits no edema.  Lymphadenopathy:    She has no cervical adenopathy.  Neurological: She is alert and oriented to  person, place, and time. She has normal patellar reflexes. She exhibits normal muscle tone. Coordination normal.  Skin: Skin is warm and dry.  Psychiatric: She has a normal mood and affect. Her behavior is normal. Judgment and thought content normal.  Breasts: Examined lying Right: Without masses, retractions, discharge or axillary adenopathy.  Left: Without masses, retractions, discharge or axillary adenopathy.  Pelvic: deferred  Assessment & Plan:      Assessment:          Plan:     During the course of the visit the patient was educated and counseled about appropriate screening and preventive services including:    Pneumococcal vaccine   Diet review for nutrition referral? Yes ____  Not Indicated _x___   Patient Instructions (the written plan) was given to the patient.  Medicare Attestation I have personally reviewed: The patient's medical and social history Their use of alcohol, tobacco or illicit drugs Their current medications and supplements The patient's functional ability including ADLs,fall risks, home safety risks, cognitive, and hearing and visual impairment Diet and physical activities Evidence for depression or mood disorders  The patient's weight, height, BMI, and visual acuity have been recorded in the chart.  I have made referrals, counseling, and provided education to the patient based on review of the above and I have provided the patient with a written personalized care plan for preventive services.     O'SULLIVAN,Dayne Chait S., NP   11/23/2014

## 2014-11-23 NOTE — Progress Notes (Signed)
Pre visit review using our clinic review tool, if applicable. No additional management support is needed unless otherwise documented below in the visit note. 

## 2014-11-23 NOTE — Patient Instructions (Addendum)
Complete lab work prior to leaving.  Decrease zoloft to 1/2 tab by mouth once daily for 1 week,then stop. Call if you develop worsening depression/anxiety symptoms. Follow up in 4 months after your return from your trip.

## 2014-11-24 ENCOUNTER — Encounter: Payer: Self-pay | Admitting: Family

## 2014-11-24 ENCOUNTER — Telehealth: Payer: Self-pay | Admitting: Family

## 2014-11-24 NOTE — Assessment & Plan Note (Signed)
Obtain follow up A1C, we discussed diabetic diet, exercise.

## 2014-11-24 NOTE — Assessment & Plan Note (Signed)
prevnar today.  Discussed healthy diet, exercise.

## 2014-11-24 NOTE — Assessment & Plan Note (Signed)
Tolerating statin, obtain FLP.  

## 2014-11-24 NOTE — Assessment & Plan Note (Signed)
Stable on synthroid, continue same, obtain TSH.

## 2014-11-24 NOTE — Assessment & Plan Note (Signed)
Obtain follow up dexa.

## 2014-11-24 NOTE — Assessment & Plan Note (Signed)
She wishes to come off of zoloft.  This is reasonable I think because I think most of her issues have been situational and things seem to be settling down.  We discussed tapering off zoloft as noted in AVS.

## 2014-11-24 NOTE — Telephone Encounter (Signed)
Caller name: warden Relation to pt: self Call back number: [phone] [facility] [place]  Reason for call:  Requesting 90 day supply of thyroid medication. She was not sure if dosing will be changed due to labs.

## 2014-11-25 MED ORDER — LEVOTHYROXINE SODIUM 88 MCG PO TABS: 88.0000 ug | ORAL_TABLET | Freq: Every day | ORAL | Status: DC

## 2015-01-21 ENCOUNTER — Other Ambulatory Visit: Payer: Self-pay | Admitting: Family

## 2015-01-21 MED ORDER — BENZONATATE 100 MG PO CAPS: 100.0000 mg | ORAL_CAPSULE | Freq: Three times a day (TID) | ORAL | Status: DC | PRN

## 2015-01-21 NOTE — Telephone Encounter (Signed)
Left detailed message on voicemail Rx sent.   

## 2015-01-21 NOTE — Telephone Encounter (Signed)
cheratussin is controlled and I can't call across state lines. OK to send tessalon as below to desired pharmacy. She should go to urgent care if cough does not improve.

## 2015-01-21 NOTE — Telephone Encounter (Signed)
Caller name: Mera Gunkel Relationship to patient: self Can be reached: [phone] Pharmacy: [facility] in [place], pt will go to [place] in [place] and get it transferred  Reason for call: Pt states she is in [place] traveling and has a terrible cough. She had left over medicine from her husband, Cheratussin AC. She is asking if Lenna Sciara will call in the same cough syrup for her. If not maybe a pill cough medicine. Please call pt back and let her know or if there is something you can recommend.

## 2015-01-28 ENCOUNTER — Telehealth: Payer: Self-pay | Admitting: Family

## 2015-01-28 MED ORDER — ATORVASTATIN CALCIUM 40 MG PO TABS: 40.0000 mg | ORAL_TABLET | Freq: Every day | ORAL | Status: DC

## 2015-01-28 NOTE — Telephone Encounter (Signed)
Caller name: Rosalyn Gess Relation to pt: Call back number: [phone] Pharmacy:  Reason for call:   Patient states that she is in [place] and is needing a refill of atorvastatin. Patient is needing this today because she will not be around [place] for the next two weeks.    [facility] [place], [place]. P: [phone].

## 2015-01-28 NOTE — Telephone Encounter (Signed)
Rx sent. Left detailed message for pt.

## 2015-03-16 ENCOUNTER — Telehealth: Payer: Self-pay

## 2015-03-16 DIAGNOSIS — T7840XA Allergy, unspecified, initial encounter: Secondary | ICD-10-CM | POA: Diagnosis not present

## 2015-03-16 DIAGNOSIS — T782XXA Anaphylactic shock, unspecified, initial encounter: Secondary | ICD-10-CM | POA: Diagnosis not present

## 2015-03-16 DIAGNOSIS — T63451A Toxic effect of venom of hornets, accidental (unintentional), initial encounter: Secondary | ICD-10-CM | POA: Diagnosis not present

## 2015-03-16 NOTE — Telephone Encounter (Signed)
Pt called in from [facility] Emergency room as she has been stung by a hornet and had a reaction.  Pt was given Prednisone and Benadryl in ED and MD wanted to give her an EpiPen for use during future reactions. Pt stated that the Dr. There did not feel comfortable giving her the Rx for EpiPen because she has a pacemaker and was worried that it might make her heart go to fast.   Pt educated that the risk outweigh the benefits, and if she needs to use the EpiPen she should use it and any cardiac issues can be addressed after her airway is open.  Pt verbalized understanding and no additional questions at this time.

## 2015-04-13 ENCOUNTER — Encounter: Payer: Self-pay | Admitting: Cardiology

## 2015-04-29 ENCOUNTER — Telehealth: Payer: Self-pay | Admitting: Internal Medicine

## 2015-04-29 NOTE — Telephone Encounter (Signed)
Pt missed remote transmission appt on 01/19/15 b/c she was on a road trip and did not have her home monitor with her. Pt agreed to send transmission on 05/03/2015.

## 2015-04-29 NOTE — Telephone Encounter (Signed)
New Message       Pt calling wanting to know when she needs to send a transmission. Please call back and advise. Pt states it is ok to leave her a message with the information.

## 2015-05-03 ENCOUNTER — Telehealth: Payer: Self-pay | Admitting: Cardiology

## 2015-05-03 ENCOUNTER — Ambulatory Visit (INDEPENDENT_AMBULATORY_CARE_PROVIDER_SITE_OTHER): Payer: Medicare Other | Admitting: *Deleted

## 2015-05-03 DIAGNOSIS — I441 Atrioventricular block, second degree: Secondary | ICD-10-CM

## 2015-05-03 NOTE — Telephone Encounter (Signed)
LMOVM reminding pt to send remote transmission.   

## 2015-05-04 DIAGNOSIS — H35362 Drusen (degenerative) of macula, left eye: Secondary | ICD-10-CM | POA: Diagnosis not present

## 2015-05-04 DIAGNOSIS — R51 Headache: Secondary | ICD-10-CM | POA: Diagnosis not present

## 2015-05-04 DIAGNOSIS — H524 Presbyopia: Secondary | ICD-10-CM | POA: Diagnosis not present

## 2015-05-04 DIAGNOSIS — Z961 Presence of intraocular lens: Secondary | ICD-10-CM | POA: Diagnosis not present

## 2015-05-10 ENCOUNTER — Other Ambulatory Visit: Payer: Self-pay | Admitting: Family

## 2015-05-10 DIAGNOSIS — B351 Tinea unguium: Secondary | ICD-10-CM | POA: Diagnosis not present

## 2015-05-10 DIAGNOSIS — L308 Other specified dermatitis: Secondary | ICD-10-CM | POA: Diagnosis not present

## 2015-05-12 ENCOUNTER — Ambulatory Visit: Payer: Medicare Other | Admitting: Cardiology

## 2015-05-12 DIAGNOSIS — Z23 Encounter for immunization: Secondary | ICD-10-CM | POA: Diagnosis not present

## 2015-05-13 NOTE — Progress Notes (Signed)
Remote pacemaker transmission.   

## 2015-05-14 ENCOUNTER — Encounter: Payer: Self-pay | Admitting: Cardiology

## 2015-05-14 LAB — CUP PACEART REMOTE DEVICE CHECK
Battery Impedance: 257 Ohm
Battery Remaining Longevity: 107 mo
Battery Voltage: 2.79 V
Brady Statistic AP VP Percent: 4 %
Brady Statistic AP VS Percent: 0 %
Brady Statistic AS VS Percent: 0 %
Date Time Interrogation Session: 20161017185345
Implantable Lead Implant Date: 20040907
Implantable Lead Implant Date: 20040907
Implantable Lead Location: 753860
Implantable Lead Model: 5076
Lead Channel Impedance Value: 420 Ohm
Lead Channel Impedance Value: 571 Ohm
Lead Channel Sensing Intrinsic Amplitude: 1 mV
Lead Channel Setting Pacing Amplitude: 2 V
Lead Channel Setting Pacing Amplitude: 2.5 V
Lead Channel Setting Pacing Pulse Width: 0.4 ms
MDC IDC LEAD LOCATION: 753859
MDC IDC MSMT LEADCHNL RA PACING THRESHOLD AMPLITUDE: 0.5 V
MDC IDC MSMT LEADCHNL RA PACING THRESHOLD PULSEWIDTH: 0.4 ms
MDC IDC MSMT LEADCHNL RV PACING THRESHOLD AMPLITUDE: 0.75 V
MDC IDC MSMT LEADCHNL RV PACING THRESHOLD PULSEWIDTH: 0.4 ms
MDC IDC SET LEADCHNL RV SENSING SENSITIVITY: 2 mV
MDC IDC STAT BRADY AS VP PERCENT: 96 %

## 2015-05-18 ENCOUNTER — Encounter: Payer: Self-pay | Admitting: Cardiology

## 2015-05-18 ENCOUNTER — Ambulatory Visit (INDEPENDENT_AMBULATORY_CARE_PROVIDER_SITE_OTHER): Payer: Medicare Other | Admitting: Cardiology

## 2015-05-18 VITALS — BP 142/78 | HR 68 | Ht 70.0 in | Wt 160.0 lb

## 2015-05-18 DIAGNOSIS — I2510 Atherosclerotic heart disease of native coronary artery without angina pectoris: Secondary | ICD-10-CM | POA: Diagnosis not present

## 2015-05-18 DIAGNOSIS — I10 Essential (primary) hypertension: Secondary | ICD-10-CM | POA: Diagnosis not present

## 2015-05-18 NOTE — Progress Notes (Signed)
HPI The patient presents for follow up of CAD.  She has seen Dr. Rayann Heman for her pacemaker previously.   She has a distant history of stenting to her LAD. She also had bradycardia and is status post pacemaker.  Since I last saw her she has done well.  The patient denies any new symptoms such as chest discomfort, neck or arm discomfort. There has been no new shortness of breath, PND or orthopnea. There have been no reported palpitations, presyncope or syncope.  She is walking.     Allergies  Allergen Reactions  . Codeine Other (See Comments)    REACTION: Passed  out, facial swelling and hives  . Sulfonamide Derivatives Hives    Current Outpatient Prescriptions  Medication Sig Dispense Refill  . aspirin EC 81 MG tablet Take 81 mg by mouth daily.    Marland Kitchen atorvastatin (LIPITOR) 40 MG tablet TAKE ONE TABLET BY MOUTH ONCE DAILY 90 tablet 1  . benzonatate (TESSALON) 100 MG capsule Take 1 capsule (100 mg total) by mouth 3 (three) times daily as needed. 20 capsule 0  . Cholecalciferol (VITAMIN D3) 1000 UNITS CAPS Take 2 capsules by mouth daily.    . clobetasol ointment (TEMOVATE) 5.36 % Apply 1 application topically daily as needed (eczema).     . fluocinonide ointment (LIDEX) 6.44 % Apply 1 application topically as directed.     Marland Kitchen levothyroxine (SYNTHROID) 88 MCG tablet Take 1 tablet (88 mcg total) by mouth daily before breakfast. 90 tablet 1  . Multiple Vitamin (MULTIVITAMIN) tablet Take 1 tablet by mouth daily.    . Omega-3 Fatty Acids (FISH OIL) 1000 MG CAPS Take 3 capsules by mouth daily.     No current facility-administered medications for this visit.    Past Medical History  Diagnosis Date  . HLD (hyperlipidemia)   . Hypothyroidism   . Mobitz type 2 second degree heart block     s/p PPM by Dr Olevia Perches 2004  . PACEMAKER, PERMANENT   . DYSPNEA   . BRONCHITIS   . CAD (coronary artery disease)     LAD stent 2003 ([facility]).    . Blood transfusion without reported diagnosis   . Cataract       Past Surgical History  Procedure Laterality Date  . Pacemaker insertion  2004, 12/22/11    initial implant by Dr Olevia Perches, Generator change (MDT Adapta L) by Dr Rayann Heman 6/13  . Total abdominal hysterectomy w/ bilateral salpingoophorectomy    . Myomectomy    . Cardiac catheterization    . Cystoscopy with retrograde pyelogram, ureteroscopy and stent placement  08/19/2012    Procedure: CYSTOSCOPY WITH RETROGRADE PYELOGRAM, URETEROSCOPY AND STENT PLACEMENT;  Surgeon: Molli Hazard, MD;  Location: [facility];  Service: Urology;  Laterality: Right;  . Pacemaker generator change N/A 12/22/2011    Procedure: PACEMAKER GENERATOR CHANGE;  Surgeon: Thompson Grayer, MD;  Location: [facility] CATH LAB;  Service: Cardiovascular;  Laterality: N/A;    ROS:  As stated in the HPI and negative for all other systems.  PHYSICAL EXAM BP 142/78 mmHg  Pulse 68  Ht 5\' 10"  (1.778 m)  Wt 160 lb (72.576 kg)  BMI 22.96 kg/m2  LMP 10/17/1986 GENERAL:  Well appearing NECK:  No jugular venous distention, waveform within normal limits, carotid upstroke brisk and symmetric, no bruits, no thyromegaly LYMPHATICS:  No cervical, inguinal adenopathy LUNGS:  Clear to auscultation bilaterally BACK:  No CVA tenderness CHEST:  Well healed pacemaker scar. HEART:  PMI  not displaced or sustained,S1 and S2 within normal limits, no S3, no S4, no clicks, no rubs, no murmurs ABD:  Flat, positive bowel sounds normal in frequency in pitch, no bruits, no rebound, no guarding, no midline pulsatile mass, no hepatomegaly, no splenomegaly EXT:  2 plus pulses throughout, no edema, no cyanosis no clubbing   ASSESSMENT AND PLAN  CAD:  She is having no symptoms. No further testing is indicated.    STATUS POST PACEMAKER:  She will have routine followup with Dr. Rayann Heman  HTN:  The blood pressure is mildly elevated.  No change in therapy indicated.   DYSLIPIDEMIA:    She will continue with her Lipitor.  Her LDL was slightly  elevated but her HDL was 50.    REDUCED EF:  Her EF is mildly reduced.  She has mild TR.  I will follow this clinically.

## 2015-05-18 NOTE — Patient Instructions (Signed)
Your physician wants you to follow-up in: 1 Year. You will receive a reminder letter in the mail two months in advance. If you don't receive a letter, please call our office to schedule the follow-up appointment.  

## 2015-05-20 ENCOUNTER — Ambulatory Visit: Payer: Medicare Other | Admitting: Cardiology

## 2015-05-27 DIAGNOSIS — L821 Other seborrheic keratosis: Secondary | ICD-10-CM | POA: Diagnosis not present

## 2015-05-27 DIAGNOSIS — L570 Actinic keratosis: Secondary | ICD-10-CM | POA: Diagnosis not present

## 2015-05-27 DIAGNOSIS — B351 Tinea unguium: Secondary | ICD-10-CM | POA: Diagnosis not present

## 2015-05-27 DIAGNOSIS — D1801 Hemangioma of skin and subcutaneous tissue: Secondary | ICD-10-CM | POA: Diagnosis not present

## 2015-05-27 DIAGNOSIS — L814 Other melanin hyperpigmentation: Secondary | ICD-10-CM | POA: Diagnosis not present

## 2015-07-26 ENCOUNTER — Other Ambulatory Visit: Payer: Self-pay | Admitting: Family

## 2015-07-26 NOTE — Telephone Encounter (Signed)
Patient checking on the status of her medication refill for levothyroxine (SYNTHROID) 88 MCG tablet, advised patient pharmacy called on her before

## 2015-07-26 NOTE — Telephone Encounter (Signed)
30 day supply sent to pharmacy. Pt was due for follow up in September and is past due. Notified pt and she states she is out of town and will have to call back Wednesday to arrange an appt. Pt was made aware that we cannot do additional refills until she is seen and she voices understanding.

## 2015-07-30 ENCOUNTER — Telehealth: Payer: Self-pay | Admitting: Family

## 2015-07-30 MED ORDER — EPINEPHRINE 0.3 MG/0.3ML IJ SOAJ: 0.3000 mg | Freq: Once | INTRAMUSCULAR | Status: DC

## 2015-07-30 NOTE — Addendum Note (Signed)
Addended by: [name] on: 07/30/2015 12:16 PM   Modules accepted: Orders

## 2015-07-30 NOTE — Telephone Encounter (Signed)
Pharmacy: [facility] on [address]  Reason for call: Pt states she had a severe allergic reaction to a hornet sting this summer. She was given RX for an epipen but it was $600 and she couldn't get it. They are just back home after being gone for 4 months and pt said RX is expired. She is wanting call back from [place] to see if Lenna Sciara will call in an epi pen for her.  FYI - I scheduled f/u visit for 08/06/15

## 2015-07-30 NOTE — Telephone Encounter (Signed)
rx sent

## 2015-07-30 NOTE — Telephone Encounter (Signed)
[name]-- I could not see that we actually sent the Rx?Marland Kitchen  Please advise?

## 2015-08-03 ENCOUNTER — Ambulatory Visit (INDEPENDENT_AMBULATORY_CARE_PROVIDER_SITE_OTHER): Payer: Medicare Other | Admitting: *Deleted

## 2015-08-03 ENCOUNTER — Telehealth: Payer: Self-pay | Admitting: Cardiology

## 2015-08-03 DIAGNOSIS — I441 Atrioventricular block, second degree: Secondary | ICD-10-CM

## 2015-08-03 NOTE — Telephone Encounter (Signed)
LMOVM reminding pt to send remote transmission.   

## 2015-08-04 NOTE — Progress Notes (Signed)
Remote pacemaker transmission.   

## 2015-08-06 ENCOUNTER — Ambulatory Visit (INDEPENDENT_AMBULATORY_CARE_PROVIDER_SITE_OTHER): Payer: Medicare Other | Admitting: Family

## 2015-08-06 ENCOUNTER — Encounter: Payer: Self-pay | Admitting: Cardiology

## 2015-08-06 ENCOUNTER — Encounter: Payer: Self-pay | Admitting: Family

## 2015-08-06 VITALS — HR 70 | Temp 98.2°F | Resp 16 | Ht 70.0 in | Wt 172.0 lb

## 2015-08-06 DIAGNOSIS — R739 Hyperglycemia, unspecified: Secondary | ICD-10-CM

## 2015-08-06 DIAGNOSIS — E785 Hyperlipidemia, unspecified: Secondary | ICD-10-CM | POA: Diagnosis not present

## 2015-08-06 DIAGNOSIS — Z1159 Encounter for screening for other viral diseases: Secondary | ICD-10-CM | POA: Diagnosis not present

## 2015-08-06 DIAGNOSIS — E039 Hypothyroidism, unspecified: Secondary | ICD-10-CM | POA: Diagnosis not present

## 2015-08-06 LAB — CUP PACEART REMOTE DEVICE CHECK
Brady Statistic AP VP Percent: 4 %
Brady Statistic AP VS Percent: 0 %
Brady Statistic AS VP Percent: 96 %
Brady Statistic AS VS Percent: 0 %
Date Time Interrogation Session: 20170117204559
Implantable Lead Implant Date: 20040907
Implantable Lead Location: 753860
Lead Channel Impedance Value: 489 Ohm
Lead Channel Setting Pacing Amplitude: 2 V
Lead Channel Setting Pacing Amplitude: 2.5 V
Lead Channel Setting Sensing Sensitivity: 2 mV
MDC IDC LEAD IMPLANT DT: 20040907
MDC IDC LEAD LOCATION: 753859
MDC IDC MSMT BATTERY IMPEDANCE: 256 Ohm
MDC IDC MSMT BATTERY REMAINING LONGEVITY: 110 mo
MDC IDC MSMT BATTERY VOLTAGE: 2.79 V
MDC IDC MSMT LEADCHNL RV IMPEDANCE VALUE: 686 Ohm
MDC IDC SET LEADCHNL RV PACING PULSEWIDTH: 0.4 ms

## 2015-08-06 LAB — HEMOGLOBIN A1C
HEMOGLOBIN A1C: 6 % — AB (ref <5.7)
Mean Plasma Glucose: 126 mg/dL — ABNORMAL HIGH (ref <117)

## 2015-08-06 LAB — TSH: TSH: 3.054 u[IU]/mL (ref 0.350–4.500)

## 2015-08-06 LAB — HEPATITIS C ANTIBODY: HCV AB: NEGATIVE

## 2015-08-06 MED ORDER — EPINEPHRINE 0.3 MG/0.3ML IJ SOAJ: 0.3000 mg | Freq: Once | INTRAMUSCULAR | Status: AC

## 2015-08-06 NOTE — Progress Notes (Signed)
Subjective:    Patient ID: Madeline Schaefer, female    DOB: Aug 30, 1945, 70 y.o.   MRN: [id number]  HPI  Hypothyroid- Pt is currently maintained on synthroid 88 mcg. Feels good on this dose.   Lab Results  Component Value Date   TSH 1.37 11/23/2014   Hyperglycemia- reports occasional sweets.   Lab Results  Component Value Date   HGBA1C 6.1 11/23/2014   Hyperlipidemia- Maintained on lipitor. Denies myalgia  Lab Results  Component Value Date   CHOL 161 11/23/2014   HDL 50.10 11/23/2014   LDLCALC 91 11/23/2014   TRIG 101.0 11/23/2014   CHOLHDL 3 11/23/2014    Review of Systems    see HPI  Past Medical History  Diagnosis Date  . HLD (hyperlipidemia)   . Hypothyroidism   . Mobitz type 2 second degree heart block     s/p PPM by Dr Olevia Perches 2004  . PACEMAKER, PERMANENT   . DYSPNEA   . BRONCHITIS   . CAD (coronary artery disease)     LAD stent 2003 ([facility]).    . Blood transfusion without reported diagnosis   . Cataract     Social History   Social History  . Marital Status: Married    Spouse Name: N/A  . Number of Children: 0  . Years of Education: N/A   Occupational History  . Not on file.   Social History Main Topics  . Smoking status: Never Smoker   . Smokeless tobacco: Never Used  . Alcohol Use: Yes     Comment: 1 drink daily  . Drug Use: Not on file  . Sexual Activity: Yes   Other Topics Concern  . 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    Past Surgical History  Procedure Laterality Date  . Pacemaker insertion  2004, 12/22/11    initial implant by Dr Olevia Perches, Generator change (MDT Adapta L) by Dr Rayann Heman 6/13  . Total abdominal hysterectomy w/ bilateral salpingoophorectomy    . Myomectomy    . Cardiac catheterization    . Cystoscopy with retrograde pyelogram, ureteroscopy and stent placement  08/19/2012    Procedure: CYSTOSCOPY WITH RETROGRADE PYELOGRAM, URETEROSCOPY AND STENT  PLACEMENT;  Surgeon: Molli Hazard, MD;  Location: [facility];  Service: Urology;  Laterality: Right;  . Pacemaker generator change N/A 12/22/2011    Procedure: PACEMAKER GENERATOR CHANGE;  Surgeon: Thompson Grayer, MD;  Location: [facility] CATH LAB;  Service: Cardiovascular;  Laterality: N/A;    Family History  Problem Relation Age of Onset  . Heart attack Mother   . Prostate cancer Father     Allergies  Allergen Reactions  . Codeine Other (See Comments)    REACTION: Passed  out, facial swelling and hives  . Hornet Venom Hives    Hives over entire body  . Sulfonamide Derivatives Hives    Current Outpatient Prescriptions on File Prior to Visit  Medication Sig Dispense Refill  . aspirin EC 81 MG tablet Take 81 mg by mouth daily.    Marland Kitchen atorvastatin (LIPITOR) 40 MG tablet TAKE ONE TABLET BY MOUTH ONCE DAILY 90 tablet 1  . Cholecalciferol (VITAMIN D3) 1000 UNITS CAPS Take 2 capsules by mouth daily.    . clobetasol ointment (TEMOVATE) AB-123456789 % Apply 1 application topically daily as needed (eczema).     . fluocinonide ointment (LIDEX) AB-123456789 % Apply 1 application topically as directed.     Marland Kitchen  levothyroxine (SYNTHROID, LEVOTHROID) 88 MCG tablet TAKE ONE TABLET BY MOUTH ONCE DAILY BEFORE  BREAKFAST 30 tablet 0  . Multiple Vitamin (MULTIVITAMIN) tablet Take 1 tablet by mouth daily.    . Omega-3 Fatty Acids (FISH OIL) 1000 MG CAPS Take 3 capsules by mouth daily.     No current facility-administered medications on file prior to visit.    Pulse 70  Temp(Src) 98.2 F (36.8 C) (Oral)  Resp 16  Ht 5\' 10"  (1.778 m)  Wt 172 lb (78.019 kg)  BMI 24.68 kg/m2  SpO2 100%  LMP 10/17/1986    Objective:   Physical Exam  Constitutional: She is oriented to person, place, and time. She appears well-developed and well-nourished.  HENT:  Head: Normocephalic and atraumatic.  Cardiovascular: Normal rate, regular rhythm and normal heart sounds.   No murmur heard. Pulmonary/Chest: Effort  normal and breath sounds normal. No respiratory distress. She has no wheezes.  Neurological: She is alert and oriented to person, place, and time.  Psychiatric: She has a normal mood and affect. Her behavior is normal. Judgment and thought content normal.          Assessment & Plan:

## 2015-08-06 NOTE — Patient Instructions (Signed)
Please complete lab work prior to leaving.   

## 2015-08-06 NOTE — Progress Notes (Signed)
Pre visit review using our clinic review tool, if applicable. No additional management support is needed unless otherwise documented below in the visit note. 

## 2015-08-08 ENCOUNTER — Encounter: Payer: Self-pay | Admitting: Family

## 2015-08-10 NOTE — Assessment & Plan Note (Signed)
Reinforced dietary modification. Lab Results  Component Value Date   HGBA1C 6.0* 08/06/2015   Follow up A1C is stable.

## 2015-08-10 NOTE — Assessment & Plan Note (Addendum)
Stable on synthroid. Continue same.  Lab Results  Component Value Date   TSH 3.054 08/06/2015

## 2015-08-10 NOTE — Assessment & Plan Note (Signed)
Lipids at goal, continue lipitor.  

## 2015-08-16 ENCOUNTER — Telehealth: Payer: Self-pay | Admitting: Family

## 2015-08-16 DIAGNOSIS — E785 Hyperlipidemia, unspecified: Secondary | ICD-10-CM

## 2015-08-16 NOTE — Telephone Encounter (Signed)
I intended to add and didn't, if not possible add on to sample, we can ask her to come back in.  My apologies for the inconvenience.

## 2015-08-16 NOTE — Telephone Encounter (Signed)
Madeline Schaefer-- please advise? 

## 2015-08-16 NOTE — Telephone Encounter (Signed)
Caller name: Rahab   Relationship to patient: Self   Can be reached: [phone]   Reason for call: pt says that she came in to have labs completed on 08/06/15. Pt says that her Cholesterol wasn't checked. To her knowledge it is usually checked every 6 months. Pt says that it wasn't checked on dos  Above. Pt would like to know why? And if she need to schedule to come back in to have completed? .  If so I will be glad to call pt  Back to schedule.

## 2015-08-18 NOTE — Telephone Encounter (Signed)
Notified pt and scheduled lab appt for 08/23/15 at 9:30am. Future order entered.

## 2015-08-23 ENCOUNTER — Other Ambulatory Visit (INDEPENDENT_AMBULATORY_CARE_PROVIDER_SITE_OTHER): Payer: Medicare Other

## 2015-08-23 DIAGNOSIS — E785 Hyperlipidemia, unspecified: Secondary | ICD-10-CM

## 2015-08-23 LAB — LIPID PANEL
CHOL/HDL RATIO: 3
Cholesterol: 137 mg/dL (ref 0–200)
HDL: 47.7 mg/dL (ref >39.00)
LDL Cholesterol: 73 mg/dL (ref 0–99)
NONHDL: 89.42
TRIGLYCERIDES: 80 mg/dL (ref 0.0–149.0)
VLDL: 16 mg/dL (ref 0.0–40.0)

## 2015-08-25 LAB — HM MAMMOGRAPHY

## 2015-09-01 ENCOUNTER — Telehealth: Payer: Self-pay | Admitting: *Deleted

## 2015-09-01 NOTE — Telephone Encounter (Signed)
Pt called requesting results. Normal results given to pt and already released to mychart.

## 2015-09-03 ENCOUNTER — Other Ambulatory Visit: Payer: Self-pay | Admitting: Family

## 2015-09-03 ENCOUNTER — Telehealth: Payer: Self-pay | Admitting: Family

## 2015-09-03 MED ORDER — LEVOTHYROXINE SODIUM 88 MCG PO TABS: ORAL_TABLET | ORAL | Status: DC

## 2015-09-03 MED ORDER — ATORVASTATIN CALCIUM 40 MG PO TABS: 40.0000 mg | ORAL_TABLET | Freq: Every day | ORAL | Status: DC

## 2015-09-03 NOTE — Telephone Encounter (Signed)
Caller name: Yaeli  Relationship to patient: Self  Can be reached: [phone]  Pharmacy: [facility] [place]  Reason for call: pt is requesting a refill on 2 medications.   1. Atorvastatin  2. Levothyroxine

## 2015-09-03 NOTE — Telephone Encounter (Signed)
Refills sent, notified pt. 

## 2015-09-08 ENCOUNTER — Telehealth: Payer: Self-pay | Admitting: Family

## 2015-09-08 NOTE — Telephone Encounter (Signed)
Caller name:Gero, Willa Rough W Relation to [id number]  Call back number:[phone]   Reason for call:  Patient requesting any future scripts please send to [place], [place], [place]) [phone]

## 2015-09-13 NOTE — Telephone Encounter (Signed)
Pt's pharmacy was already updated at last visit.

## 2015-09-21 ENCOUNTER — Encounter: Payer: Self-pay | Admitting: Family

## 2015-11-01 ENCOUNTER — Telehealth: Payer: Self-pay | Admitting: Family

## 2015-11-01 NOTE — Telephone Encounter (Signed)
Can be reached: [phone] Pharmacy: [facility] [place], [place]  Reason for call: pt is wanting to try sertraline 50mg  again. She said that it was discussed with [name] before. She has some that she thinks are expired.

## 2015-11-02 NOTE — Telephone Encounter (Signed)
Left detailed message on voicemail to call and schedule appt if she wants to restart medication.

## 2015-11-02 NOTE — Telephone Encounter (Signed)
I am happy to restart it for her, but since it has been some time since we have discussed depression/anxiety, I would like to see her back in the office first please.

## 2015-11-29 ENCOUNTER — Encounter: Payer: Self-pay | Admitting: Internal Medicine

## 2015-11-29 ENCOUNTER — Ambulatory Visit (INDEPENDENT_AMBULATORY_CARE_PROVIDER_SITE_OTHER): Payer: Medicare Other | Admitting: Internal Medicine

## 2015-11-29 VITALS — BP 148/82 | HR 84 | Ht 70.0 in | Wt 172.4 lb

## 2015-11-29 DIAGNOSIS — I10 Essential (primary) hypertension: Secondary | ICD-10-CM | POA: Diagnosis not present

## 2015-11-29 DIAGNOSIS — I441 Atrioventricular block, second degree: Secondary | ICD-10-CM | POA: Diagnosis not present

## 2015-11-29 LAB — CUP PACEART INCLINIC DEVICE CHECK
Battery Voltage: 2.79 V
Brady Statistic AP VP Percent: 3 %
Brady Statistic AS VP Percent: 97 %
Brady Statistic AS VS Percent: 0 %
Date Time Interrogation Session: 20170515174716
Implantable Lead Location: 753859
Implantable Lead Model: 5076
Implantable Lead Model: 5076
Lead Channel Impedance Value: 511 Ohm
Lead Channel Pacing Threshold Amplitude: 0.5 V
Lead Channel Pacing Threshold Amplitude: 0.75 V
Lead Channel Pacing Threshold Pulse Width: 0.4 ms
Lead Channel Sensing Intrinsic Amplitude: 2 mV
Lead Channel Setting Pacing Amplitude: 2.5 V
MDC IDC LEAD IMPLANT DT: 20040907
MDC IDC LEAD IMPLANT DT: 20040907
MDC IDC LEAD LOCATION: 753860
MDC IDC MSMT BATTERY IMPEDANCE: 280 Ohm
MDC IDC MSMT BATTERY REMAINING LONGEVITY: 109 mo
MDC IDC MSMT LEADCHNL RA PACING THRESHOLD PULSEWIDTH: 0.4 ms
MDC IDC MSMT LEADCHNL RV IMPEDANCE VALUE: 765 Ohm
MDC IDC SET LEADCHNL RA PACING AMPLITUDE: 2 V
MDC IDC SET LEADCHNL RV PACING PULSEWIDTH: 0.4 ms
MDC IDC SET LEADCHNL RV SENSING SENSITIVITY: 2 mV
MDC IDC STAT BRADY AP VS PERCENT: 0 %

## 2015-11-29 NOTE — Patient Instructions (Addendum)
Medication Instructions:  Your physician recommends that you continue on your current medications as directed. Please refer to the Current Medication list given to you today.   Labwork: None ordered   Testing/Procedures: Your physician has requested that you have an echocardiogram. Echocardiography is a painless test that uses sound waves to create images of your heart. It provides your doctor with information about the size and shape of your heart and how well your heart's chambers and valves are working. This procedure takes approximately one hour. There are no restrictions for this procedure.---next year prior to appointment with Chanetta Marshall, NP     Follow-Up: Your physician wants you to follow-up in: 12 months with Chanetta Marshall, NPYou will receive a reminder letter in the mail two months in advance. If you don't receive a letter, please call our office to schedule the follow-up appointment.  Remote monitoring is used to monitor your Pacemaker from home. This monitoring reduces the number of office visits required to check your device to one time per year. It allows [place] to keep an eye on the functioning of your device to ensure it is working properly. You are scheduled for a device check from home on 02/28/16. You may send your transmission at any time that day. If you have a wireless device, the transmission will be sent automatically. After your physician reviews your transmission, you will receive a postcard with your next transmission date.     Any Other Special Instructions Will Be Listed Below (If Applicable).     If you need a refill on your cardiac medications before your next appointment, please call your pharmacy.

## 2015-11-29 NOTE — Progress Notes (Signed)
Electrophysiology Office Note   Date:  11/29/2015   ID:  KERIE HOCKLEY, DOB 1946-07-15, MRN [id number]  PCP:  Nance Pear., NP  Cardiologist: Percival Spanish  Primary Electrophysiologist: Thompson Grayer, MD    CC:  Here for pacemaker follow up   History of Present Illness: Madeline Schaefer is a 70 y.o. female who presents today for electrophysiology evaluation.   She is doing very well.  She continues to travel.  She is looking forward to a trip to [place] in a few months and will be there for 4-5 months.  Today, she denies symptoms of palpitations, chest pain, shortness of breath, orthopnea, PND, lower extremity edema, claudication, dizziness, presyncope, syncope, bleeding, or neurologic sequela. The patient is tolerating medications without difficulties and is otherwise without complaint today.    Past Medical History  Diagnosis Date  . HLD (hyperlipidemia)   . Hypothyroidism   . Mobitz type 2 second degree heart block     s/p PPM by Dr Olevia Perches 2004  . PACEMAKER, PERMANENT   . DYSPNEA   . BRONCHITIS   . CAD (coronary artery disease)     LAD stent 2003 ([facility]).    . Blood transfusion without reported diagnosis   . Cataract    Past Surgical History  Procedure Laterality Date  . Pacemaker insertion  2004, 12/22/11    initial implant by Dr Olevia Perches, Generator change (MDT Adapta L) by Dr Rayann Heman 6/13  . Total abdominal hysterectomy w/ bilateral salpingoophorectomy    . Myomectomy    . Cardiac catheterization    . Cystoscopy with retrograde pyelogram, ureteroscopy and stent placement  08/19/2012    Procedure: CYSTOSCOPY WITH RETROGRADE PYELOGRAM, URETEROSCOPY AND STENT PLACEMENT;  Surgeon: Molli Hazard, MD;  Location: [facility];  Service: Urology;  Laterality: Right;  . Pacemaker generator change N/A 12/22/2011    Procedure: PACEMAKER GENERATOR CHANGE;  Surgeon: Thompson Grayer, MD;  Location: [facility] CATH LAB;  Service: Cardiovascular;  Laterality: N/A;      Current Outpatient Prescriptions  Medication Sig Dispense Refill  . aspirin EC 81 MG tablet Take 81 mg by mouth daily.    Marland Kitchen atorvastatin (LIPITOR) 40 MG tablet Take 1 tablet (40 mg total) by mouth daily. 90 tablet 1  . Cholecalciferol (VITAMIN D3) 1000 UNITS CAPS Take 2 capsules by mouth daily.    . clobetasol ointment (TEMOVATE) AB-123456789 % Apply 1 application topically daily as needed (eczema).     . EPINEPHrine 0.3 mg/0.3 mL IJ SOAJ injection Inject 0.3 mLs (0.3 mg total) into the muscle once. 2 Device 1  . fluocinonide ointment (LIDEX) AB-123456789 % Apply 1 application topically as directed.     Marland Kitchen levothyroxine (SYNTHROID, LEVOTHROID) 88 MCG tablet TAKE ONE TABLET BY MOUTH ONCE DAILY BEFORE  BREAKFAST 90 tablet 1  . Multiple Vitamin (MULTIVITAMIN) tablet Take 1 tablet by mouth daily.    . Omega-3 Fatty Acids (FISH OIL) 1000 MG CAPS Take 3 capsules by mouth daily.     No current facility-administered medications for this visit.    Allergies:   Codeine; Hornet venom; and Sulfonamide derivatives   Social History:  The patient  reports that she has never smoked. She has never used smokeless tobacco. She reports that she drinks alcohol.   Family History:  The patient's family history includes Heart attack in her mother; Prostate cancer in her father.    ROS:  Please see the history of present illness.   All other systems are reviewed and negative.  PHYSICAL EXAM: VS:  LMP 10/17/1986 , BMI There is no weight on file to calculate BMI. GEN: Well nourished, well developed, in no acute distress HEENT: normal Neck: no JVD, carotid bruits, or masses Cardiac: RRR; no murmurs, rubs, or gallops,no edema  Respiratory:  clear to auscultation bilaterally, normal work of breathing GI: soft, nontender, nondistended, + BS MS: no deformity or atrophy Skin: warm and dry, device pocket is well healed Neuro:  Strength and sensation are intact Psych: euthymic mood, full affect  Device interrogation is  reviewed today in detail.  See PaceArt for details.   Recent Labs: 08/06/2015: TSH 3.054    Lipid Panel     Component Value Date/Time   CHOL 137 08/23/2015 0939   TRIG 80.0 08/23/2015 0939   HDL 47.70 08/23/2015 0939   CHOLHDL 3 08/23/2015 0939   VLDL 16.0 08/23/2015 0939   LDLCALC 73 08/23/2015 0939     Wt Readings from Last 3 Encounters:  08/06/15 172 lb (78.019 kg)  05/18/15 160 lb (72.576 kg)  11/23/14 170 lb (77.111 kg)    ekg today reveals sinus rhythm with V pacing   ASSESSMENT AND PLAN:   1. Mobitz II second degree AV block Normal pacemaker function See Pace Art report No changes today  2. HTN Lifestyle modification encouraged today  3. CAD Stable No change required today  4.  Atrial tach Short runs (<5 minutes) and asymptomatic Will monitor for now  carelink Return in 1 year  Current medicines are reviewed at length with the patient today.   The patient does not have concerns regarding her medicines.  The following changes were made today:  none  Signed, Thompson Grayer, MD 11/29/2015 4:23 PM     [place] [address] [phone] (office) [phone] (fax)

## 2015-11-30 ENCOUNTER — Encounter: Payer: Self-pay | Admitting: Family

## 2015-11-30 ENCOUNTER — Other Ambulatory Visit: Payer: Self-pay | Admitting: Family

## 2015-11-30 MED ORDER — ATORVASTATIN CALCIUM 40 MG PO TABS
40.0000 mg | ORAL_TABLET | Freq: Every day | ORAL | Status: DC
Start: 2015-11-30 — End: 2016-03-01

## 2015-11-30 NOTE — Telephone Encounter (Signed)
Please see earlier Rx request from 11/30/15.

## 2015-12-03 NOTE — Telephone Encounter (Signed)
Madeline Schaefer-- pt called back stating she is leaving in early June for travel until October. She scheduled follow up for 04/28/16 at 8:30am. Pt will call with pharmacy for refills to go to on her trip.

## 2016-02-28 ENCOUNTER — Ambulatory Visit (INDEPENDENT_AMBULATORY_CARE_PROVIDER_SITE_OTHER): Payer: Medicare Other | Admitting: *Deleted

## 2016-02-28 ENCOUNTER — Telehealth: Payer: Self-pay | Admitting: Cardiology

## 2016-02-28 DIAGNOSIS — I441 Atrioventricular block, second degree: Secondary | ICD-10-CM | POA: Diagnosis not present

## 2016-02-28 NOTE — Telephone Encounter (Signed)
Spoke with pt and reminded pt of remote transmission that is due today. Pt verbalized understanding.   

## 2016-02-28 NOTE — Progress Notes (Signed)
Remote pacemaker transmission.   

## 2016-03-01 ENCOUNTER — Encounter: Payer: Self-pay | Admitting: Cardiology

## 2016-03-01 ENCOUNTER — Other Ambulatory Visit: Payer: Self-pay | Admitting: Family

## 2016-03-02 ENCOUNTER — Encounter: Payer: Self-pay | Admitting: Family

## 2016-03-02 NOTE — Telephone Encounter (Signed)
Patient checking on the status of medication request [phone]. Advised patient PCP is out of office.

## 2016-03-02 NOTE — Telephone Encounter (Addendum)
error:[id number] ° °

## 2016-03-03 MED ORDER — ATORVASTATIN CALCIUM 40 MG PO TABS: 40.0000 mg | ORAL_TABLET | Freq: Every day | ORAL | 1 refills | Status: DC

## 2016-03-03 MED ORDER — LEVOTHYROXINE SODIUM 88 MCG PO TABS: ORAL_TABLET | ORAL | 1 refills | Status: DC

## 2016-03-07 LAB — CUP PACEART REMOTE DEVICE CHECK
Battery Voltage: 2.79 V
Brady Statistic AP VS Percent: 0 %
Implantable Lead Implant Date: 20040907
Implantable Lead Location: 753859
Implantable Lead Location: 753860
Implantable Lead Model: 5076
Implantable Lead Model: 5076
Lead Channel Pacing Threshold Amplitude: 0.75 V
Lead Channel Pacing Threshold Pulse Width: 0.4 ms
Lead Channel Pacing Threshold Pulse Width: 0.4 ms
Lead Channel Setting Pacing Pulse Width: 0.4 ms
MDC IDC LEAD IMPLANT DT: 20040907
MDC IDC MSMT BATTERY IMPEDANCE: 305 Ohm
MDC IDC MSMT BATTERY REMAINING LONGEVITY: 104 mo
MDC IDC MSMT LEADCHNL RA IMPEDANCE VALUE: 462 Ohm
MDC IDC MSMT LEADCHNL RA PACING THRESHOLD AMPLITUDE: 0.5 V
MDC IDC MSMT LEADCHNL RV IMPEDANCE VALUE: 684 Ohm
MDC IDC SESS DTM: 20170814141839
MDC IDC SET LEADCHNL RA PACING AMPLITUDE: 2 V
MDC IDC SET LEADCHNL RV PACING AMPLITUDE: 2.5 V
MDC IDC SET LEADCHNL RV SENSING SENSITIVITY: 2 mV
MDC IDC STAT BRADY AP VP PERCENT: 3 %
MDC IDC STAT BRADY AS VP PERCENT: 97 %
MDC IDC STAT BRADY AS VS PERCENT: 0 %

## 2016-04-28 ENCOUNTER — Encounter: Payer: Self-pay | Admitting: Family

## 2016-04-28 ENCOUNTER — Ambulatory Visit (INDEPENDENT_AMBULATORY_CARE_PROVIDER_SITE_OTHER): Payer: Medicare Other | Admitting: Family

## 2016-04-28 VITALS — BP 160/90 | HR 80 | Temp 98.1°F | Resp 16 | Ht 70.0 in | Wt 170.8 lb

## 2016-04-28 DIAGNOSIS — E039 Hypothyroidism, unspecified: Secondary | ICD-10-CM | POA: Diagnosis not present

## 2016-04-28 DIAGNOSIS — E785 Hyperlipidemia, unspecified: Secondary | ICD-10-CM

## 2016-04-28 DIAGNOSIS — Z23 Encounter for immunization: Secondary | ICD-10-CM

## 2016-04-28 DIAGNOSIS — I10 Essential (primary) hypertension: Secondary | ICD-10-CM | POA: Diagnosis not present

## 2016-04-28 LAB — LIPID PANEL
Cholesterol: 149 mg/dL (ref 0–200)
HDL: 47 mg/dL (ref >39.00)
LDL Cholesterol: 85 mg/dL (ref 0–99)
NONHDL: 102.19
TRIGLYCERIDES: 85 mg/dL (ref 0.0–149.0)
Total CHOL/HDL Ratio: 3
VLDL: 17 mg/dL (ref 0.0–40.0)

## 2016-04-28 LAB — TSH: TSH: 1.96 u[IU]/mL (ref 0.35–4.50)

## 2016-04-28 MED ORDER — LISINOPRIL 10 MG PO TABS: 10.0000 mg | ORAL_TABLET | Freq: Every day | ORAL | 3 refills | Status: DC

## 2016-04-28 MED ORDER — PNEUMOCOCCAL VAC POLYVALENT 25 MCG/0.5ML IJ INJ: 0.5000 mL | INJECTION | INTRAMUSCULAR | Status: DC

## 2016-04-28 NOTE — Patient Instructions (Signed)
Please complete lab work prior to leaving. Begin lisinopril once daily for blood pressure.

## 2016-04-28 NOTE — Assessment & Plan Note (Signed)
Tolerating statin, obtain follow-up lipid panel. 

## 2016-04-28 NOTE — Progress Notes (Signed)
Pre visit review using our clinic review tool, if applicable. No additional management support is needed unless otherwise documented below in the visit note. 

## 2016-04-28 NOTE — Assessment & Plan Note (Signed)
Clinically stable on sythroid. Continue synthroid and obtain follow up tsh.

## 2016-04-28 NOTE — Assessment & Plan Note (Signed)
Uncontrolled. Begin lisinopril once daily. Plan follow up in 2 weeks for bp recheck and follow up bmet.  Discussed low sodium diet.

## 2016-04-28 NOTE — Progress Notes (Addendum)
Subjective:    Patient ID: Madeline Schaefer, female    DOB: 08-07-1945, 70 y.o.   MRN: [id number]  HPI  [name] is a 70 yr old female who presents today for follow up.  1) HTN- maintained on diet alone.   BP Readings from Last 3 Encounters:  04/28/16 (!) 160/90  11/29/15 (!) 148/82  05/18/15 (!) 142/78   2) Hyperlipidemia- on lipitor and fish oil. Reports good compliance with meds.  Lab Results  Component Value Date   CHOL 137 08/23/2015   HDL 47.70 08/23/2015   LDLCALC 73 08/23/2015   TRIG 80.0 08/23/2015   CHOLHDL 3 08/23/2015   3)  Hypothyroid- maintained on synthroid. Feels good on current meds.  Lab Results  Component Value Date   TSH 3.054 08/06/2015    Review of Systems See HPI  Past Medical History:  Diagnosis Date  . Blood transfusion without reported diagnosis   . BRONCHITIS   . CAD (coronary artery disease)    LAD stent 2003 ([facility]).    . Cataract   . DYSPNEA   . HLD (hyperlipidemia)   . Hypothyroidism   . Mobitz type 2 second degree heart block    s/p PPM by Dr Olevia Perches 2004  . PACEMAKER, PERMANENT      Social History   Social History  . Marital status: Married    Spouse name: N/A  . Number of children: 0  . Years of education: N/A   Occupational History  . Not on file.   Social History Main Topics  . Smoking status: Never Smoker  . Smokeless tobacco: Never Used  . Alcohol use Yes     Comment: 1 drink daily  . Drug use: Unknown  . Sexual activity: Yes   Other Topics Concern  . 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    Past Surgical History:  Procedure Laterality Date  . CARDIAC CATHETERIZATION    . CYSTOSCOPY WITH RETROGRADE PYELOGRAM, URETEROSCOPY AND STENT PLACEMENT  08/19/2012   Procedure: CYSTOSCOPY WITH RETROGRADE PYELOGRAM, URETEROSCOPY AND STENT PLACEMENT;  Surgeon: Molli Hazard, MD;  Location: [facility];  Service: Urology;   Laterality: Right;  . MYOMECTOMY    . PACEMAKER GENERATOR CHANGE N/A 12/22/2011   Procedure: PACEMAKER GENERATOR CHANGE;  Surgeon: Thompson Grayer, MD;  Location: [facility] CATH LAB;  Service: Cardiovascular;  Laterality: N/A;  . PACEMAKER INSERTION  2004, 12/22/11   initial implant by Dr Olevia Perches, Generator change (MDT Adapta L) by Dr Rayann Heman 6/13  . TOTAL ABDOMINAL HYSTERECTOMY W/ BILATERAL SALPINGOOPHORECTOMY      Family History  Problem Relation Age of Onset  . Heart attack Mother   . Prostate cancer Father     Allergies  Allergen Reactions  . Codeine Other (See Comments)    REACTION: Passed  out, facial swelling and hives  . Hornet Venom Hives    Hives over entire body  . Sulfonamide Derivatives Hives    Current Outpatient Prescriptions on File Prior to Visit  Medication Sig Dispense Refill  . aspirin EC 81 MG tablet Take 81 mg by mouth daily.    Marland Kitchen atorvastatin (LIPITOR) 40 MG tablet Take 1 tablet (40 mg total) by mouth daily. 90 tablet 1  . Cholecalciferol (VITAMIN D3) 1000 UNITS CAPS Take 2 capsules by mouth daily.    . clobetasol ointment (TEMOVATE) AB-123456789 % Apply 1 application topically daily as needed (eczema).     Marland Kitchen  EPINEPHrine 0.3 mg/0.3 mL IJ SOAJ injection Inject 0.3 mLs (0.3 mg total) into the muscle once. 2 Device 1  . fluocinonide ointment (LIDEX) AB-123456789 % Apply 1 application topically as directed.     Marland Kitchen levothyroxine (SYNTHROID, LEVOTHROID) 88 MCG tablet TAKE ONE TABLET BY MOUTH ONCE DAILY BEFORE  BREAKFAST 90 tablet 1  . Omega-3 Fatty Acids (FISH OIL) 1000 MG CAPS Take 3 capsules by mouth daily.     No current facility-administered medications on file prior to visit.     BP (!) 160/90 (BP Location: Right Arm, Cuff Size: Normal)   Pulse 80   Temp 98.1 F (36.7 C) (Oral)   Resp 16   Ht 5\' 10"  (1.778 m)   Wt 170 lb 12.8 oz (77.5 kg)   LMP 10/17/1986   SpO2 100% Comment: room air  BMI 24.51 kg/m       Objective:   Physical Exam  Constitutional: She is oriented to  person, place, and time. She appears well-developed and well-nourished.  HENT:  Head: Normocephalic and atraumatic.  Cardiovascular: Normal rate, regular rhythm and normal heart sounds.   No murmur heard. Pulmonary/Chest: Effort normal and breath sounds normal. No respiratory distress. She has no wheezes.  Musculoskeletal: She exhibits no edema.  Neurological: She is alert and oriented to person, place, and time.  Psychiatric: She has a normal mood and affect. Her behavior is normal. Judgment and thought content normal.          Assessment & Plan:  Pneumovax booster today.   Addendum: pt returned to the office today at [facility] so we could look at her right arm. Notes pain with right arm movement and swelling.  Had pneumovax in the right arm this AM.  Now has some swelling of right upper arm, no erythema, no ecchymosis.  + tenderness. Temp 98.1.  Advised pt to take ibuprofen for pain, warm compresses prn.  Call if fever, increased pain/redness/swelling.  Pt verbalizes understanding.

## 2016-05-01 ENCOUNTER — Telehealth: Payer: Self-pay | Admitting: *Deleted

## 2016-05-01 NOTE — Telephone Encounter (Signed)
Left message for pt to call and let us know how she is doing from her reaction to the pneumococcal 23 vaccine.

## 2016-05-05 ENCOUNTER — Telehealth: Payer: Self-pay | Admitting: Family

## 2016-05-05 NOTE — Telephone Encounter (Signed)
Caller name: Relationship to patient: Self Can be reached: [phone] Pharmacy:  Reason for call: Patient request call back to discuss possible reaction to Pneumo Vac

## 2016-05-05 NOTE — Telephone Encounter (Signed)
Ronny Flurry, CMA 4 days ago   Left message for pt to call and let us know how she is doing from her reaction to the pneumococcal 23 vaccine.

## 2016-05-05 NOTE — Telephone Encounter (Signed)
Patient called back to follow up on Rx. States she is going out of town

## 2016-05-07 ENCOUNTER — Encounter (HOSPITAL_BASED_OUTPATIENT_CLINIC_OR_DEPARTMENT_OTHER): Payer: Self-pay | Admitting: Emergency Medicine

## 2016-05-07 ENCOUNTER — Emergency Department (HOSPITAL_BASED_OUTPATIENT_CLINIC_OR_DEPARTMENT_OTHER)
Admission: EM | Admit: 2016-05-07 | Discharge: 2016-05-07 | Disposition: A | Discharge status: Home / Self Care | Payer: Medicare Other | Attending: Emergency Medicine | Admitting: Emergency Medicine

## 2016-05-07 DIAGNOSIS — T7840XA Allergy, unspecified, initial encounter: Secondary | ICD-10-CM | POA: Diagnosis not present

## 2016-05-07 DIAGNOSIS — E039 Hypothyroidism, unspecified: Secondary | ICD-10-CM | POA: Insufficient documentation

## 2016-05-07 DIAGNOSIS — R21 Rash and other nonspecific skin eruption: Secondary | ICD-10-CM | POA: Diagnosis present

## 2016-05-07 DIAGNOSIS — I2510 Atherosclerotic heart disease of native coronary artery without angina pectoris: Secondary | ICD-10-CM | POA: Insufficient documentation

## 2016-05-07 DIAGNOSIS — Z79899 Other long term (current) drug therapy: Secondary | ICD-10-CM | POA: Diagnosis not present

## 2016-05-07 DIAGNOSIS — Z7982 Long term (current) use of aspirin: Secondary | ICD-10-CM | POA: Diagnosis not present

## 2016-05-07 MED ORDER — PREDNISONE 10 MG PO TABS
60.0000 mg | ORAL_TABLET | Freq: Once | ORAL | Status: AC
  Administered 2016-05-07: 60 mg via ORAL
  Filled 2016-05-07: qty 1

## 2016-05-07 MED ORDER — IPRATROPIUM-ALBUTEROL 0.5-2.5 (3) MG/3ML IN SOLN
3.0000 mL | RESPIRATORY_TRACT | Status: AC
  Administered 2016-05-07 (×3): 3 mL via RESPIRATORY_TRACT
  Filled 2016-05-07: qty 6
  Filled 2016-05-07: qty 3

## 2016-05-07 MED ORDER — RANITIDINE HCL 150 MG/10ML PO SYRP
150.0000 mg | ORAL_SOLUTION | Freq: Once | ORAL | Status: AC
  Administered 2016-05-07: 150 mg via ORAL
  Filled 2016-05-07: qty 10

## 2016-05-07 MED ORDER — PREDNISONE 10 MG PO TABS: 40.0000 mg | ORAL_TABLET | Freq: Every day | ORAL | 0 refills | Status: DC

## 2016-05-07 NOTE — ED Notes (Signed)
Pt states she feels better and thinks the redness is resolving. Reports some throat discomfort "? From swallowing both pills at the same time." Given crackers and something to drink.

## 2016-05-07 NOTE — ED Triage Notes (Signed)
Pt in c/o bee sting to R forearm x 30 min. Has hx of severe reaction to this in the past. Pt took 50 mg benadryl PTA. Pt is alert, interactive, ambulatory in NAD.

## 2016-05-07 NOTE — ED Notes (Addendum)
Patient states around 1.5 hours PTA to ED she was stung by a hornet or wasp of some sort on the right posterior forearm.  Shortly thereafter, patient developed hives to her trunk and extremities.  Patient also has an URI and is currently being treated with abx from her PCP.  Patient states she has nasal drainage and cough from that, but no increased shortness of breath since the bee sting.  Patient denies N/V/D and fever.  Patient's lung sounds, mild expiratory wheezing, no stridor.  Patient's lips are mildly swollen inside, but her tongue is not edematous.  Patient is maintaining secretions and her O2 saturation on room air is 98-100%.

## 2016-05-07 NOTE — ED Provider Notes (Signed)
[facility] DEPT [facility] Provider Note   CSN: [id number] Arrival date & time: 05/07/16  1558  By signing my name below, I, Neta Mends, attest that this documentation has been prepared under the direction and in the presence of Gareth Morgan, MD . Electronically Signed: Neta Mends, ED Scribe. 05/07/2016. 4:39 PM.   History   Chief Complaint Chief Complaint  Patient presents with  . Insect Bite  . Allergic Reaction    The history is provided by the patient. No language interpreter was used.  HPI Comments:  Madeline Schaefer is a 70 y.o. female with PMHx of bronchitis and dyspnea who presents to the Emergency Department complaining of a bee sting that she sustained to her right forearm 1 hour ago. Pt notes worsening swelling to her upper lip and rash to her right forarm and diffusely on abdomen, under arms. Pt states that the rash is itchy. Pt reports a history of severe rash to bee stings previously, and had a similar sting 1 year ago. Pt states that she has never had to use an epi-pen before, but does own one. Pt took 50mg  of benadryl PTA with no relief.  Pt presented yesterday she had cough, nasal congestion, wheezing/"rattling" was given a steroid shot at [facility] yesterday and initiated on azithromycin. Pt denies SOB, vocal change, nausea, vomiting, diarrhea, abdominal pain, trouble swallowing, lightheadedness.   Past Medical History:  Diagnosis Date  . Blood transfusion without reported diagnosis   . BRONCHITIS   . CAD (coronary artery disease)    LAD stent 2003 ([facility]).    . Cataract   . DYSPNEA   . HLD (hyperlipidemia)   . Hypothyroidism   . Mobitz type 2 second degree heart block    s/p PPM by Dr Olevia Perches 2004  . PACEMAKER, PERMANENT     Patient Active Problem List   Diagnosis Date Noted  . Osteopenia 11/16/2014  . Adjustment disorder with mixed anxiety and depressed mood 09/26/2013  . Essential hypertension 08/21/2013  . Special screening for malignant  neoplasms, colon 08/19/2013  . Hematuria 11/20/2012  . Hyperglycemia 11/20/2012  . Eczema 11/20/2012  . Routine general medical examination at a health care facility 11/20/2012  . Second degree Mobitz II AV block 01/27/2011  . DYSPNEA 12/15/2009  . PACEMAKER-[employer] 12/01/2008  . Hypothyroidism 08/03/2008  . Hyperlipidemia 08/03/2008  . Coronary atherosclerosis 08/03/2008    Past Surgical History:  Procedure Laterality Date  . CARDIAC CATHETERIZATION    . CYSTOSCOPY WITH RETROGRADE PYELOGRAM, URETEROSCOPY AND STENT PLACEMENT  08/19/2012   Procedure: CYSTOSCOPY WITH RETROGRADE PYELOGRAM, URETEROSCOPY AND STENT PLACEMENT;  Surgeon: Molli Hazard, MD;  Location: [facility];  Service: Urology;  Laterality: Right;  . MYOMECTOMY    . PACEMAKER GENERATOR CHANGE N/A 12/22/2011   Procedure: PACEMAKER GENERATOR CHANGE;  Surgeon: Thompson Grayer, MD;  Location: [facility] CATH LAB;  Service: Cardiovascular;  Laterality: N/A;  . PACEMAKER INSERTION  2004, 12/22/11   initial implant by Dr Olevia Perches, Generator change (MDT Adapta L) by Dr Rayann Heman 6/13  . TOTAL ABDOMINAL HYSTERECTOMY W/ BILATERAL SALPINGOOPHORECTOMY      OB History    No data available       Home Medications    Prior to Admission medications   Medication Sig Start Date End Date Taking? Authorizing Provider  aspirin EC 81 MG tablet Take 81 mg by mouth daily.    Historical Provider, MD  atorvastatin (LIPITOR) 40 MG tablet Take 1 tablet (40 mg total) by mouth  daily. 03/03/16   [name], NP  Cholecalciferol (VITAMIN D3) 1000 UNITS CAPS Take 2 capsules by mouth daily.    Historical Provider, MD  clobetasol ointment (TEMOVATE) AB-123456789 % Apply 1 application topically daily as needed (eczema).  09/03/13   Historical Provider, MD  EPINEPHrine 0.3 mg/0.3 mL IJ SOAJ injection Inject 0.3 mLs (0.3 mg total) into the muscle once. 08/06/15   [name], NP  fluocinonide ointment (LIDEX) AB-123456789 % Apply 1 application topically  as directed.  08/26/13   Historical Provider, MD  levothyroxine (SYNTHROID, LEVOTHROID) 88 MCG tablet TAKE ONE TABLET BY MOUTH ONCE DAILY BEFORE  BREAKFAST 03/03/16   [name], NP  lisinopril (PRINIVIL,ZESTRIL) 10 MG tablet Take 1 tablet (10 mg total) by mouth daily. 04/28/16   [name], NP  Omega-3 Fatty Acids (FISH OIL) 1000 MG CAPS Take 3 capsules by mouth daily.    Historical Provider, MD  predniSONE (DELTASONE) 10 MG tablet Take 4 tablets (40 mg total) by mouth daily. 05/07/16 05/09/16  Gareth Morgan, MD    Family History Family History  Problem Relation Age of Onset  . Heart attack Mother   . Prostate cancer Father     Social History Social History  Substance Use Topics  . Smoking status: Never Smoker  . Smokeless tobacco: Never Used  . Alcohol use Yes     Comment: 1 drink daily     Allergies   Codeine; Pneumovax 23 [pneumococcal vac polyvalent]; Hornet venom; and Sulfonamide derivatives   Review of Systems Review of Systems  Constitutional: Negative for fever.  HENT: Positive for congestion and facial swelling. Negative for sore throat, trouble swallowing and voice change.   Eyes: Negative for visual disturbance.  Respiratory: Positive for cough. Negative for shortness of breath.   Cardiovascular: Negative for chest pain.  Gastrointestinal: Negative for abdominal pain, diarrhea, nausea and vomiting.  Genitourinary: Negative for difficulty urinating.  Musculoskeletal: Negative for back pain and neck pain.  Skin: Positive for rash.  Neurological: Negative for syncope, light-headedness and headaches.  All other systems reviewed and are negative.    Physical Exam Updated Vital Signs BP 129/72 (BP Location: Right Arm)   Pulse 78   Temp 98.6 F (37 C) (Oral)   Resp 19   Ht 5\' 10"  (1.778 m)   Wt 167 lb (75.8 kg)   LMP 10/17/1986   SpO2 98%   BMI 23.96 kg/m   Physical Exam  Constitutional: She appears well-developed and well-nourished. No  distress.  HENT:  Head: Normocephalic and atraumatic.  Mouth/Throat: Oropharynx is clear and moist. No oropharyngeal exudate.  No signs of tongue swelling. Mild swelling to lips.  Eyes: Conjunctivae are normal.  Neck: Neck supple.  Cardiovascular: Normal rate and regular rhythm.   No murmur heard. Pulmonary/Chest: Effort normal. No respiratory distress. She has wheezes.  Diffuse wheezing  Abdominal: Soft. There is no tenderness.  Musculoskeletal: She exhibits no edema.  Neurological: She is alert.  Skin: Skin is warm and dry.  3cm area of induration on right forearm. Scattered urticaria to bilateral axilla, across abdomen, and under breasts.   Psychiatric: She has a normal mood and affect.  Nursing note and vitals reviewed.    ED Treatments / Results  DIAGNOSTIC STUDIES:  Oxygen Saturation is 99% on RA, normal by my interpretation.    COORDINATION OF CARE:  4:39 PM Discussed treatment plan with pt at bedside and pt agreed to plan.   Labs (all labs ordered are listed, but only abnormal results  are displayed) Labs Reviewed - No data to display  EKG  EKG Interpretation None       Radiology No results found.  Procedures Procedures (including critical care time)  Medications Ordered in ED Medications  predniSONE (DELTASONE) tablet 60 mg (60 mg Oral Given 05/07/16 1705)  ranitidine (ZANTAC) 150 MG/10ML syrup 150 mg (150 mg Oral Given 05/07/16 1705)  ipratropium-albuterol (DUONEB) 0.5-2.5 (3) MG/3ML nebulizer solution 3 mL (3 mLs Nebulization Given 05/07/16 1843)     Initial Impression / Assessment and Plan / ED Course  I have reviewed the triage vital signs and the nursing notes.  Pertinent labs & imaging results that were available during my care of the patient were reviewed by me and considered in my medical decision making (see chart for details).  Clinical Course   70yo female with history of CAD, pacemaker placement, htn, presents with concern for  allergic reaction to bee sting. Pt also seen yesterday at urgent care for cough, nasal congestion, and was given azithromycin and steroid injection.  Timing of symptoms, however consistent with bee sting reaction.  Patient with rash, mild lip swelling.  Suspect cough/wheezing are secondary to URI that brought patient to urgent care yesterday, and overall do not feel these symptoms are secondary to anaphylaxis, and given this feel risk of epi outweighs benefits.  Gave prednisone, ranitidine (pt took benadryl at home) and duonebs with improvement in rash and wheezing. Observed in ED with continued improvement in symptoms.   Given rx for prednisone x 2 days, recommend benadryl/zantac. Pt with epi-pen at home and again discussed indications for use. Patient discharged in stable condition with understanding of reasons to return.     Final Clinical Impressions(s) / ED Diagnoses   Final diagnoses:  Allergic reaction, initial encounter    New Prescriptions Discharge Medication List as of 05/07/2016  6:59 PM    START taking these medications   Details  predniSONE (DELTASONE) 10 MG tablet Take 4 tablets (40 mg total) by mouth daily., Starting Sun 05/07/2016, Until Tue 05/09/2016, Print      I personally performed the services described in this documentation, which was scribed in my presence. The recorded information has been reviewed and is accurate.     Gareth Morgan, MD 05/08/16 [phone]

## 2016-05-07 NOTE — Discharge Instructions (Signed)
Take benadryl 25mg  every 6 hours for 48 hours and ranitidine 150mg  daily for 2 days.

## 2016-05-07 NOTE — ED Notes (Signed)
Patient resting comfortably with husband at bedside.  Hives are much improved.  Lip swelling improved, but states states her lips are sore.

## 2016-05-08 ENCOUNTER — Telehealth: Payer: Self-pay | Admitting: Family

## 2016-05-08 NOTE — Telephone Encounter (Signed)
Error

## 2016-05-08 NOTE — Telephone Encounter (Signed)
Spoke with patient. Advised her that I did not receive the message on Friday re:  Her calls, but apologized for not getting back to her. Advised her that I would address this with staff.  She reports that the area where she was stung is still swollen and itching. She is using benadryl for itching which is helping and is scheduled to take prednisone 40mg  today and tomorrow. I have scheduled her an appointment for tomorrow to re-assess. She will cancel in the AM if significant improvement.

## 2016-05-08 NOTE — Telephone Encounter (Signed)
Patient called stated she was very upset that she called Friday twice and no one returned her call. Michela Pitcher this is not like our office to not respond to her. Left two messages for [place] to call her back and ended up having to go to an Urgent Care for treatment. Said she is feeling much better now but did want [place] to know she was very disappointed. I apologized for her experience and I asked her was there any other Health concerns that we need to address for her at this time. She said no she just wanted [place] to know what happened. I told her if she had any other concerns to call me back and that I would notify her provider of her concerns as she requested.

## 2016-05-09 ENCOUNTER — Ambulatory Visit (INDEPENDENT_AMBULATORY_CARE_PROVIDER_SITE_OTHER): Payer: Medicare Other | Admitting: Family

## 2016-05-09 ENCOUNTER — Encounter: Payer: Self-pay | Admitting: Family

## 2016-05-09 VITALS — BP 126/72 | HR 95 | Temp 97.8°F | Resp 16 | Ht 70.0 in | Wt 171.0 lb

## 2016-05-09 DIAGNOSIS — T7840XA Allergy, unspecified, initial encounter: Secondary | ICD-10-CM | POA: Diagnosis not present

## 2016-05-09 MED ORDER — PREDNISONE 10 MG PO TABS: ORAL_TABLET | ORAL | 0 refills | Status: DC

## 2016-05-09 NOTE — Progress Notes (Signed)
Subjective:    Patient ID: Madeline Schaefer, female    DOB: May 11, 1946, 70 y.o.   MRN: [id number]  HPI  Madeline Schaefer is a 70 yr old female who presents today for follow up of her bee sting.  She was seen in the ED on 10//2 following allergic reaction to a "yellow jacket"  Sting to her right forearm.. ED record is reviewed. She has urticaria and lip swelling as a result of the bee sting and brings me pictures today showing me.   She was discharged home on oral prednisone. She reports that the arm is still a bit itchy (she continues to use benadryl prn) but that the swelling and redness is improved. She denies SOB.    Review of Systems See HPI  Past Medical History:  Diagnosis Date  . Blood transfusion without reported diagnosis   . BRONCHITIS   . CAD (coronary artery disease)    LAD stent 2003 ([facility]).    . Cataract   . DYSPNEA   . HLD (hyperlipidemia)   . Hypothyroidism   . Mobitz type 2 second degree heart block    s/p PPM by Dr Olevia Perches 2004  . PACEMAKER, PERMANENT      Social History   Social History  . Marital status: Married    Spouse name: N/A  . Number of children: 0  . Years of education: N/A   Occupational History  . Not on file.   Social History Main Topics  . Smoking status: Never Smoker  . Smokeless tobacco: Never Used  . Alcohol use Yes     Comment: 1 drink daily  . Drug use: Unknown  . Sexual activity: Yes   Other Topics Concern  . 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    Past Surgical History:  Procedure Laterality Date  . CARDIAC CATHETERIZATION    . CYSTOSCOPY WITH RETROGRADE PYELOGRAM, URETEROSCOPY AND STENT PLACEMENT  08/19/2012   Procedure: CYSTOSCOPY WITH RETROGRADE PYELOGRAM, URETEROSCOPY AND STENT PLACEMENT;  Surgeon: Molli Hazard, MD;  Location: [facility];  Service: Urology;  Laterality: Right;  . MYOMECTOMY    . PACEMAKER GENERATOR CHANGE N/A  12/22/2011   Procedure: PACEMAKER GENERATOR CHANGE;  Surgeon: Thompson Grayer, MD;  Location: [facility] CATH LAB;  Service: Cardiovascular;  Laterality: N/A;  . PACEMAKER INSERTION  2004, 12/22/11   initial implant by Dr Olevia Perches, Generator change (MDT Adapta L) by Dr Rayann Heman 6/13  . TOTAL ABDOMINAL HYSTERECTOMY W/ BILATERAL SALPINGOOPHORECTOMY      Family History  Problem Relation Age of Onset  . Heart attack Mother   . Prostate cancer Father     Allergies  Allergen Reactions  . Codeine Other (See Comments)    REACTION: Passed  out, facial swelling and hives  . Pneumovax 23 [Pneumococcal Vac Polyvalent] Swelling    Local swelling and pain  . Hornet Venom Hives    Hives over entire body  . Sulfonamide Derivatives Hives    Current Outpatient Prescriptions on File Prior to Visit  Medication Sig Dispense Refill  . aspirin EC 81 MG tablet Take 81 mg by mouth daily.    Marland Kitchen atorvastatin (LIPITOR) 40 MG tablet Take 1 tablet (40 mg total) by mouth daily. 90 tablet 1  . Cholecalciferol (VITAMIN D3) 1000 UNITS CAPS Take 2 capsules by mouth daily.    . clobetasol ointment (TEMOVATE) AB-123456789 % Apply 1 application topically daily as  needed (eczema).     . EPINEPHrine 0.3 mg/0.3 mL IJ SOAJ injection Inject 0.3 mLs (0.3 mg total) into the muscle once. 2 Device 1  . fluocinonide ointment (LIDEX) AB-123456789 % Apply 1 application topically as directed.     Marland Kitchen levothyroxine (SYNTHROID, LEVOTHROID) 88 MCG tablet TAKE ONE TABLET BY MOUTH ONCE DAILY BEFORE  BREAKFAST 90 tablet 1  . lisinopril (PRINIVIL,ZESTRIL) 10 MG tablet Take 1 tablet (10 mg total) by mouth daily. 30 tablet 3  . Omega-3 Fatty Acids (FISH OIL) 1000 MG CAPS Take 3 capsules by mouth daily.     No current facility-administered medications on file prior to visit.     BP 126/72 (BP Location: Left Arm, Cuff Size: Normal)   Pulse 95   Temp 97.8 F (36.6 C) (Oral)   Resp 16   Ht 5\' 10"  (1.778 m)   Wt 171 lb (77.6 kg)   LMP 10/17/1986   SpO2 97% Comment: room  air  BMI 24.54 kg/m       Objective:   Physical Exam  Constitutional: She appears well-developed and well-nourished. No distress.  HENT:  Head: Normocephalic and atraumatic.  No tongue/lip swelling noted  Cardiovascular: Normal rate and regular rhythm.   No murmur heard. Pulmonary/Chest: Effort normal and breath sounds normal. No respiratory distress. She has no wheezes. She has no rales. She exhibits no tenderness.  Skin: Skin is warm and dry.  Resolution of swelling of the right upper arm from previous pneumovax reaction  Mild erythema and mild swelling of the right forearm.          Assessment & Plan:  Allergic reaction to bee sting- Improving. Will rx with a few more days of prednisone taper. Continue benadryl prn. Call if new/worsening symptoms or if symptoms do not improve. We discussed proper administration of epi pen and that she should keep it with her at all times.

## 2016-05-09 NOTE — Patient Instructions (Signed)
Continue prednisone for 3 more days (rx has been sent to your pharmacy). Keep your epipen with you at all times.   Continue benadryl as needed. Let us know if swelling/redness/itching does not continue to improve.

## 2016-05-09 NOTE — Progress Notes (Signed)
Pre visit review using our clinic review tool, if applicable. No additional management support is needed unless otherwise documented below in the visit note. 

## 2016-05-17 ENCOUNTER — Other Ambulatory Visit: Payer: Self-pay

## 2016-05-17 ENCOUNTER — Ambulatory Visit (HOSPITAL_COMMUNITY): Payer: Medicare Other | Attending: Cardiovascular Disease

## 2016-05-17 DIAGNOSIS — I10 Essential (primary) hypertension: Secondary | ICD-10-CM | POA: Insufficient documentation

## 2016-05-24 ENCOUNTER — Ambulatory Visit (INDEPENDENT_AMBULATORY_CARE_PROVIDER_SITE_OTHER): Payer: Medicare Other | Admitting: Family

## 2016-05-24 VITALS — BP 136/79 | HR 71

## 2016-05-24 DIAGNOSIS — I10 Essential (primary) hypertension: Secondary | ICD-10-CM | POA: Diagnosis not present

## 2016-05-24 LAB — BASIC METABOLIC PANEL
BUN: 22 mg/dL (ref 6–23)
CALCIUM: 9.2 mg/dL (ref 8.4–10.5)
CO2: 31 meq/L (ref 19–32)
CREATININE: 0.82 mg/dL (ref 0.40–1.20)
Chloride: 102 mEq/L (ref 96–112)
GFR: 73.28 mL/min (ref >60.00)
Glucose, Bld: 95 mg/dL (ref 70–99)
Potassium: 4.3 mEq/L (ref 3.5–5.1)
Sodium: 135 mEq/L (ref 135–145)

## 2016-05-24 NOTE — Progress Notes (Signed)
Pre visit review using our clinic tool,if applicable. No additional management support is needed unless otherwise documented below in the visit note.   Patient in for BP check.  BP = 136/79 Puulse = 71

## 2016-05-24 NOTE — Patient Instructions (Signed)
Patient to have BMET drawn today. Continue taking Lisinopril 10 mg daily. Return to office in 3 months for follow up visit. Please call and schedule appointment.

## 2016-05-26 NOTE — Progress Notes (Signed)
She has an appt with me on Monday.  I will talk to her about it.  Thanks.

## 2016-05-28 NOTE — Progress Notes (Signed)
HPI The patient presents for follow up of CAD.  She has seen Dr. Rayann Heman for her pacemaker since I last saw her.  She also was in [place] for an extended trip.   She has a distant history of stenting to her LAD. She presents for follow up.  Of note Dr. Rayann Heman did send her for an echocardiogram because she had a slightly reduced ejection fraction previously I was following this clinically. I reviewed this today. He does seem to be still slightly low at 45-50% which may be about the same or perhaps slightly lower than before. She has some moderate aortic insufficiency which might increase over the last couple of years. She has some mild to moderate mitral regurgitation. However, the patient is asymptomatic. She was very active while traveling. She does a lot of hiking. She exercises routinely. The patient denies any new symptoms such as chest discomfort, neck or arm discomfort. There has been no new shortness of breath, PND or orthopnea. There have been no reported palpitations, presyncope or syncope.  Allergies  Allergen Reactions  . Bee Venom Anaphylaxis  . Codeine Other (See Comments)    REACTION: Passed  out, facial swelling and hives  . Pneumovax 23 [Pneumococcal Vac Polyvalent] Swelling    Local swelling and pain  . Hornet Venom Hives    Hives over entire body  . Sulfonamide Derivatives Hives    Current Outpatient Prescriptions  Medication Sig Dispense Refill  . aspirin EC 81 MG tablet Take 81 mg by mouth daily.    Marland Kitchen atorvastatin (LIPITOR) 40 MG tablet Take 1 tablet (40 mg total) by mouth daily. 90 tablet 1  . benzonatate (TESSALON) 200 MG capsule Take 200 mg by mouth 3 (three) times daily as needed for cough.    . Cholecalciferol (VITAMIN D3) 1000 UNITS CAPS Take 2 capsules by mouth daily.    . clobetasol ointment (TEMOVATE) AB-123456789 % Apply 1 application topically daily as needed (eczema).     . EPINEPHrine 0.3 mg/0.3 mL IJ SOAJ injection Inject 0.3 mLs (0.3 mg total) into the muscle  once. 2 Device 1  . fluocinonide ointment (LIDEX) AB-123456789 % Apply 1 application topically as directed.     Marland Kitchen levothyroxine (SYNTHROID, LEVOTHROID) 88 MCG tablet TAKE ONE TABLET BY MOUTH ONCE DAILY BEFORE  BREAKFAST 90 tablet 1  . lisinopril (PRINIVIL,ZESTRIL) 10 MG tablet Take 1 tablet (10 mg total) by mouth daily. 30 tablet 3  . Omega-3 Fatty Acids (FISH OIL) 1000 MG CAPS Take 3 capsules by mouth daily.    . predniSONE (DELTASONE) 10 MG tablet 3 tabs by mouth 10/25, 2 tabs by mouth 10/26, 1 tab by mouth 10/27 6 tablet 0   No current facility-administered medications for this visit.     Past Medical History:  Diagnosis Date  . Blood transfusion without reported diagnosis   . BRONCHITIS   . CAD (coronary artery disease)    LAD stent 2003 ([facility]).    . Cataract   . DYSPNEA   . HLD (hyperlipidemia)   . Hypothyroidism   . Mobitz type 2 second degree heart block    s/p PPM by Dr Olevia Perches 2004  . PACEMAKER, PERMANENT     Past Surgical History:  Procedure Laterality Date  . CARDIAC CATHETERIZATION    . CYSTOSCOPY WITH RETROGRADE PYELOGRAM, URETEROSCOPY AND STENT PLACEMENT  08/19/2012   Procedure: CYSTOSCOPY WITH RETROGRADE PYELOGRAM, URETEROSCOPY AND STENT PLACEMENT;  Surgeon: Molli Hazard, MD;  Location: [facility];  Service: Urology;  Laterality: Right;  . MYOMECTOMY    . PACEMAKER GENERATOR CHANGE N/A 12/22/2011   Procedure: PACEMAKER GENERATOR CHANGE;  Surgeon: Thompson Grayer, MD;  Location: [facility] CATH LAB;  Service: Cardiovascular;  Laterality: N/A;  . PACEMAKER INSERTION  2004, 12/22/11   initial implant by Dr Olevia Perches, Generator change (MDT Adapta L) by Dr Rayann Heman 6/13  . TOTAL ABDOMINAL HYSTERECTOMY W/ BILATERAL SALPINGOOPHORECTOMY      ROS:   As stated in the HPI and negative for all other systems.  PHYSICAL EXAM BP 140/88 (BP Location: Right Arm, Patient Position: Sitting, Cuff Size: Normal)   Pulse 74   Ht 5\' 10"  (1.778 m)   Wt 170 lb 9.6 oz (77.4 kg)   LMP  10/17/1986   BMI 24.48 kg/m  GENERAL:  Well appearing NECK:  No jugular venous distention, waveform within normal limits, carotid upstroke brisk and symmetric, no bruits, no thyromegaly LYMPHATICS:  No cervical, inguinal adenopathy LUNGS:  Clear to auscultation bilaterally BACK:  No CVA tenderness CHEST:  Well healed pacemaker scar. HEART:  PMI not displaced or sustained,S1 and S2 within normal limits, no S3, no S4, no clicks, no rubs, Very brief soft apical systolic, no diastolic murmurs ABD:  Flat, positive bowel sounds normal in frequency in pitch, no bruits, no rebound, no guarding, no midline pulsatile mass, no hepatomegaly, no splenomegaly EXT:  2 plus pulses throughout, no edema, no cyanosis no clubbing  EKG:  Sinus rhythm, rate 74, ventricular pacing 100% capture  05/29/2016  ASSESSMENT AND PLAN  CAD:  She is having no symptoms. No further testing is indicated.    STATUS POST PACEMAKER:  She has had routine followup with Dr. Rayann Heman  HTN:  The blood pressure is mildly elevated.  However, she was only taking half of her lisinopril has restarted the full dose and she is encouraged to keep a blood pressure diary.  DYSLIPIDEMIA:    She will continue with her Lipitor.  Her LDL was slightly elevated but her HDL was 50.    REDUCED EF:    This will be followed up as below. her ejection fraction was perhaps slightly lower than it was a half years ago. However, she's asymptomatic and active.   AI/MR:  I will follow this up with an echo in 6 months.

## 2016-05-29 ENCOUNTER — Telehealth: Payer: Self-pay | Admitting: Cardiology

## 2016-05-29 ENCOUNTER — Ambulatory Visit (INDEPENDENT_AMBULATORY_CARE_PROVIDER_SITE_OTHER): Payer: Medicare Other | Admitting: *Deleted

## 2016-05-29 ENCOUNTER — Ambulatory Visit (INDEPENDENT_AMBULATORY_CARE_PROVIDER_SITE_OTHER): Payer: Medicare Other | Admitting: Cardiology

## 2016-05-29 VITALS — BP 140/88 | HR 74 | Ht 70.0 in | Wt 170.6 lb

## 2016-05-29 DIAGNOSIS — I340 Nonrheumatic mitral (valve) insufficiency: Secondary | ICD-10-CM | POA: Diagnosis not present

## 2016-05-29 DIAGNOSIS — I441 Atrioventricular block, second degree: Secondary | ICD-10-CM | POA: Diagnosis not present

## 2016-05-29 DIAGNOSIS — I351 Nonrheumatic aortic (valve) insufficiency: Secondary | ICD-10-CM | POA: Diagnosis not present

## 2016-05-29 DIAGNOSIS — I25810 Atherosclerosis of coronary artery bypass graft(s) without angina pectoris: Secondary | ICD-10-CM | POA: Diagnosis not present

## 2016-05-29 NOTE — Telephone Encounter (Signed)
LMOVM reminding pt to send remote transmission.   

## 2016-05-29 NOTE — Telephone Encounter (Signed)
Pt called and wanted to know if she needs to keep her follow up appt with AS on 06-05-16. She said she seen Dr. Jenkins Rouge today and he reviewed her ECHO. Please call her back and let her know if she needs to keep this appt.

## 2016-05-29 NOTE — Patient Instructions (Addendum)
Medication Instructions:  INCREASE- Lisinopril 1 tablets daily  Labwork: None Ordered  Testing/Procedures: Your physician has requested that you have an echocardiogram in 6 Months. Echocardiography is a painless test that uses sound waves to create images of your heart. It provides your doctor with information about the size and shape of your heart and how well your heart's chambers and valves are working. This procedure takes approximately one hour. There are no restrictions for this procedure.  Follow-Up: Your physician wants you to follow-up in:  6 Months.  You will receive a reminder letter in the mail two months in advance. If you don't receive a letter, please call our office to schedule the follow-up appointment.   Any Other Special Instructions Will Be Listed Below (If Applicable).         Happy Thanksgiving   If you need a refill on your cardiac medications before your next appointment, please call your pharmacy.

## 2016-05-29 NOTE — Telephone Encounter (Signed)
SPOKE TO PT ABOUT RECEIVING MESSAGE AND WILL CONTACT BACK ONCE SEILER NOTIFIES THE STATUS OF KEEPING APPT.

## 2016-05-30 ENCOUNTER — Encounter: Payer: Self-pay | Admitting: *Deleted

## 2016-05-30 ENCOUNTER — Telehealth: Payer: Self-pay | Admitting: *Deleted

## 2016-05-30 NOTE — Telephone Encounter (Signed)
LMOVM FOR PATIENT THAT SEILER AGREED THAT APPOINTMENT SHOULD BE CANCELLED AND KEEP FOLLOW UP RECALL IN MAY 2018  WITH HER.  CLINIC NUMBER WAS LEFT FOR CONTACT BACK WITH ANY QUESTIONS OR  CONCERNS.

## 2016-05-30 NOTE — Telephone Encounter (Signed)
No need to keep appt with me since echo reviewed with patient by Dr Percival Spanish. Folllow up for pacemaker check in May as scheduled.  Chanetta Marshall, NP 05/30/2016 6:59 AM

## 2016-05-30 NOTE — Progress Notes (Signed)
Remote pacemaker transmission.   

## 2016-05-31 ENCOUNTER — Encounter: Payer: Self-pay | Admitting: Cardiology

## 2016-06-05 ENCOUNTER — Encounter: Payer: Medicare Other | Admitting: Nurse Practitioner

## 2016-06-09 LAB — CUP PACEART REMOTE DEVICE CHECK
Battery Impedance: 305 Ohm
Battery Voltage: 2.79 V
Brady Statistic AP VP Percent: 3 %
Brady Statistic AP VS Percent: 0 %
Brady Statistic AS VS Percent: 0 %
Implantable Lead Implant Date: 20040907
Implantable Lead Implant Date: 20040907
Implantable Lead Location: 753860
Implantable Lead Model: 5076
Lead Channel Impedance Value: 437 Ohm
Lead Channel Impedance Value: 701 Ohm
Lead Channel Pacing Threshold Pulse Width: 0.4 ms
Lead Channel Sensing Intrinsic Amplitude: 1 mV
Lead Channel Setting Pacing Amplitude: 2.5 V
MDC IDC LEAD LOCATION: 753859
MDC IDC MSMT BATTERY REMAINING LONGEVITY: 104 mo
MDC IDC MSMT LEADCHNL RA PACING THRESHOLD AMPLITUDE: 0.5 V
MDC IDC MSMT LEADCHNL RV PACING THRESHOLD AMPLITUDE: 0.75 V
MDC IDC MSMT LEADCHNL RV PACING THRESHOLD PULSEWIDTH: 0.4 ms
MDC IDC PG IMPLANT DT: 20130607
MDC IDC SESS DTM: 20171113175205
MDC IDC SET LEADCHNL RA PACING AMPLITUDE: 2 V
MDC IDC SET LEADCHNL RV PACING PULSEWIDTH: 0.4 ms
MDC IDC SET LEADCHNL RV SENSING SENSITIVITY: 2 mV
MDC IDC STAT BRADY AS VP PERCENT: 97 %

## 2016-06-27 ENCOUNTER — Telehealth: Payer: Self-pay | Admitting: Family

## 2016-06-27 DIAGNOSIS — E785 Hyperlipidemia, unspecified: Secondary | ICD-10-CM

## 2016-06-27 MED ORDER — LISINOPRIL 10 MG PO TABS: 10.0000 mg | ORAL_TABLET | Freq: Every day | ORAL | 1 refills | Status: DC

## 2016-06-27 NOTE — Telephone Encounter (Signed)
Patient call requesting a refill of lisinopril (PRINIVIL,ZESTRIL) 10 MG tablet  Patient is requesting a 90 day supply. Please advise   Pharmacy: [facility] [address] [place]

## 2016-06-27 NOTE — Telephone Encounter (Signed)
Refill sent.

## 2016-06-27 NOTE — Telephone Encounter (Signed)
Notified pt. 

## 2016-07-11 ENCOUNTER — Telehealth: Payer: Self-pay

## 2016-07-11 ENCOUNTER — Encounter: Payer: Self-pay | Admitting: Family Medicine

## 2016-07-11 ENCOUNTER — Ambulatory Visit (INDEPENDENT_AMBULATORY_CARE_PROVIDER_SITE_OTHER): Payer: Medicare Other | Admitting: Family Medicine

## 2016-07-11 VITALS — BP 132/76 | HR 74 | Temp 97.9°F | Resp 16 | Ht 70.0 in | Wt 171.0 lb

## 2016-07-11 DIAGNOSIS — J069 Acute upper respiratory infection, unspecified: Secondary | ICD-10-CM | POA: Diagnosis not present

## 2016-07-11 MED ORDER — FLUTICASONE PROPIONATE 50 MCG/ACT NA SUSP
2.0000 | Freq: Every day | NASAL | 6 refills | Status: DC
Start: 2016-07-11 — End: 2017-01-19

## 2016-07-11 MED ORDER — LEVOCETIRIZINE DIHYDROCHLORIDE 5 MG PO TABS: 5.0000 mg | ORAL_TABLET | Freq: Every evening | ORAL | 5 refills | Status: DC

## 2016-07-11 NOTE — Telephone Encounter (Signed)
[employer] note received via fax  Call:   Date: 07/08/16   Time: 8:34am   Caller:  Self  Return number:  [phone]  Nurse: Ave Filter, RN  Chief Complaint:  Cough  Reason for call:  Caller states she has cough and congestion  Related visit to physician within the last 2 weeks:  No  Guideline:  Cough--Acute Non-productive  Disposition:  See Physician within 24 hours  Pt has an appt today (07/11/16) with Dr. Carollee Herter.

## 2016-07-11 NOTE — Progress Notes (Signed)
SNasal saline sprays. Nasal steroids per medication orders. Antihistamines per medication orders. subjective:     Madeline Schaefer is a 70 y.o. female who presents for evaluation of sinus pain. Symptoms include: clear rhinorrhea, congestion, cough, facial pain, headaches, nasal congestion, post nasal drip, purulent rhinorrhea, sinus pressure, sneezing and sore throat. Onset of symptoms was 4 days ago. Symptoms have been gradually worsening since that time. Past history is significant for no history of pneumonia or bronchitis. Patient is a non-smoker.  She has been taking dayquil / nyquil and mucinex with some relief.   The following portions of the patient's history were reviewed and updated as appropriate: She  has a past medical history of Blood transfusion without reported diagnosis; BRONCHITIS; CAD (coronary artery disease); Cataract; DYSPNEA; HLD (hyperlipidemia); Hypothyroidism; Mobitz type 2 second degree heart block; and PACEMAKER, PERMANENT. She  does not have any pertinent problems on file. She  has a past surgical history that includes Pacemaker insertion (2004, 12/22/11); Total abdominal hysterectomy w/ bilateral salpingoophorectomy; Myomectomy; Cardiac catheterization; Cystoscopy with retrograde pyelogram, ureteroscopy and stent placement (08/19/2012); and pacemaker generator change (N/A, 12/22/2011). Her family history includes Heart attack in her mother; Prostate cancer in her father. She  reports that she has never smoked. She has never used smokeless tobacco. She reports that she drinks alcohol. Her drug history is not on file. She has a current medication list which includes the following prescription(s): aspirin ec, atorvastatin, vitamin d3, clobetasol ointment, epinephrine, fluocinonide ointment, fluticasone, levocetirizine, levothyroxine, lisinopril, and fish oil. Current Outpatient Prescriptions on File Prior to Visit  Medication Sig Dispense Refill  . aspirin EC 81 MG tablet Take 81  mg by mouth daily.    Marland Kitchen atorvastatin (LIPITOR) 40 MG tablet Take 1 tablet (40 mg total) by mouth daily. 90 tablet 1  . Cholecalciferol (VITAMIN D3) 1000 UNITS CAPS Take 2 capsules by mouth daily.    . clobetasol ointment (TEMOVATE) AB-123456789 % Apply 1 application topically daily as needed (eczema).     . EPINEPHrine 0.3 mg/0.3 mL IJ SOAJ injection Inject 0.3 mLs (0.3 mg total) into the muscle once. 2 Device 1  . fluocinonide ointment (LIDEX) AB-123456789 % Apply 1 application topically as directed.     Marland Kitchen levothyroxine (SYNTHROID, LEVOTHROID) 88 MCG tablet TAKE ONE TABLET BY MOUTH ONCE DAILY BEFORE  BREAKFAST 90 tablet 1  . lisinopril (PRINIVIL,ZESTRIL) 10 MG tablet Take 1 tablet (10 mg total) by mouth daily. 90 tablet 1  . Omega-3 Fatty Acids (FISH OIL) 1000 MG CAPS Take 3 capsules by mouth daily.     No current facility-administered medications on file prior to visit.    She is allergic to bee venom; codeine; pneumovax 23 [pneumococcal vac polyvalent]; hornet venom; and sulfonamide derivatives..  Review of Systems Pertinent items are noted in HPI.   Objective:    BP 132/76 (BP Location: Left Arm, Cuff Size: Normal)   Pulse 74   Temp 97.9 F (36.6 C) (Oral)   Resp 16   Ht 5\' 10"  (1.778 m)   Wt 171 lb (77.6 kg)   LMP 10/17/1986   SpO2 98%   BMI 24.54 kg/m   General appearance: alert, cooperative, appears stated age and no distress Ears: normal TM's and external ear canals both ears Nose: Nares normal. Septum midline. Mucosa normal. No drainage or sinus tenderness. Throat: lips, mucosa, and tongue normal; teeth and gums normal Neck: no adenopathy, no carotid bruit, no JVD, supple, symmetrical, trachea midline and thyroid not enlarged, symmetric, no tenderness/mass/nodules  Lungs: clear to auscultation bilaterally Heart: S1, S2 normal    Assessment:    Acute viral sinusitis.    Plan:   call or rto prn Nasal saline sprays. Nasal steroids per medication orders. Antihistamines per  medication orders. call or rto prn

## 2016-07-11 NOTE — Progress Notes (Signed)
Pre visit review using our clinic review tool, if applicable. No additional management support is needed unless otherwise documented below in the visit note. 

## 2016-07-11 NOTE — Patient Instructions (Signed)
Upper Respiratory Infection, Adult Most upper respiratory infections (URIs) are caused by a virus. A URI affects the nose, throat, and upper air passages. The most common type of URI is often called "the common cold." Follow these instructions at home:  Take medicines only as told by your doctor.  Gargle warm saltwater or take cough drops to comfort your throat as told by your doctor.  Use a warm mist humidifier or inhale steam from a shower to increase air moisture. This may make it easier to breathe.  Drink enough fluid to keep your pee (urine) clear or pale yellow.  Eat soups and other clear broths.  Have a healthy diet.  Rest as needed.  Go back to work when your fever is gone or your doctor says it is okay.  You may need to stay home longer to avoid giving your URI to others.  You can also wear a face mask and wash your hands often to prevent spread of the virus.  Use your inhaler more if you have asthma.  Do not use any tobacco products, including cigarettes, chewing tobacco, or electronic cigarettes. If you need help quitting, ask your doctor. Contact a doctor if:  You are getting worse, not better.  Your symptoms are not helped by medicine.  You have chills.  You are getting more short of breath.  You have brown or red mucus.  You have yellow or brown discharge from your nose.  You have pain in your face, especially when you bend forward.  You have a fever.  You have puffy (swollen) neck glands.  You have pain while swallowing.  You have white areas in the back of your throat. Get help right away if:  You have very bad or constant:  Headache.  Ear pain.  Pain in your forehead, behind your eyes, and over your cheekbones (sinus pain).  Chest pain.  You have long-lasting (chronic) lung disease and any of the following:  Wheezing.  Long-lasting cough.  Coughing up blood.  A change in your usual mucus.  You have a stiff neck.  You have  changes in your:  Vision.  Hearing.  Thinking.  Mood. This information is not intended to replace advice given to you by your health care provider. Make sure you discuss any questions you have with your health care provider. Document Released: 12/20/2007 Document Revised: 03/05/2016 Document Reviewed: 10/08/2013 Elsevier Interactive Patient Education  2017 Elsevier Inc.  

## 2016-07-12 ENCOUNTER — Telehealth: Payer: Self-pay | Admitting: Family

## 2016-07-12 MED ORDER — BENZONATATE 200 MG PO CAPS: 200.0000 mg | ORAL_CAPSULE | Freq: Three times a day (TID) | ORAL | 0 refills | Status: DC | PRN

## 2016-07-12 NOTE — Telephone Encounter (Signed)
Received verbal order for Tessalon capsules. Informed patient that Rx was sent to the preferred pharmacy.

## 2016-07-12 NOTE — Telephone Encounter (Signed)
Relation to [id number] Call back number: [phone] Pharmacy: [facility], [place] [phone] ([phone] (Fax)    Reason for call:  Patient was seen 07/11/16 by Dr. Etter Sjogren and forgot to request benzonatate (TESSALON) 200 MG capsule which she states was previously prescribed by [facility] for a cough, patient requesting Rx, please advise

## 2016-07-28 ENCOUNTER — Ambulatory Visit: Payer: Medicare Other | Admitting: Family

## 2016-08-21 ENCOUNTER — Ambulatory Visit: Payer: Medicare Other | Admitting: Family

## 2016-08-26 ENCOUNTER — Other Ambulatory Visit: Payer: Self-pay | Admitting: Family

## 2016-08-28 ENCOUNTER — Other Ambulatory Visit: Payer: Self-pay | Admitting: Family

## 2016-08-28 ENCOUNTER — Ambulatory Visit: Payer: Medicare Other | Admitting: Family

## 2016-08-28 ENCOUNTER — Telehealth: Payer: Self-pay | Admitting: Cardiology

## 2016-08-28 ENCOUNTER — Ambulatory Visit (INDEPENDENT_AMBULATORY_CARE_PROVIDER_SITE_OTHER): Payer: Medicare Other | Admitting: *Deleted

## 2016-08-28 DIAGNOSIS — I441 Atrioventricular block, second degree: Secondary | ICD-10-CM | POA: Diagnosis not present

## 2016-08-28 NOTE — Telephone Encounter (Signed)
LMOVM reminding pt to send remote transmission.   

## 2016-08-28 NOTE — Telephone Encounter (Signed)
°  Relation to [id number] Call back number: [phone] Pharmacy:[facility]  Reason for call: pt states her appt was cancelled, and she is needing her rx  levothyroxine (SYNTHROID, LEVOTHROID) 88 MCG tablet, pt states she only has 4 tabs left and she is not going to be able to reschedule her appt until the next 2 wks and she will be out of the meds.

## 2016-08-28 NOTE — Telephone Encounter (Signed)
Rx was sent this morning Pt. Notified, Pt declined to  reschedule will call back in a week to schedule appointment

## 2016-08-29 ENCOUNTER — Encounter: Payer: Self-pay | Admitting: Cardiology

## 2016-08-29 LAB — CUP PACEART REMOTE DEVICE CHECK
Battery Impedance: 305 Ohm
Brady Statistic AP VP Percent: 2 %
Brady Statistic AP VS Percent: 0 %
Brady Statistic AS VS Percent: 0 %
Implantable Lead Implant Date: 20040907
Implantable Lead Location: 753860
Implantable Lead Model: 5076
Lead Channel Impedance Value: 496 Ohm
Lead Channel Impedance Value: 590 Ohm
Lead Channel Pacing Threshold Pulse Width: 0.4 ms
Lead Channel Sensing Intrinsic Amplitude: 1 mV
Lead Channel Setting Pacing Amplitude: 2 V
MDC IDC LEAD IMPLANT DT: 20040907
MDC IDC LEAD LOCATION: 753859
MDC IDC MSMT BATTERY REMAINING LONGEVITY: 102 mo
MDC IDC MSMT BATTERY VOLTAGE: 2.78 V
MDC IDC MSMT LEADCHNL RA PACING THRESHOLD AMPLITUDE: 0.5 V
MDC IDC MSMT LEADCHNL RV PACING THRESHOLD AMPLITUDE: 0.75 V
MDC IDC MSMT LEADCHNL RV PACING THRESHOLD PULSEWIDTH: 0.4 ms
MDC IDC PG IMPLANT DT: 20130607
MDC IDC SESS DTM: 20180212195024
MDC IDC SET LEADCHNL RV PACING AMPLITUDE: 2.5 V
MDC IDC SET LEADCHNL RV PACING PULSEWIDTH: 0.4 ms
MDC IDC SET LEADCHNL RV SENSING SENSITIVITY: 2 mV
MDC IDC STAT BRADY AS VP PERCENT: 97 %

## 2016-08-29 NOTE — Progress Notes (Signed)
Remote pacemaker transmission.   

## 2016-09-11 ENCOUNTER — Encounter: Payer: Self-pay | Admitting: Family

## 2016-09-12 ENCOUNTER — Encounter: Payer: Self-pay | Admitting: Cardiology

## 2016-09-13 ENCOUNTER — Telehealth: Payer: Self-pay | Admitting: *Deleted

## 2016-09-13 NOTE — Telephone Encounter (Signed)
LM for patient to return call to schedule AWV.   

## 2016-09-14 ENCOUNTER — Encounter: Payer: Self-pay | Admitting: Nurse Practitioner

## 2016-09-15 ENCOUNTER — Ambulatory Visit (INDEPENDENT_AMBULATORY_CARE_PROVIDER_SITE_OTHER): Payer: Medicare Other | Admitting: Family

## 2016-09-15 ENCOUNTER — Encounter: Payer: Self-pay | Admitting: Family

## 2016-09-15 VITALS — BP 133/69 | HR 66 | Temp 98.4°F | Resp 16 | Ht 69.0 in | Wt 173.6 lb

## 2016-09-15 DIAGNOSIS — E559 Vitamin D deficiency, unspecified: Secondary | ICD-10-CM

## 2016-09-15 DIAGNOSIS — E785 Hyperlipidemia, unspecified: Secondary | ICD-10-CM

## 2016-09-15 DIAGNOSIS — I10 Essential (primary) hypertension: Secondary | ICD-10-CM

## 2016-09-15 DIAGNOSIS — M8580 Other specified disorders of bone density and structure, unspecified site: Secondary | ICD-10-CM

## 2016-09-15 DIAGNOSIS — Z0000 Encounter for general adult medical examination without abnormal findings: Secondary | ICD-10-CM

## 2016-09-15 DIAGNOSIS — E039 Hypothyroidism, unspecified: Secondary | ICD-10-CM | POA: Diagnosis not present

## 2016-09-15 LAB — COMPREHENSIVE METABOLIC PANEL
ALK PHOS: 58 U/L (ref 39–117)
ALT: 14 U/L (ref 0–35)
AST: 17 U/L (ref 0–37)
Albumin: 4.3 g/dL (ref 3.5–5.2)
BUN: 19 mg/dL (ref 6–23)
CHLORIDE: 102 meq/L (ref 96–112)
CO2: 28 meq/L (ref 19–32)
Calcium: 9.2 mg/dL (ref 8.4–10.5)
Creatinine, Ser: 0.75 mg/dL (ref 0.40–1.20)
GFR: 81.16 mL/min (ref >60.00)
GLUCOSE: 102 mg/dL — AB (ref 70–99)
POTASSIUM: 4.1 meq/L (ref 3.5–5.1)
SODIUM: 137 meq/L (ref 135–145)
Total Bilirubin: 0.7 mg/dL (ref 0.2–1.2)
Total Protein: 7.4 g/dL (ref 6.0–8.3)

## 2016-09-15 LAB — LIPID PANEL
CHOL/HDL RATIO: 4
Cholesterol: 182 mg/dL (ref 0–200)
HDL: 51.3 mg/dL (ref >39.00)
LDL Cholesterol: 107 mg/dL — ABNORMAL HIGH (ref 0–99)
NONHDL: 130.79
Triglycerides: 119 mg/dL (ref 0.0–149.0)
VLDL: 23.8 mg/dL (ref 0.0–40.0)

## 2016-09-15 LAB — TSH: TSH: 1.08 u[IU]/mL (ref 0.35–4.50)

## 2016-09-15 NOTE — Progress Notes (Signed)
Subjective:   Madeline Schaefer is a 71 y.o. female who presents for Medicare Annual (Subsequent) preventive examination.  Review of Systems:  No ROS.  Medicare Wellness Visit.  Cardiac Risk Factors include: advanced age (>63men, >48 women);dyslipidemia;hypertension Sleep patterns: Sleeps 6-7 hrs. Feels rested. Home Safety/Smoke Alarms: Feels safe in home. Smoke alarms in place.   Living environment; residence and Firearm Safety: Lives at home with husband. Guns safely stored. Seat Belt Safety/Bike Helmet: Wears seat belt.   Counseling:   Eye Exam- Wears glasses for reading. Eye exams annually. Dental- Dr.Samuels every 6 months.  Female:   Pap- Hysterectomy.      Mammo-last 08/24/16: BI-RADS Category 1: Negative. Per pt. Dexa scan- Last 10/28/14: Osteopenia.     CCS- Last 10/15/13: normal     Objective:     Vitals: BP 133/69 (BP Location: Right Arm, Patient Position: Sitting, Cuff Size: Normal)   Pulse 66   Temp 98.4 F (36.9 C) (Oral)   Resp 16   Ht 5\' 9"  (1.753 m)   Wt 173 lb 9.6 oz (78.7 kg)   LMP 10/17/1986   SpO2 100%   BMI 25.64 kg/m   Body mass index is 25.64 kg/m.   Tobacco History  Smoking Status  . Never Smoker  Smokeless Tobacco  . Never Used     Counseling given: Not Answered   Past Medical History:  Diagnosis Date  . Blood transfusion without reported diagnosis   . BRONCHITIS   . CAD (coronary artery disease)    LAD stent 2003 ([facility]).    . Cataract   . DYSPNEA   . HLD (hyperlipidemia)   . Hypothyroidism   . Mobitz type 2 second degree heart block    s/p PPM by Dr Olevia Perches 2004  . PACEMAKER, PERMANENT    Past Surgical History:  Procedure Laterality Date  . CARDIAC CATHETERIZATION    . CYSTOSCOPY WITH RETROGRADE PYELOGRAM, URETEROSCOPY AND STENT PLACEMENT  08/19/2012   Procedure: CYSTOSCOPY WITH RETROGRADE PYELOGRAM, URETEROSCOPY AND STENT PLACEMENT;  Surgeon: Molli Hazard, MD;  Location[facility];  Service: Urology;   Laterality: Right;  . MYOMECTOMY    . PACEMAKER GENERATOR CHANGE N/A 12/22/2011   Procedure: PACEMAKER GENERATOR CHANGE;  Surgeon: Thompson Grayer, MD;  Location: [facility] CATH LAB;  Service: Cardiovascular;  Laterality: N/A;  . PACEMAKER INSERTION  2004, 12/22/11   initial implant by Dr Olevia Perches, Generator change (MDT Adapta L) by Dr Rayann Heman 6/13  . TOTAL ABDOMINAL HYSTERECTOMY W/ BILATERAL SALPINGOOPHORECTOMY     Family History  Problem Relation Age of Onset  . Heart attack Mother     died at age 62  . Breast cancer Mother     age 62, had mastectomy.   . Prostate cancer Father    History  Sexual Activity  . Sexual activity: Yes    Outpatient Encounter Prescriptions as of 09/15/2016  Medication Sig  . aspirin EC 81 MG tablet Take 81 mg by mouth daily.  Marland Kitchen atorvastatin (LIPITOR) 40 MG tablet Take 1 tablet (40 mg total) by mouth daily.  . benzonatate (TESSALON) 200 MG capsule Take 1 capsule (200 mg total) by mouth 3 (three) times daily as needed for cough.  . Cholecalciferol (VITAMIN D3) 1000 UNITS CAPS Take 2 capsules by mouth daily.  . clobetasol ointment (TEMOVATE) AB-123456789 % Apply 1 application topically daily as needed (eczema).   . EPINEPHrine 0.3 mg/0.3 mL IJ SOAJ injection Inject 0.3 mLs (0.3 mg total) into the muscle once.  Marland Kitchen  fluocinonide ointment (LIDEX) AB-123456789 % Apply 1 application topically as directed.   . fluticasone (FLONASE) 50 MCG/ACT nasal spray Place 2 sprays into both nostrils daily.  Marland Kitchen levocetirizine (XYZAL) 5 MG tablet Take 1 tablet (5 mg total) by mouth every evening.  Marland Kitchen levothyroxine (SYNTHROID, LEVOTHROID) 88 MCG tablet TAKE ONE TABLET BY MOUTH ONCE DAILY BEFORE BREAKFAST  . lisinopril (PRINIVIL,ZESTRIL) 10 MG tablet Take 1 tablet (10 mg total) by mouth daily.  . Omega-3 Fatty Acids (FISH OIL) 1000 MG CAPS Take 3 capsules by mouth daily.   No facility-administered encounter medications on file as of 09/15/2016.     Activities of Daily Living In your present state of health, do  you have any difficulty performing the following activities: 09/15/2016  Hearing? N  Vision? N  Difficulty concentrating or making decisions? N  Walking or climbing stairs? N  Dressing or bathing? N  Doing errands, shopping? N  Preparing Food and eating ? N  Using the Toilet? N  In the past six months, have you accidently leaked urine? N  Do you have problems with loss of bowel control? N  Managing your Medications? N  Managing your Finances? N  Housekeeping or managing your Housekeeping? N  Some recent data might be hidden    Patient Care Team: [name], NP as PCP - General (Internal Medicine) Gatha Mayer, MD as Consulting Physician (Gastroenterology) Minus Breeding, MD as Consulting Physician (Cardiology)    Assessment:    Physical assessment deferred to PCP. Exercise Activities and Dietary recommendations Current Exercise Habits: Structured exercise class, Type of exercise: treadmill;yoga;stretching, Time (Minutes): 30, Frequency (Times/Week): 5, Weekly Exercise (Minutes/Week): 150, Intensity: Moderate   Diet (meal preparation, eat out, water intake, caffeinated beverages, dairy products, fruits and vegetables): in general, a "healthy" diet     Goals    . Continue traveling      Fall Risk Fall Risk  09/15/2016 07/11/2016 04/28/2016 11/24/2014 11/23/2014  Falls in the past year? No No No Yes Yes  Number falls in past yr: - - - - 1  Injury with Fall? - - - - Yes  Risk for fall due to : - - - Other (Comment) -   Depression Screen PHQ 2/9 Scores 09/15/2016 07/11/2016 04/28/2016 11/24/2014  PHQ - 2 Score 0 0 0 0     Cognitive Function     Ad8 score reviewed for issues:  Issues making decisions: no   Less interest in hobbies / activities:no  Repeats questions, stories (family complaining):no  Trouble using ordinary gadgets (microwave, computer, phone):no  Forgets the month or year: no  Mismanaging finances:no   Remembering appts:no  Daily problems  with thinking and/or memory:no Ad8 score is=0     Immunization History  Administered Date(s) Administered  . Influenza Whole 06/01/2008, 05/06/2012  . Influenza, High Dose Seasonal PF 05/12/2015, 04/28/2016  . Influenza-Unspecified 05/17/2013  . Pneumococcal Conjugate-13 11/23/2014  . Pneumococcal Polysaccharide-23 05/04/2008, 04/28/2016  . Td 10/06/2003, 11/14/2005  . Tdap 05/02/2011  . Zoster 07/16/2007   Screening Tests Health Maintenance  Topic Date Due  . MAMMOGRAM  08/24/2017  . TETANUS/TDAP  05/01/2021  . COLONOSCOPY  10/16/2023  . INFLUENZA VACCINE  Completed  . DEXA SCAN  Completed  . Hepatitis C Screening  Completed  . PNA vac Low Risk Adult  Completed      Plan:     Continue to eat heart healthy diet (full of fruits, vegetables, whole grains, lean protein, water--limit salt, fat, and sugar intake)  and increase physical activity as tolerated.  Continue doing brain stimulating activities (puzzles, reading, adult coloring books, staying active) to keep memory sharp.   Bring a copy of your advance directives to your next office visit.  During the course of the visit the patient was educated and counseled about the following appropriate screening and preventive services:   Vaccines to include Pneumoccal, Influenza, Hepatitis B, Td, Zostavax, HCV  Cardiovascular Disease  Colorectal cancer screening  Bone density screening  Diabetes screening  Glaucoma screening  Mammography/PAP  Nutrition counseling   Patient Instructions (the written plan) was given to the patient.   Shela Nevin, [place]  09/15/2016

## 2016-09-15 NOTE — Progress Notes (Signed)
Subjective:    Patient ID: Madeline Schaefer, female    DOB: May 10, 1946, 71 y.o.   MRN: [id number]  HPI  Patient presents today for complete physical.  Immunizations: tetanus is up to date.  Zoster up to date.  Flu shot up to date, prevnar/pneumovax up to date.  Diet: healthy Exercise: yes, [facility] Colonoscopy:  Thinks colo <10 years ago.   Dexa: 2014- osteopenia.  Pap Smear: hysterectomy Mammogram:  2/18- normal at [facility].      Review of Systems  Constitutional: Negative for unexpected weight change.  HENT: Negative for hearing loss and rhinorrhea.   Eyes: Negative for visual disturbance.  Respiratory: Negative for cough.   Cardiovascular: Negative for leg swelling.  Gastrointestinal: Negative for blood in stool, constipation and diarrhea.  Genitourinary: Negative for dysuria, frequency and hematuria.  Musculoskeletal: Negative for arthralgias and myalgias.  Neurological: Negative for headaches.  Hematological: Negative for adenopathy.  Psychiatric/Behavioral:       Denies depression/anxiety   Past Medical History:  Diagnosis Date  . Blood transfusion without reported diagnosis   . BRONCHITIS   . CAD (coronary artery disease)    LAD stent 2003 ([facility]).    . Cataract   . DYSPNEA   . HLD (hyperlipidemia)   . Hypothyroidism   . Mobitz type 2 second degree heart block    s/p PPM by Dr Olevia Perches 2004  . PACEMAKER, PERMANENT      Social History   Social History  . Marital status: Married    Spouse name: N/A  . Number of children: 0  . Years of education: N/A   Occupational History  . Not on file.   Social History Main Topics  . Smoking status: Never Smoker  . Smokeless tobacco: Never Used  . Alcohol use Yes     Comment: 1 drink daily  . Drug use: Unknown  . Sexual activity: Yes   Other Topics Concern  . 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    Past Surgical History:  Procedure  Laterality Date  . CARDIAC CATHETERIZATION    . CYSTOSCOPY WITH RETROGRADE PYELOGRAM, URETEROSCOPY AND STENT PLACEMENT  08/19/2012   Procedure: CYSTOSCOPY WITH RETROGRADE PYELOGRAM, URETEROSCOPY AND STENT PLACEMENT;  Surgeon: Molli Hazard, MD;  Location: [facility];  Service: Urology;  Laterality: Right;  . MYOMECTOMY    . PACEMAKER GENERATOR CHANGE N/A 12/22/2011   Procedure: PACEMAKER GENERATOR CHANGE;  Surgeon: Thompson Grayer, MD;  Location: [facility] CATH LAB;  Service: Cardiovascular;  Laterality: N/A;  . PACEMAKER INSERTION  2004, 12/22/11   initial implant by Dr Olevia Perches, Generator change (MDT Adapta L) by Dr Rayann Heman 6/13  . TOTAL ABDOMINAL HYSTERECTOMY W/ BILATERAL SALPINGOOPHORECTOMY      Family History  Problem Relation Age of Onset  . Heart attack Mother     died at age 77  . Breast cancer Mother     age 70, had mastectomy.   . Prostate cancer Father     Allergies  Allergen Reactions  . Bee Venom Anaphylaxis  . Codeine Other (See Comments)    REACTION: Passed  out, facial swelling and hives  . Pneumovax 23 [Pneumococcal Vac Polyvalent] Swelling    Local swelling and pain  . Hornet Venom Hives    Hives over entire body  . Sulfonamide Derivatives Hives    Current Outpatient Prescriptions on File Prior to Visit  Medication Sig  Dispense Refill  . aspirin EC 81 MG tablet Take 81 mg by mouth daily.    Marland Kitchen atorvastatin (LIPITOR) 40 MG tablet Take 1 tablet (40 mg total) by mouth daily. 90 tablet 1  . benzonatate (TESSALON) 200 MG capsule Take 1 capsule (200 mg total) by mouth 3 (three) times daily as needed for cough. 20 capsule 0  . Cholecalciferol (VITAMIN D3) 1000 UNITS CAPS Take 2 capsules by mouth daily.    . clobetasol ointment (TEMOVATE) AB-123456789 % Apply 1 application topically daily as needed (eczema).     . EPINEPHrine 0.3 mg/0.3 mL IJ SOAJ injection Inject 0.3 mLs (0.3 mg total) into the muscle once. 2 Device 1  . fluocinonide ointment (LIDEX) AB-123456789 % Apply 1  application topically as directed.     . fluticasone (FLONASE) 50 MCG/ACT nasal spray Place 2 sprays into both nostrils daily. 16 g 6  . levocetirizine (XYZAL) 5 MG tablet Take 1 tablet (5 mg total) by mouth every evening. 30 tablet 5  . levothyroxine (SYNTHROID, LEVOTHROID) 88 MCG tablet TAKE ONE TABLET BY MOUTH ONCE DAILY BEFORE BREAKFAST 90 tablet 0  . lisinopril (PRINIVIL,ZESTRIL) 10 MG tablet Take 1 tablet (10 mg total) by mouth daily. 90 tablet 1  . Omega-3 Fatty Acids (FISH OIL) 1000 MG CAPS Take 3 capsules by mouth daily.     No current facility-administered medications on file prior to visit.     BP 133/69 (BP Location: Right Arm, Patient Position: Sitting, Cuff Size: Normal)   Pulse 66   Temp 98.4 F (36.9 C) (Oral)   Resp 16   Ht 5\' 9"  (1.753 m)   Wt 173 lb 9.6 oz (78.7 kg)   LMP 10/17/1986   SpO2 100%   BMI 25.64 kg/m       Objective:   Physical Exam  Physical Exam  Constitutional: She is oriented to person, place, and time. She appears well-developed and well-nourished. No distress.  HENT:  Head: Normocephalic and atraumatic.  Right Ear: Tympanic membrane and ear canal normal.  Left Ear: Tympanic membrane and ear canal normal.  Mouth/Throat: Oropharynx is clear and moist.  Eyes: Pupils are equal, round, and reactive to light. No scleral icterus.  Neck: Normal range of motion. No thyromegaly present.  Cardiovascular: Normal rate and regular rhythm.   No murmur heard. Pulmonary/Chest: Effort normal and breath sounds normal. No respiratory distress. He has no wheezes. She has no rales. She exhibits no tenderness.  Abdominal: Soft. Bowel sounds are normal. She exhibits no distension and no mass. There is no tenderness. There is no rebound and no guarding.  Musculoskeletal: She exhibits no edema.  Lymphadenopathy:    She has no cervical adenopathy.  Neurological: She is alert and oriented to person, place, and time. She has normal patellar reflexes. She exhibits  normal muscle tone. Coordination normal.  Skin: Skin is warm and dry.  Psychiatric: She has a normal mood and affect. Her behavior is normal. Judgment and thought content normal.            Assessment & Plan:         Assessment & Plan:

## 2016-09-15 NOTE — Assessment & Plan Note (Signed)
Continue healthy diet, exercise.  Obtain follow up lab work. Immunizations reviewed and up to date. Will refer for dexa scan.

## 2016-09-15 NOTE — Progress Notes (Signed)
Reviewed and agree.

## 2016-09-15 NOTE — Progress Notes (Signed)
Pre visit review using our clinic review tool, if applicable. No additional management support is needed unless otherwise documented below in the visit note. 

## 2016-09-15 NOTE — Patient Instructions (Addendum)
Please complete lab work prior to leaving. You will be contacted about scheduling your bone density.  Continue healthy diet and exercise.   Continue to eat heart healthy diet (full of fruits, vegetables, whole grains, lean protein, water--limit salt, fat, and sugar intake) and increase physical activity as tolerated.  Continue doing brain stimulating activities (puzzles, reading, adult coloring books, staying active) to keep memory sharp.   Bring a copy of your advance directives to your next office visit.

## 2016-09-18 LAB — VITAMIN D 1,25 DIHYDROXY
VITAMIN D3 1, 25 (OH): 48 pg/mL
Vitamin D 1, 25 (OH)2 Total: 48 pg/mL (ref 18–72)
Vitamin D2 1, 25 (OH)2: <8 pg/mL

## 2016-09-22 ENCOUNTER — Other Ambulatory Visit: Payer: Self-pay | Admitting: Family

## 2016-10-05 LAB — HM DEXA SCAN

## 2016-10-10 ENCOUNTER — Telehealth: Payer: Self-pay | Admitting: Family

## 2016-10-10 NOTE — Telephone Encounter (Signed)
Caller name: Relationship to patient: Self Can be reached: [phone]  Pharmacy:  Reason for call: Patient request call back to see if there are any test results she has not gotten. States she can not get onto My Chart and had a Bone Density test done. Plse adv

## 2016-10-10 NOTE — Telephone Encounter (Signed)
Report has been abstracted and place in PCP's yellow folder for review.

## 2016-10-11 ENCOUNTER — Telehealth: Payer: Self-pay | Admitting: Family

## 2016-10-11 NOTE — Telephone Encounter (Signed)
Tried to reach pt left message to call back.  

## 2016-10-11 NOTE — Telephone Encounter (Signed)
Spoke with pt about results and continue to Caltrate ,vitamin D and regular weight bearing exercise. Pt voiced understanding.

## 2016-10-11 NOTE — Telephone Encounter (Signed)
Error

## 2016-10-11 NOTE — Telephone Encounter (Signed)
Bone density was reviewed. Shows some bone thinning, osteopenia. Continue caltrate, vitamin D and regular weight bearing exercise.

## 2016-10-11 NOTE — Telephone Encounter (Signed)
Opened in error

## 2016-10-11 NOTE — Telephone Encounter (Signed)
Bone density results are not back yet. I will mail her a copy when I get them.

## 2016-11-13 NOTE — Progress Notes (Signed)
Electrophysiology Office Note Date: 11/14/2016  ID:  Madeline Schaefer, DOB [birth year], MRN [id number]  PCP: Madeline Pear., NP Primary Cardiologist: Madeline Schaefer Electrophysiologist: Madeline Schaefer  CC: Pacemaker follow-up n  Madeline Schaefer is a 71 y.o. female seen today for Madeline Schaefer.  She presents today for routine electrophysiology followup.  Since last being seen in our clinic, the patient reports doing very well.  She is planning another trip out [place] this summer.  She denies chest pain, palpitations, dyspnea, PND, orthopnea, nausea, vomiting, dizziness, syncope, edema, weight gain, or early satiety.  Device History: MDT dual chamber PPM implanted 2004 for Mobitz II; gen change 2013   Past Medical History:  Diagnosis Date  . Blood transfusion without reported diagnosis   . BRONCHITIS   . CAD (coronary artery disease)    LAD stent 2003 ([facility]).    . Cataract   . DYSPNEA   . HLD (hyperlipidemia)   . Hypothyroidism   . Mobitz type 2 second degree heart block    s/p PPM by Madeline Schaefer 2004  . PACEMAKER, PERMANENT    Past Surgical History:  Procedure Laterality Date  . CARDIAC CATHETERIZATION    . CYSTOSCOPY WITH RETROGRADE PYELOGRAM, URETEROSCOPY AND STENT PLACEMENT  08/19/2012   Procedure: CYSTOSCOPY WITH RETROGRADE PYELOGRAM, URETEROSCOPY AND STENT PLACEMENT;  Surgeon: Madeline Hazard, MD;  Location: [facility];  Service: Urology;  Laterality: Right;  . MYOMECTOMY    . PACEMAKER GENERATOR CHANGE N/A 12/22/2011   Procedure: PACEMAKER GENERATOR CHANGE;  Surgeon: Madeline Grayer, MD;  Location: [facility] CATH LAB;  Service: Cardiovascular;  Laterality: N/A;  . PACEMAKER INSERTION  2004, 12/22/11   initial implant by Madeline Schaefer, Generator change (MDT Adapta L) by Madeline Schaefer  . TOTAL ABDOMINAL HYSTERECTOMY W/ BILATERAL SALPINGOOPHORECTOMY      Current Outpatient Prescriptions  Medication Sig Dispense Refill  . aspirin EC 81 MG tablet Take 81 mg by mouth daily.    Marland Kitchen  atorvastatin (LIPITOR) 40 MG tablet TAKE ONE TABLET BY MOUTH ONCE DAILY 90 tablet 1  . benzonatate (TESSALON) 200 MG capsule Take 1 capsule (200 mg total) by mouth 3 (three) times daily as needed for cough. 20 capsule 0  . Cholecalciferol (VITAMIN D3) 1000 UNITS CAPS Take 2 capsules by mouth daily.    . clobetasol ointment (TEMOVATE) 8.36 % Apply 1 application topically daily as needed (eczema).     . EPINEPHrine 0.3 mg/0.3 mL IJ SOAJ injection Inject 0.3 mLs (0.3 mg total) into the muscle once. 2 Device 1  . fluocinonide ointment (LIDEX) 6.29 % Apply 1 application topically as directed.     . fluticasone (FLONASE) 50 MCG/ACT nasal spray Place 2 sprays into both nostrils daily. 16 g 6  . levocetirizine (XYZAL) 5 MG tablet Take 1 tablet (5 mg total) by mouth every evening. 30 tablet 5  . levothyroxine (SYNTHROID, LEVOTHROID) 88 MCG tablet TAKE ONE TABLET BY MOUTH ONCE DAILY BEFORE BREAKFAST 90 tablet 0  . lisinopril (PRINIVIL,ZESTRIL) 10 MG tablet Take 1 tablet (10 mg total) by mouth daily. 90 tablet 1  . Omega-3 Fatty Acids (FISH OIL) 1000 MG CAPS Take 3 capsules by mouth daily.     No current facility-administered medications for this visit.     Allergies:   Bee venom; Codeine; Pneumovax 23 [pneumococcal vac polyvalent]; Hornet venom; and Sulfonamide derivatives   Social History: Social History   Social History  . Marital status: Married    Spouse name: N/A  . Number  of children: 0  . Years of education: N/A   Occupational History  . Not on file.   Social History Main Topics  . Smoking status: Never Smoker  . Smokeless tobacco: Never Used  . Alcohol use Yes     Comment: 1 drink daily  . Drug use: Unknown  . Sexual activity: Yes   Other Topics Concern  . Not on file   Social History Narrative   Retired Furniture conservator/restorer   Enjoys RV, bikes   No children   No pets       Family History: Family History  Problem Relation Age of Onset  . Heart attack Mother     died  at age 39  . Breast cancer Mother     age 13, had mastectomy.   . Prostate cancer Father      Review of Systems: All other systems reviewed and are otherwise negative except as noted above.   Physical Exam: VS:  BP 120/70   Pulse 74   Ht 5\' 10"  (1.778 m)   Wt 170 lb (77.1 kg)   LMP 10/17/1986   SpO2 98%   BMI 24.39 kg/m  , BMI Body mass index is 24.39 kg/m.  GEN- The patient is well appearing, alert and oriented x 3 today.   HEENT: normocephalic, atraumatic; sclera clear, conjunctiva pink; hearing intact; oropharynx clear; neck supple  Lungs- Clear to ausculation bilaterally, normal work of breathing.  No wheezes, rales, rhonchi Heart- Regular rate and rhythm (paced) GI- soft, non-tender, non-distended, bowel sounds present  Extremities- no clubbing, cyanosis, or edema  MS- no significant deformity or atrophy Skin- warm and dry, no rash or lesion; PPM pocket well healed Psych- euthymic mood, full affect Neuro- strength and sensation are intact  PPM Interrogation- reviewed in detail today,  See PACEART report  EKG:  EKG is not ordered today.  Recent Labs: 09/15/2016: ALT 14; BUN 19; Creatinine, Ser 0.75; Potassium 4.1; Sodium 137; TSH 1.08   Wt Readings from Last 3 Encounters:  11/14/16 170 lb (77.1 kg)  09/15/16 173 lb 9.6 oz (78.7 kg)  07/11/16 171 lb (77.6 kg)     Other studies Reviewed: Additional studies/ records that were reviewed today include: Madeline Schaefer and Madeline Madeline Schaefer's office notes  Assessment and Plan:  1.  Mobitz II Normal PPM function See Pace Art report No changes today  2.  HTN Stable No change required today  3.  CAD Stable No change required today  4.  Atrial tachycardia Burden by device interrogation very low Will continue to monitor No AF to date Asymptomatic   5. AI/MR Repeat echo scheduled for later this month Followed by Madeline Madeline Schaefer    Current medicines are reviewed at length with the patient today.   The patient does not  have concerns regarding her medicines.  The following changes were made today:  none  Labs/ tests ordered today include: none Orders Placed This Encounter  Procedures  . CUP PACEART INCLINIC DEVICE CHECK     Disposition:   Follow up with [facility], Madeline Madeline Schaefer as scheduled, Madeline Schaefer 1 year      Signed, Chanetta Marshall, NP 11/14/2016 12:25 PM  [facility] [address] [place] [address] [phone] (office) [phone] (fax)

## 2016-11-14 ENCOUNTER — Encounter: Payer: Self-pay | Admitting: Nurse Practitioner

## 2016-11-14 ENCOUNTER — Ambulatory Visit (INDEPENDENT_AMBULATORY_CARE_PROVIDER_SITE_OTHER): Payer: Medicare Other | Admitting: Nurse Practitioner

## 2016-11-14 VITALS — BP 120/70 | HR 74 | Ht 70.0 in | Wt 170.0 lb

## 2016-11-14 DIAGNOSIS — I340 Nonrheumatic mitral (valve) insufficiency: Secondary | ICD-10-CM | POA: Diagnosis not present

## 2016-11-14 DIAGNOSIS — I471 Supraventricular tachycardia: Secondary | ICD-10-CM

## 2016-11-14 DIAGNOSIS — I10 Essential (primary) hypertension: Secondary | ICD-10-CM

## 2016-11-14 DIAGNOSIS — I441 Atrioventricular block, second degree: Secondary | ICD-10-CM | POA: Diagnosis not present

## 2016-11-14 DIAGNOSIS — I2510 Atherosclerotic heart disease of native coronary artery without angina pectoris: Secondary | ICD-10-CM | POA: Diagnosis not present

## 2016-11-14 LAB — CUP PACEART INCLINIC DEVICE CHECK
Date Time Interrogation Session: 20180501121022
Implantable Lead Implant Date: 20040907
Implantable Lead Location: 753860
Implantable Lead Model: 5076
Implantable Lead Model: 5076
Implantable Pulse Generator Implant Date: 20130607
MDC IDC LEAD IMPLANT DT: 20040907
MDC IDC LEAD LOCATION: 753859

## 2016-11-14 NOTE — Patient Instructions (Signed)
Medication Instructions:  None Ordered   Labwork: None Ordered   Testing/Procedures: Remote monitoring is used to monitor your Pacemaker from home. This monitoring reduces the number of office visits required to check your device to one time per year. It allows [place] to keep an eye on the functioning of your device to ensure it is working properly. You are scheduled for a device check from home on 02/13/2017. You may send your transmission at any time that day. If you have a wireless device, the transmission will be sent automatically. After your physician reviews your transmission, you will receive a postcard with your next transmission date.    Follow-Up: Your physician wants you to follow-up in: 1 year with Dr. Rayann Heman. You will receive a reminder letter in the mail two months in advance. If you don't receive a letter, please call our office to schedule the follow-up appointment.   Any Other Special Instructions Will Be Listed Below (If Applicable).     If you need a refill on your cardiac medications before your next appointment, please call your pharmacy.

## 2016-11-15 ENCOUNTER — Encounter: Payer: Medicare Other | Admitting: Nurse Practitioner

## 2016-11-27 ENCOUNTER — Ambulatory Visit (HOSPITAL_COMMUNITY): Payer: Medicare Other | Attending: Cardiology

## 2016-11-27 ENCOUNTER — Other Ambulatory Visit: Payer: Self-pay

## 2016-11-27 DIAGNOSIS — I517 Cardiomegaly: Secondary | ICD-10-CM | POA: Insufficient documentation

## 2016-11-27 DIAGNOSIS — I253 Aneurysm of heart: Secondary | ICD-10-CM | POA: Insufficient documentation

## 2016-11-27 DIAGNOSIS — I2510 Atherosclerotic heart disease of native coronary artery without angina pectoris: Secondary | ICD-10-CM | POA: Diagnosis not present

## 2016-11-27 DIAGNOSIS — R29898 Other symptoms and signs involving the musculoskeletal system: Secondary | ICD-10-CM | POA: Insufficient documentation

## 2016-11-27 DIAGNOSIS — I361 Nonrheumatic tricuspid (valve) insufficiency: Secondary | ICD-10-CM | POA: Insufficient documentation

## 2016-11-27 DIAGNOSIS — I351 Nonrheumatic aortic (valve) insufficiency: Secondary | ICD-10-CM | POA: Insufficient documentation

## 2016-11-27 DIAGNOSIS — I340 Nonrheumatic mitral (valve) insufficiency: Secondary | ICD-10-CM | POA: Diagnosis present

## 2016-11-27 DIAGNOSIS — I371 Nonrheumatic pulmonary valve insufficiency: Secondary | ICD-10-CM | POA: Insufficient documentation

## 2016-12-03 ENCOUNTER — Other Ambulatory Visit: Payer: Self-pay | Admitting: Family

## 2016-12-04 ENCOUNTER — Telehealth: Payer: Self-pay | Admitting: Cardiology

## 2016-12-04 NOTE — Telephone Encounter (Signed)
New message ° ° ° °Patient calling for echo test results °

## 2016-12-05 NOTE — Telephone Encounter (Signed)
Leave message for pt to call back 

## 2016-12-05 NOTE — Telephone Encounter (Signed)
Follow up      Pt request to talk to Dr Hochrein's nurse.  She said she needed to follow up on a previous conversation.

## 2016-12-07 NOTE — Telephone Encounter (Signed)
Spoke with pt letting her know to keep appt for June 1st

## 2016-12-14 NOTE — Progress Notes (Signed)
HPI The patient presents for follow up of CAD.  He has a mildly reduced EF and moderate AI with echo last month.  She has a distant history of stenting to her LAD. She presents for follow up.  She has had a reduced EF since she was seen previously by Dr. Olevia Perches.  I reviewed an echo from 2011 and the EF was 40 - 45%.  She had mild AI.  Her EF in Nov of last year was 40 - 45% with moderate AI.    On the echo follow up last month her EF was slightly lower athough this might be intraobserver variability.  (I reviewed these images personally.)  AI was moderate.    Since I last saw her she has done well.  The patient denies any new symptoms such as chest discomfort, neck or arm discomfort. There has been no new shortness of breath, PND or orthopnea. There have been no reported palpitations, presyncope or syncope.  She has been dealing with her husband who is having cough and possible sinus problems.    She has not been exercising as much as I would like.  Allergies  Allergen Reactions  . Bee Venom Anaphylaxis  . Codeine Other (See Comments)    REACTION: Passed  out, facial swelling and hives  . Pneumovax 23 [Pneumococcal Vac Polyvalent] Swelling    Local swelling and pain  . Hornet Venom Hives    Hives over entire body  . Sulfonamide Derivatives Hives    Current Outpatient Prescriptions  Medication Sig Dispense Refill  . aspirin EC 81 MG tablet Take 81 mg by mouth daily.    Marland Kitchen atorvastatin (LIPITOR) 40 MG tablet TAKE ONE TABLET BY MOUTH ONCE DAILY 90 tablet 1  . benzonatate (TESSALON) 200 MG capsule Take 1 capsule (200 mg total) by mouth 3 (three) times daily as needed for cough. 20 capsule 0  . Cholecalciferol (VITAMIN D3) 1000 UNITS CAPS Take 2 capsules by mouth daily.    . clobetasol ointment (TEMOVATE) 2.72 % Apply 1 application topically daily as needed (eczema).     . EPINEPHrine 0.3 mg/0.3 mL IJ SOAJ injection Inject 0.3 mLs (0.3 mg total) into the muscle once. 2 Device 1  .  fluocinonide ointment (LIDEX) 5.36 % Apply 1 application topically as directed.     . fluticasone (FLONASE) 50 MCG/ACT nasal spray Place 2 sprays into both nostrils daily. 16 g 6  . levocetirizine (XYZAL) 5 MG tablet Take 1 tablet (5 mg total) by mouth every evening. 30 tablet 5  . levothyroxine (SYNTHROID, LEVOTHROID) 88 MCG tablet TAKE 1 TABLET BY MOUTH ONCE DAILY BEFORE  BREAKFAST 90 tablet 1  . lisinopril (PRINIVIL,ZESTRIL) 20 MG tablet Take 1 tablet (20 mg total) by mouth daily. 90 tablet 3  . Omega-3 Fatty Acids (FISH OIL) 1000 MG CAPS Take 3 capsules by mouth daily.     No current facility-administered medications for this visit.     Past Medical History:  Diagnosis Date  . Blood transfusion without reported diagnosis   . BRONCHITIS   . CAD (coronary artery disease)    LAD stent 2003 ([facility]).    . Cataract   . DYSPNEA   . HLD (hyperlipidemia)   . Hypothyroidism   . Mobitz type 2 second degree heart block    s/p PPM by Dr Olevia Perches 2004  . PACEMAKER, PERMANENT     Past Surgical History:  Procedure Laterality Date  . CARDIAC CATHETERIZATION    . CYSTOSCOPY  WITH RETROGRADE PYELOGRAM, URETEROSCOPY AND STENT PLACEMENT  08/19/2012   Procedure: CYSTOSCOPY WITH RETROGRADE PYELOGRAM, URETEROSCOPY AND STENT PLACEMENT;  Surgeon: Molli Hazard, MD;  Location: [facility];  Service: Urology;  Laterality: Right;  . MYOMECTOMY    . PACEMAKER GENERATOR CHANGE N/A 12/22/2011   Procedure: PACEMAKER GENERATOR CHANGE;  Surgeon: Thompson Grayer, MD;  Location: [facility] CATH LAB;  Service: Cardiovascular;  Laterality: N/A;  . PACEMAKER INSERTION  2004, 12/22/11   initial implant by Dr Olevia Perches, Generator change (MDT Adapta L) by Dr Rayann Heman 6/13  . TOTAL ABDOMINAL HYSTERECTOMY W/ BILATERAL SALPINGOOPHORECTOMY      ROS:   As stated in the HPI and negative for all other systems.  PHYSICAL EXAM BP 134/74   Pulse 72   Ht 5\' 9"  (1.753 m)   Wt 170 lb (77.1 kg)   LMP 10/17/1986   BMI 25.10  kg/m  BP 134/74   Pulse 72   Ht 5\' 9"  (1.753 m)   Wt 170 lb (77.1 kg)   LMP 10/17/1986   BMI 25.10 kg/m    GENERAL:  Well appearing NECK:  No jugular venous distention, waveform within normal limits, carotid upstroke brisk and symmetric, no bruits, no thyromegaly LUNGS:  Clear to auscultation bilaterally BACK:  No CVA tenderness CHEST:  Unremarkable HEART:  PMI not displaced or sustained,S1 and S2 within normal limits, no S3, no S4, no clicks, no rubs, soft brief apical systolic murmur.  No diastolic murmurs ABD:  Flat, positive bowel sounds normal in frequency in pitch, no bruits, no rebound, no guarding, no midline pulsatile mass, no hepatomegaly, no splenomegaly EXT:  2 plus pulses throughout, no edema, no cyanosis no clubbing  Lab Results  Component Value Date   CHOL 182 09/15/2016   TRIG 119.0 09/15/2016   HDL 51.30 09/15/2016   LDLCALC 107 (H) 09/15/2016    ASSESSMENT AND PLAN   CAD:     She is having no symptoms. No further testing is indicated.    STATUS POST PACEMAKER:   She is up to date with follow up with Dr. Rayann Heman.   She has had routine followup with Dr. Rayann Heman  HTN:  This is being managed in the context of treating his CHF  DYSLIPIDEMIA:    I would suggest a target LDL of 70 or lower.   I would like to increase her Lipitor to 80 mg and repeat a lipid and liver in 8 weeks.   REDUCED EF:   EF was 35% on echo last a few weeks ago.  I reviewed this and it is not clear that this is lower than the chronic mild LV dysfunction that she has had.  I will titrate her ACE inhibitor and consider adding a beta blocker at the next visit.   She remains asymptomatic.  AI/MR:  There is moderate AI.  This is in my view not changed and I don't see that this is the cause of her reduced EF.  Her LV dimensions are unchanged over time and there is not global dilatation.  She does not have severe AI and no symptoms.  I think that this can be followed clinically and with serial  echocardiogram.

## 2016-12-15 ENCOUNTER — Encounter: Payer: Self-pay | Admitting: Cardiology

## 2016-12-15 ENCOUNTER — Telehealth: Payer: Self-pay | Admitting: Cardiology

## 2016-12-15 ENCOUNTER — Telehealth: Payer: Self-pay | Admitting: *Deleted

## 2016-12-15 ENCOUNTER — Ambulatory Visit (INDEPENDENT_AMBULATORY_CARE_PROVIDER_SITE_OTHER): Payer: Medicare Other | Admitting: Cardiology

## 2016-12-15 VITALS — BP 134/74 | HR 72 | Ht 69.0 in | Wt 170.0 lb

## 2016-12-15 DIAGNOSIS — E785 Hyperlipidemia, unspecified: Secondary | ICD-10-CM | POA: Diagnosis not present

## 2016-12-15 DIAGNOSIS — I255 Ischemic cardiomyopathy: Secondary | ICD-10-CM

## 2016-12-15 DIAGNOSIS — Z79899 Other long term (current) drug therapy: Secondary | ICD-10-CM

## 2016-12-15 DIAGNOSIS — I351 Nonrheumatic aortic (valve) insufficiency: Secondary | ICD-10-CM

## 2016-12-15 MED ORDER — LISINOPRIL 20 MG PO TABS: 20.0000 mg | ORAL_TABLET | Freq: Every day | ORAL | 3 refills | Status: DC

## 2016-12-15 MED ORDER — ATORVASTATIN CALCIUM 80 MG PO TABS: 80.0000 mg | ORAL_TABLET | Freq: Every day | ORAL | 3 refills | Status: DC

## 2016-12-15 NOTE — Patient Instructions (Addendum)
Medication Instructions:  INCREASE- Lisinopril 20 mg daily  Labwork: None Ordered  Testing/Procedures: None Ordered  Follow-Up: Your physician recommends that you schedule a follow-up appointment in: 1 Month.   Any Other Special Instructions Will Be Listed Below (If Applicable).   If you need a refill on your cardiac medications before your next appointment, please call your pharmacy.

## 2016-12-15 NOTE — Telephone Encounter (Signed)
New message     Pt is following up on a call from [place]

## 2016-12-15 NOTE — Telephone Encounter (Signed)
Atorvastatin was increase to 80 mg and send to pt pharmacy, Lipid Liver ordered and mail to pt to get done in 8 weeks, message with changes left on pt voicemail.

## 2016-12-15 NOTE — Telephone Encounter (Signed)
-----   Message from Minus Breeding, MD sent at 12/15/2016  1:29 PM EDT ----- Increase Lipitor to 80 mg and repeat lipid and liver in 8 weeks.

## 2016-12-15 NOTE — Telephone Encounter (Signed)
Spoke with pt about changes made to her Atorvastatin increase to 80 mg, fasting lipid liver labs mail to pt

## 2016-12-18 ENCOUNTER — Telehealth: Payer: Self-pay | Admitting: Cardiology

## 2016-12-18 NOTE — Telephone Encounter (Signed)
New message       Talk to the nurse.  Pt would not tell me what she wanted.  She asked for Nya if possible

## 2016-12-18 NOTE — Telephone Encounter (Signed)
Patient aware of MD advice. Voiced understanding  

## 2016-12-18 NOTE — Telephone Encounter (Signed)
Returned call to patient  Patient saw Dr. Percival Spanish 6/1 She has a leaky AV - she states she was told that this leakiness has changed a little bit -- stated Dr. Percival Spanish mentioned he may speak with Dr. Roxy Manns about the timeliness of a tentative valve replacement?  Her question is:  She has several trips planned and will be traveling via RV for 2 months from [facility].  She has to send money in for this trip before July 1  Wants to know if she is OK to travel for this length of time? She will also be traveling more, after this Sept-Nov trip as well  Deferred to MD

## 2016-12-18 NOTE — Telephone Encounter (Signed)
I reviewed all of the old notes and echos after she left the office.  I really don't think it has changed much and I don't think we are looking at valve surgery soon.  I will be following up on echos and she should go ahead with her plans.  I will follow closely when she is in town.  Tell her to have a great time.

## 2017-01-03 ENCOUNTER — Encounter: Payer: Self-pay | Admitting: Cardiology

## 2017-01-05 ENCOUNTER — Ambulatory Visit (INDEPENDENT_AMBULATORY_CARE_PROVIDER_SITE_OTHER): Payer: Medicare Other

## 2017-01-05 ENCOUNTER — Encounter: Payer: Self-pay | Admitting: Podiatry

## 2017-01-05 ENCOUNTER — Ambulatory Visit (INDEPENDENT_AMBULATORY_CARE_PROVIDER_SITE_OTHER): Payer: Medicare Other | Admitting: Podiatry

## 2017-01-05 VITALS — BP 122/68 | HR 70 | Resp 16 | Ht 70.0 in | Wt 165.0 lb

## 2017-01-05 DIAGNOSIS — M2041 Other hammer toe(s) (acquired), right foot: Secondary | ICD-10-CM

## 2017-01-05 DIAGNOSIS — M79671 Pain in right foot: Secondary | ICD-10-CM | POA: Diagnosis not present

## 2017-01-05 DIAGNOSIS — M722 Plantar fascial fibromatosis: Secondary | ICD-10-CM

## 2017-01-05 MED ORDER — TRIAMCINOLONE ACETONIDE 10 MG/ML IJ SUSP
10.0000 mg | Freq: Once | INTRAMUSCULAR | Status: AC
  Administered 2017-01-05: 10 mg

## 2017-01-05 NOTE — Patient Instructions (Signed)

## 2017-01-05 NOTE — Progress Notes (Signed)
   Subjective:    Patient ID: Madeline Schaefer, female    DOB: 01-18-1946, 71 y.o.   MRN: [id number]  HPI Chief Complaint  Patient presents with  . Foot Pain    Right foot; bottom of heel; pt stated, "Feels sore; at night, it throbs"; x1 month  . Callouses    Right foot; 4th toe-lateral side; x3 months      Review of Systems  All other systems reviewed and are negative.      Objective:   Physical Exam        Assessment & Plan:

## 2017-01-08 NOTE — Progress Notes (Signed)
Subjective:    Patient ID: Madeline Schaefer, female   DOB: 71 y.o.   MRN: [id number]   HPI patient presents stating that her right heel has been very tender and it's been going on for a couple months worse over the last month and she also has a digital deformity on the fourth digit of her right foot    Review of Systems  All other systems reviewed and are negative.       Objective:  Physical Exam  Cardiovascular: Intact distal pulses.   Musculoskeletal: Normal range of motion.  Neurological: She is alert.  Skin: Skin is warm.  Nursing note and vitals reviewed.  neurovascular status intact with muscle strength adequate range of motion within normal limits with patient noted to have quite a bit of discomfort in the plantar heel right foot with inflammation fluid buildup and keratotic tissue between the fourth and fifth digits right foot with rotation of the fifth toe. Patient is found to have good digital perfusion and is well oriented 3     Assessment:  Acute plantar fasciitis right with moderate changes in the digit creating keratotic lesion was structural change       Plan:    H&P conditions reviewed with patient and at this point were to focus on the heel. I did educate her on digital deformity and possibility for arthroplasty in future but at this point I did inject the plantar fascial right 3 mg Kenalog 5 mg Xylocaine and applied fascial brace with instructions on usage and the usage of supportive shoes and physical therapy. Patient will be seen back to recheck  X-rays right indicate minimal spur formation with mild depression of the arch and no indication of stress fracture

## 2017-01-12 ENCOUNTER — Other Ambulatory Visit: Payer: Medicare Other

## 2017-01-13 LAB — HEPATIC FUNCTION PANEL
ALBUMIN: 4.3 g/dL (ref 3.5–4.8)
ALT: 21 IU/L (ref 0–32)
AST: 19 IU/L (ref 0–40)
Alkaline Phosphatase: 75 IU/L (ref 39–117)
BILIRUBIN TOTAL: 0.8 mg/dL (ref 0.0–1.2)
BILIRUBIN, DIRECT: 0.2 mg/dL (ref 0.00–0.40)
TOTAL PROTEIN: 6.9 g/dL (ref 6.0–8.5)

## 2017-01-13 LAB — LIPID PANEL
CHOL/HDL RATIO: 2.5 ratio (ref 0.0–4.4)
Cholesterol, Total: 128 mg/dL (ref 100–199)
HDL: 51 mg/dL (ref >39)
LDL CALC: 64 mg/dL (ref 0–99)
TRIGLYCERIDES: 67 mg/dL (ref 0–149)
VLDL Cholesterol Cal: 13 mg/dL (ref 5–40)

## 2017-01-15 ENCOUNTER — Encounter: Payer: Self-pay | Admitting: *Deleted

## 2017-01-18 NOTE — Progress Notes (Signed)
HPI The patient presents for follow up of CAD.  He has a mildly reduced EF and moderate AI with echo last month.  She has a distant history of stenting to her LAD. She presents for follow up.  She has had a reduced EF since she was seen previously by Dr. Olevia Perches.  I reviewed an echo from 2011 and the EF was 40 - 45%.  She had mild AI.  Her EF in Nov of last year was 40 - 45% with moderate AI.    On the echo follow up last month her EF was slightly lower athough this might be intraobserver variability.  (I reviewed these images personally.)  AI was moderate.  I titrated her meds at the last visit.    She returns for follow up.  She has had no SOB.  She has had some vague chest burning at one point in her left upper chest.  This is sporadic and mild.  ..  The patient denies any new symptoms such as chest discomfort, neck or arm discomfort. There has been no new shortness of breath, PND or orthopnea. There have been no reported palpitations, presyncope or syncope.  Since I last saw her she has done well.  The patient denies any new symptoms such as chest discomfort, neck or arm discomfort. There has been no new PND or orthopnea. There have been no reported palpitations, presyncope or syncope.   Allergies  Allergen Reactions  . Bee Venom Anaphylaxis  . Codeine Other (See Comments)    REACTION: Passed  out, facial swelling and hives  . Pneumovax 23 [Pneumococcal Vac Polyvalent] Swelling    Local swelling and pain  . Hornet Venom Hives    Hives over entire body  . Sulfonamide Derivatives Hives    Current Outpatient Prescriptions  Medication Sig Dispense Refill  . aspirin EC 81 MG tablet Take 81 mg by mouth daily.    Marland Kitchen atorvastatin (LIPITOR) 80 MG tablet Take 1 tablet (80 mg total) by mouth daily. 90 tablet 3  . Cholecalciferol (VITAMIN D3) 1000 UNITS CAPS Take 2 capsules by mouth daily.    . clobetasol ointment (TEMOVATE) 7.00 % Apply 1 application topically daily as needed (eczema).     .  EPINEPHrine 0.3 mg/0.3 mL IJ SOAJ injection Inject 0.3 mLs (0.3 mg total) into the muscle once. 2 Device 1  . levothyroxine (SYNTHROID, LEVOTHROID) 88 MCG tablet TAKE 1 TABLET BY MOUTH ONCE DAILY BEFORE  BREAKFAST 90 tablet 1  . lisinopril (PRINIVIL,ZESTRIL) 20 MG tablet Take 1 tablet (20 mg total) by mouth daily. 90 tablet 3  . carvedilol (COREG) 3.125 MG tablet Take 1 tablet (3.125 mg total) by mouth 2 (two) times daily. 60 tablet 3   No current facility-administered medications for this visit.     Past Medical History:  Diagnosis Date  . Blood transfusion without reported diagnosis   . BRONCHITIS   . CAD (coronary artery disease)    LAD stent 2003 ([facility]).    . Cataract   . DYSPNEA   . HLD (hyperlipidemia)   . Hypothyroidism   . Mobitz type 2 second degree heart block    s/p PPM by Dr Olevia Perches 2004  . PACEMAKER, PERMANENT     Past Surgical History:  Procedure Laterality Date  . CARDIAC CATHETERIZATION    . CYSTOSCOPY WITH RETROGRADE PYELOGRAM, URETEROSCOPY AND STENT PLACEMENT  08/19/2012   Procedure: CYSTOSCOPY WITH RETROGRADE PYELOGRAM, URETEROSCOPY AND STENT PLACEMENT;  Surgeon: Molli Hazard, MD;  Location: [facility];  Service: Urology;  Laterality: Right;  . MYOMECTOMY    . PACEMAKER GENERATOR CHANGE N/A 12/22/2011   Procedure: PACEMAKER GENERATOR CHANGE;  Surgeon: Thompson Grayer, MD;  Location: [facility] CATH LAB;  Service: Cardiovascular;  Laterality: N/A;  . PACEMAKER INSERTION  2004, 12/22/11   initial implant by Dr Olevia Perches, Generator change (MDT Adapta L) by Dr Rayann Heman 6/13  . TOTAL ABDOMINAL HYSTERECTOMY W/ BILATERAL SALPINGOOPHORECTOMY      ROS:  As stated in the HPI and negative for all other systems.  PHYSICAL EXAM BP 124/70   Pulse 70   Ht 5\' 10"  (1.778 m)   Wt 170 lb (77.1 kg)   LMP 10/17/1986   BMI 24.39 kg/m  BP 124/70   Pulse 70   Ht 5\' 10"  (1.778 m)   Wt 170 lb (77.1 kg)   LMP 10/17/1986   BMI 24.39 kg/m    GENERAL:  Well appearing NECK:   No jugular venous distention, waveform within normal limits, carotid upstroke brisk and symmetric, no bruits, no thyromegaly LUNGS:  Clear to auscultation bilaterally BACK:  No CVA tenderness CHEST:  Unremarkable HEART:  PMI not displaced or sustained,S1 and S2 within normal limits, no S3, no S4, no clicks, no rubs, Soft brief apical systolic murmur, no diastolic murmurs ABD:  Flat, positive bowel sounds normal in frequency in pitch, no bruits, no rebound, no guarding, no midline pulsatile mass, no hepatomegaly, no splenomegaly EXT:  2 plus pulses throughout, no edema, no cyanosis no clubbing  EKG:  Sinus rhythm with ventricular pacing 100%. Lab Results  Component Value Date   CHOL 128 01/12/2017   TRIG 67 01/12/2017   HDL 51 01/12/2017   LDLCALC 64 01/12/2017    ASSESSMENT AND PLAN   CAD:     Given the chest discomfort and her previous history she'll be screened with a Lexiscan Myoview.  STATUS POST PACEMAKER:   She is up to date with follow up with Dr. Rayann Heman.     HTN:   This is being managed in the context of treating his CHF  DYSLIPIDEMIA:    I would suggest a target LDL of 70 or lower.  At the last visit I increased her Lipitor.  Her response was excellent in her lipids are as above. She will continue on this regimen.  REDUCED EF:   EF was 35% on echo last a few weeks ago.  I reviewed this and it is not clear that this is lower than the chronic mild LV dysfunction that she has had.  Today I'm going to add a low-dose beta blocker and continue to titrate overtime.I will follow-up with a repeat echocardiogram in November.  AI/MR:  There is moderate AI.  This is in my view not changed and I don't see that this is the cause of her reduced EF.  Her LV dimensions are unchanged over time and there is not global dilatation.  She does not have severe AI and no symptoms.  I will follow this as above.

## 2017-01-19 ENCOUNTER — Ambulatory Visit (INDEPENDENT_AMBULATORY_CARE_PROVIDER_SITE_OTHER): Payer: Medicare Other | Admitting: Cardiology

## 2017-01-19 ENCOUNTER — Ambulatory Visit: Payer: Medicare Other | Admitting: Podiatry

## 2017-01-19 ENCOUNTER — Encounter: Payer: Self-pay | Admitting: Cardiology

## 2017-01-19 VITALS — BP 124/70 | HR 70 | Ht 70.0 in | Wt 170.0 lb

## 2017-01-19 DIAGNOSIS — E785 Hyperlipidemia, unspecified: Secondary | ICD-10-CM

## 2017-01-19 DIAGNOSIS — I351 Nonrheumatic aortic (valve) insufficiency: Secondary | ICD-10-CM | POA: Diagnosis not present

## 2017-01-19 DIAGNOSIS — I25119 Atherosclerotic heart disease of native coronary artery with unspecified angina pectoris: Secondary | ICD-10-CM

## 2017-01-19 DIAGNOSIS — I255 Ischemic cardiomyopathy: Secondary | ICD-10-CM | POA: Diagnosis not present

## 2017-01-19 MED ORDER — CARVEDILOL 3.125 MG PO TABS: 3.1250 mg | ORAL_TABLET | Freq: Two times a day (BID) | ORAL | 3 refills | Status: DC

## 2017-01-19 NOTE — Patient Instructions (Signed)
Medication Instructions:  START Coreg 3.125mg  Take 1 tablet twice a day  Labwork: None   Testing/Procedures: Your physician has requested that you have a lexiscan myoview. For further information please visit [email or web address]. Please follow instruction sheet, as given. Test will be done at [facility] has requested that you have an echocardiogram. Echocardiography is a painless test that uses sound waves to create images of your heart. It provides your doctor with information about the size and shape of your heart and how well your heart's chambers and valves are working. This procedure takes approximately one hour. There are no restrictions for this procedure. Test will be done at the [facility] IN November 2018  Follow-Up: Your physician recommends that you schedule a follow-up appointment in: [place]  Any Other Special Instructions Will Be Listed Below (If Applicable).     If you need a refill on your cardiac medications before your next appointment, please call your pharmacy.

## 2017-01-20 ENCOUNTER — Encounter: Payer: Self-pay | Admitting: Cardiology

## 2017-01-20 DIAGNOSIS — I25119 Atherosclerotic heart disease of native coronary artery with unspecified angina pectoris: Secondary | ICD-10-CM | POA: Insufficient documentation

## 2017-01-20 DIAGNOSIS — E785 Hyperlipidemia, unspecified: Secondary | ICD-10-CM | POA: Insufficient documentation

## 2017-01-23 ENCOUNTER — Telehealth (HOSPITAL_COMMUNITY): Payer: Self-pay | Admitting: Cardiology

## 2017-01-24 NOTE — Telephone Encounter (Signed)
01/23/2017 02:50 PM Phone (Outgoing) Schaefer, Madeline Dornbush (Self) [phone] (H)   Completed - Called pt and spoke with her and she voiced that she did not have her calendar on her and she would call me back either today or tomorrow.     By Verdene Rio

## 2017-01-25 ENCOUNTER — Telehealth: Payer: Self-pay | Admitting: Cardiology

## 2017-01-25 NOTE — Telephone Encounter (Signed)
Left message for patient to call and schedule follow up appt with dr hochrein after her stress test.

## 2017-01-29 ENCOUNTER — Encounter: Payer: Self-pay | Admitting: Family

## 2017-01-29 ENCOUNTER — Ambulatory Visit (INDEPENDENT_AMBULATORY_CARE_PROVIDER_SITE_OTHER): Payer: Medicare Other | Admitting: Family

## 2017-01-29 VITALS — BP 84/55 | HR 82 | Temp 98.1°F | Resp 16 | Ht 70.0 in | Wt 168.0 lb

## 2017-01-29 DIAGNOSIS — J029 Acute pharyngitis, unspecified: Secondary | ICD-10-CM

## 2017-01-29 DIAGNOSIS — I959 Hypotension, unspecified: Secondary | ICD-10-CM | POA: Diagnosis not present

## 2017-01-29 DIAGNOSIS — E785 Hyperlipidemia, unspecified: Secondary | ICD-10-CM

## 2017-01-29 DIAGNOSIS — J020 Streptococcal pharyngitis: Secondary | ICD-10-CM | POA: Diagnosis not present

## 2017-01-29 LAB — POCT RAPID STREP A (OFFICE): RAPID STREP A SCREEN: POSITIVE — AB

## 2017-01-29 MED ORDER — AMOXICILLIN 500 MG PO CAPS: 500.0000 mg | ORAL_CAPSULE | Freq: Three times a day (TID) | ORAL | 0 refills | Status: DC

## 2017-01-29 MED ORDER — LISINOPRIL 20 MG PO TABS: 10.0000 mg | ORAL_TABLET | Freq: Every day | ORAL | 3 refills | Status: DC

## 2017-01-29 MED FILL — AMOXICILLIN 500 MG CAPSULE: 500 | 10 days supply | Qty: 30 | Fill #0

## 2017-01-29 NOTE — Patient Instructions (Addendum)
Drink plenty of fluids today. Start amoxicillin for strep throat.   Do not take your evening dose of coreg. Tomorrow AM, you may restart coreg. Decrease lisinopril to 10mg  (1/2 tablet) once daily.  Check your blood pressure tomorrow AM and contact me with your blood pressure reading tomorrow.

## 2017-01-29 NOTE — Progress Notes (Signed)
Subjective:    Patient ID: Madeline Schaefer, female    DOB: [birth year], 71 y.o.   MRN: [id number]  HPI  Ms. Fauble is a 71 yr old female who presents today with chief complaint of sore throat.  Pt reports that sore throat began yesterday. Sore throat is accompanied by low grade fever. She denies dizziness/weakness. She does report poor fluid intake today due to her sore throat.   BP Readings from Last 3 Encounters:  01/29/17 94/60  01/19/17 124/70  01/05/17 122/68      Review of Systems See HPI  Past Medical History:  Diagnosis Date  . Blood transfusion without reported diagnosis   . BRONCHITIS   . CAD (coronary artery disease)    LAD stent 2003 ([facility]).    . Cataract   . DYSPNEA   . HLD (hyperlipidemia)   . Hypothyroidism   . Mobitz type 2 second degree heart block    s/p PPM by Dr Olevia Perches 2004  . PACEMAKER, PERMANENT      Social History   Social History  . Marital status: Married    Spouse name: N/A  . Number of children: 0  . Years of education: N/A   Occupational History  . Not on file.   Social History Main Topics  . Smoking status: Never Smoker  . Smokeless tobacco: Never Used  . Alcohol use Yes     Comment: 1 drink daily  . Drug use: No  . Sexual activity: Yes   Other Topics Concern  . Not on file   Social History Narrative   Retired Furniture conservator/restorer   Enjoys RV, bikes   No children   No pets       Past Surgical History:  Procedure Laterality Date  . CARDIAC CATHETERIZATION    . CYSTOSCOPY WITH RETROGRADE PYELOGRAM, URETEROSCOPY AND STENT PLACEMENT  08/19/2012   Procedure: CYSTOSCOPY WITH RETROGRADE PYELOGRAM, URETEROSCOPY AND STENT PLACEMENT;  Surgeon: Molli Hazard, MD;  Location: [facility];  Service: Urology;  Laterality: Right;  . MYOMECTOMY    . PACEMAKER GENERATOR CHANGE N/A 12/22/2011   Procedure: PACEMAKER GENERATOR CHANGE;  Surgeon: Thompson Grayer, MD;  Location: [facility] CATH LAB;  Service: Cardiovascular;   Laterality: N/A;  . PACEMAKER INSERTION  2004, 12/22/11   initial implant by Dr Olevia Perches, Generator change (MDT Adapta L) by Dr Rayann Heman 6/13  . TOTAL ABDOMINAL HYSTERECTOMY W/ BILATERAL SALPINGOOPHORECTOMY      Family History  Problem Relation Age of Onset  . Heart attack Mother        died at age 70  . Breast cancer Mother        age 69, had mastectomy.   . Prostate cancer Father     Allergies  Allergen Reactions  . Bee Venom Anaphylaxis  . Codeine Other (See Comments)    REACTION: Passed  out, facial swelling and hives  . Pneumovax 23 [Pneumococcal Vac Polyvalent] Swelling    Local swelling and pain  . Hornet Venom Hives    Hives over entire body  . Sulfonamide Derivatives Hives    Current Outpatient Prescriptions on File Prior to Visit  Medication Sig Dispense Refill  . aspirin EC 81 MG tablet Take 81 mg by mouth daily.    Marland Kitchen atorvastatin (LIPITOR) 80 MG tablet Take 1 tablet (80 mg total) by mouth daily. 90 tablet 3  . carvedilol (COREG) 3.125 MG tablet Take 1 tablet (3.125 mg total) by mouth 2 (two) times daily. [place]  tablet 3  . Cholecalciferol (VITAMIN D3) 1000 UNITS CAPS Take 2 capsules by mouth daily.    . clobetasol ointment (TEMOVATE) 1.02 % Apply 1 application topically daily as needed (eczema).     . EPINEPHrine 0.3 mg/0.3 mL IJ SOAJ injection Inject 0.3 mLs (0.3 mg total) into the muscle once. 2 Device 1  . levothyroxine (SYNTHROID, LEVOTHROID) 88 MCG tablet TAKE 1 TABLET BY MOUTH ONCE DAILY BEFORE  BREAKFAST 90 tablet 1  . lisinopril (PRINIVIL,ZESTRIL) 20 MG tablet Take 1 tablet (20 mg total) by mouth daily. 90 tablet 3   No current facility-administered medications on file prior to visit.     BP 94/60 (BP Location: Right Arm, Cuff Size: Normal)   Pulse 82   Temp 98.1 F (36.7 C) (Oral)   Resp 16   Ht 5\' 10"  (1.778 m)   Wt 168 lb (76.2 kg)   LMP 10/17/1986   SpO2 100%   BMI 24.11 kg/m       Objective:   Physical Exam  Constitutional: She appears  well-developed and well-nourished.  HENT:  Head: Atraumatic.  Right Ear: Tympanic membrane and ear canal normal.  Mouth/Throat: Posterior oropharyngeal erythema present. No oropharyngeal exudate or posterior oropharyngeal edema.  L TM occluded by cerumen.  No obvious exudates but exam limited by pt not being able to tolerate tongue depressor.   Cardiovascular: Normal rate, regular rhythm and normal heart sounds.   No murmur heard. Pulmonary/Chest: Effort normal and breath sounds normal. No respiratory distress. She has no wheezes.  Psychiatric: She has a normal mood and affect. Her behavior is normal. Judgment and thought content normal.          Assessment & Plan:  Strep pharyngitis- new. rx with amoxicillin. Advised tylenol prn pain.   Hypotension- I suspect that this is multifactorial- dehydration due to poor po intake in setting of sore throat and also addition of coreg by her cardiologist 1 week ago.  I have advised pt as follows:   Push fluids today. Do not take your evening dose of coreg. Tomorrow AM, you may restart coreg. Decrease lisinopril to 10mg  (1/2 tablet) once daily.  Check your blood pressure tomorrow AM and contact me with your blood pressure reading tomorrow

## 2017-01-30 ENCOUNTER — Telehealth: Payer: Self-pay | Admitting: *Deleted

## 2017-01-30 ENCOUNTER — Telehealth: Payer: Self-pay | Admitting: Cardiology

## 2017-01-30 NOTE — Telephone Encounter (Signed)
Notified pt's spouses and he voices understanding. He wrote instructions down and read them back to me.

## 2017-01-30 NOTE — Telephone Encounter (Signed)
Pt called with BP readings per PCP request. 01/29/17 PM  136/83 01/30/17 AM  121/77  Please advise?

## 2017-01-30 NOTE — Telephone Encounter (Signed)
Called patient to schedule follow up with Dr. Percival Spanish following stress test.  [place] and wants to be called with results rather than make a trip back to [place].

## 2017-01-30 NOTE — Telephone Encounter (Signed)
Good, continue coreg twice daily and lisinopril at half tab once daily.

## 2017-02-01 ENCOUNTER — Telehealth: Payer: Self-pay | Admitting: Family

## 2017-02-01 ENCOUNTER — Telehealth: Payer: Self-pay | Admitting: Medical

## 2017-02-01 ENCOUNTER — Telehealth (HOSPITAL_COMMUNITY): Payer: Self-pay

## 2017-02-01 MED ORDER — BENZONATATE 100 MG PO CAPS: 100.0000 mg | ORAL_CAPSULE | Freq: Three times a day (TID) | ORAL | 0 refills | Status: DC | PRN

## 2017-02-01 NOTE — Telephone Encounter (Signed)
Also assuming she has cough although not reported in note sent to me.

## 2017-02-01 NOTE — Telephone Encounter (Signed)
Encounter complete. 

## 2017-02-01 NOTE — Telephone Encounter (Signed)
I sent benzonatate to her pharmacy. Covering for pcp today. She states not better and going out of town. If she wants could offer her appointment tomorrow. However relatively early in treatment for strep.

## 2017-02-01 NOTE — Telephone Encounter (Signed)
Relation to RP:ZPSU Call back number:[phone] Pharmacy: [facility] [address], [place] [phone] (Phone) [phone] (Fax)   D.Marnee Guarneri   Reason for call:  Patient last seen 01/29/17 for strep throat,patient states symptoms have not improved requesting benzonatate (TESSALON) 200 MG capsule, patient states she's going out of town tomorrow, requesting Rx today.please advise

## 2017-02-02 ENCOUNTER — Telehealth (HOSPITAL_COMMUNITY): Payer: Self-pay

## 2017-02-02 NOTE — Telephone Encounter (Signed)
See previous phone note of 02/01/17.

## 2017-02-02 NOTE — Telephone Encounter (Deleted)
February 02, 2017  Ronny Flurry, [place]      9:46 AM  Note    See previous phone note of 02/01/17.    February 01, 2017        2:51 PM  [place], Percell Miller, PA-C routed this conversation to [place], Linton Rump, [place]  Rudene Anda, RN  [place], Consuello Bossier, CMA  Saguier, Percell Miller, PA-C      2:50 PM  Note    Also assuming she has cough although not reported in note sent to me.

## 2017-02-02 NOTE — Telephone Encounter (Signed)
Patient called and stated please disregard previous message [place], [place] received benzonatate (TESSALON) 100 MG capsule.

## 2017-02-02 NOTE — Telephone Encounter (Signed)
Pt says that she confirmed with [facility]. Pharmacy is stating that they never received Rx. She would like further assistance with getting it.

## 2017-02-02 NOTE — Telephone Encounter (Signed)
Noted and agree. 

## 2017-02-02 NOTE — Telephone Encounter (Signed)
Encounter complete. 

## 2017-02-02 NOTE — Telephone Encounter (Signed)
Spoke with PCP about expected results from antibiotic and she states pt should see some improvement in 2 days. Spoke with pt and notified her of below. States she bought some otc cough syrup which seems to be helping. Also states she felt some better yesterday afternoon but feels tired again this morning. No longer running temp. Advised pt to continue antibiotic and let us know if symptoms start to worsen or if not resolved by completion of antibiotic. Pt voices understanding. Please advise if any further recommendations?

## 2017-02-02 NOTE — Telephone Encounter (Deleted)
Patient called and stated please disregard previous message Madeline Schaefer, [place] received benzonatate (TESSALON) 100 MG capsule.

## 2017-02-06 ENCOUNTER — Ambulatory Visit (HOSPITAL_COMMUNITY)
Admission: RE | Admit: 2017-02-06 | Discharge: 2017-02-06 | Disposition: A | Discharge status: Home / Self Care | Payer: Medicare Other | Source: Ambulatory Visit | Attending: Internal Medicine | Admitting: Internal Medicine

## 2017-02-06 DIAGNOSIS — I255 Ischemic cardiomyopathy: Secondary | ICD-10-CM | POA: Diagnosis not present

## 2017-02-06 DIAGNOSIS — Z9861 Coronary angioplasty status: Secondary | ICD-10-CM | POA: Insufficient documentation

## 2017-02-06 DIAGNOSIS — I2510 Atherosclerotic heart disease of native coronary artery without angina pectoris: Secondary | ICD-10-CM | POA: Diagnosis not present

## 2017-02-06 DIAGNOSIS — E079 Disorder of thyroid, unspecified: Secondary | ICD-10-CM | POA: Diagnosis not present

## 2017-02-06 DIAGNOSIS — I351 Nonrheumatic aortic (valve) insufficiency: Secondary | ICD-10-CM | POA: Insufficient documentation

## 2017-02-06 DIAGNOSIS — I10 Essential (primary) hypertension: Secondary | ICD-10-CM | POA: Insufficient documentation

## 2017-02-06 DIAGNOSIS — R9439 Abnormal result of other cardiovascular function study: Secondary | ICD-10-CM | POA: Insufficient documentation

## 2017-02-06 DIAGNOSIS — I441 Atrioventricular block, second degree: Secondary | ICD-10-CM | POA: Diagnosis not present

## 2017-02-06 DIAGNOSIS — Z950 Presence of cardiac pacemaker: Secondary | ICD-10-CM | POA: Insufficient documentation

## 2017-02-06 DIAGNOSIS — Z8249 Family history of ischemic heart disease and other diseases of the circulatory system: Secondary | ICD-10-CM | POA: Insufficient documentation

## 2017-02-06 LAB — MYOCARDIAL PERFUSION IMAGING
CHL CUP NUCLEAR SSS: 8
LV sys vol: 37 mL
LVDIAVOL: 91 mL (ref 46–106)
Peak HR: 96 {beats}/min
Rest HR: 61 {beats}/min
SDS: 1
SRS: 7
TID: 1.27

## 2017-02-06 MED ORDER — TECHNETIUM TC 99M TETROFOSMIN IV KIT
10.7000 | PACK | Freq: Once | INTRAVENOUS | Status: AC | PRN
  Administered 2017-02-06: 10.7 via INTRAVENOUS
  Filled 2017-02-06: qty 11

## 2017-02-06 MED ORDER — REGADENOSON 0.4 MG/5ML IV SOLN
0.4000 mg | Freq: Once | INTRAVENOUS | Status: AC
  Administered 2017-02-06: 0.4 mg via INTRAVENOUS

## 2017-02-06 MED ORDER — TECHNETIUM TC 99M TETROFOSMIN IV KIT
31.8000 | PACK | Freq: Once | INTRAVENOUS | Status: AC | PRN
  Administered 2017-02-06: 31.8 via INTRAVENOUS
  Filled 2017-02-06: qty 32

## 2017-02-07 ENCOUNTER — Telehealth: Payer: Self-pay | Admitting: *Deleted

## 2017-02-07 NOTE — Telephone Encounter (Signed)
-----   Message from Skeet Latch, MD sent at 02/07/2017 10:59 AM EDT ----- Low risk stress test.  No ishcemia

## 2017-02-07 NOTE — Telephone Encounter (Signed)
Left message to call back  

## 2017-02-08 NOTE — Telephone Encounter (Signed)
Patient ditrectly notified about normal myoview test; patient voiced understanding

## 2017-02-08 NOTE — Telephone Encounter (Signed)
F/U Call:  Patient returning call and states that she will be in class from 9-10:30 so please call after 10:30am.

## 2017-02-08 NOTE — Telephone Encounter (Signed)
lmtcb for myoview results

## 2017-02-12 ENCOUNTER — Ambulatory Visit (INDEPENDENT_AMBULATORY_CARE_PROVIDER_SITE_OTHER): Payer: Medicare Other | Admitting: Internal Medicine

## 2017-02-12 ENCOUNTER — Encounter: Payer: Self-pay | Admitting: Internal Medicine

## 2017-02-12 VITALS — BP 134/70 | HR 64 | Temp 98.3°F | Resp 14 | Ht 70.0 in | Wt 168.5 lb

## 2017-02-12 DIAGNOSIS — I10 Essential (primary) hypertension: Secondary | ICD-10-CM

## 2017-02-12 DIAGNOSIS — J020 Streptococcal pharyngitis: Secondary | ICD-10-CM | POA: Diagnosis not present

## 2017-02-12 NOTE — Progress Notes (Signed)
Subjective:    Patient ID: Madeline Schaefer, female    DOB: [birth year], 71 y.o.   MRN: [id number]  DOS:  02/12/2017 Type of visit - description : Acute Interval history: Was seen here 2 weeks ago with sore throat, low-grade temperature, rapid strep test +. Took amoxicillin and felt better. Is here because she has some residual symptoms, occasionally cough at night, mild scratchy throat.  Also, was prescribed carvedilol 01/19/2017. Since then noted her BP to be in the low side sometimes. 2 weeks ago, she was recommended to decrease lisinopril to half tablet (10 mg daily). Yesterday she took a hike, afterwards felt tired but no actual  weakness or dizziness  . Her BP was after the hike was 87/66 and 107/64. This morning it was 147/80.  Review of Systems No fever chills No itchy eyes, nose. No sneezing. No nausea or vomiting. Diarrhea 1 while on antibiotics. Minimal cough with no sputum production. No rash  Past Medical History:  Diagnosis Date  . Blood transfusion without reported diagnosis   . BRONCHITIS   . CAD (coronary artery disease)    LAD stent 2003 ([facility]).    . Cataract   . DYSPNEA   . HLD (hyperlipidemia)   . Hypothyroidism   . Mobitz type 2 second degree heart block    s/p PPM by Dr Olevia Perches 2004  . PACEMAKER, PERMANENT     Past Surgical History:  Procedure Laterality Date  . CARDIAC CATHETERIZATION    . CYSTOSCOPY WITH RETROGRADE PYELOGRAM, URETEROSCOPY AND STENT PLACEMENT  08/19/2012   Procedure: CYSTOSCOPY WITH RETROGRADE PYELOGRAM, URETEROSCOPY AND STENT PLACEMENT;  Surgeon: Molli Hazard, MD;  Location: [facility];  Service: Urology;  Laterality: Right;  . MYOMECTOMY    . PACEMAKER GENERATOR CHANGE N/A 12/22/2011   Procedure: PACEMAKER GENERATOR CHANGE;  Surgeon: Thompson Grayer, MD;  Location: [facility] CATH LAB;  Service: Cardiovascular;  Laterality: N/A;  . PACEMAKER INSERTION  2004, 12/22/11   initial implant by Dr Olevia Perches, Generator change (MDT  Adapta L) by Dr Rayann Heman 6/13  . TOTAL ABDOMINAL HYSTERECTOMY W/ BILATERAL SALPINGOOPHORECTOMY      Social History   Social History  . Marital status: Married    Spouse name: N/A  . Number of children: 0  . Years of education: N/A   Occupational History  . Not on file.   Social History Main Topics  . Smoking status: Never Smoker  . Smokeless tobacco: Never Used  . Alcohol use Yes     Comment: 1 drink daily  . Drug use: No  . Sexual activity: Yes   Other Topics Concern  . Not on file   Social History Narrative   Retired Furniture conservator/restorer   Enjoys RV, bikes   No children   No pets         Allergies as of 02/12/2017      Reactions   Bee Venom Anaphylaxis   Codeine Other (See Comments)   REACTION: Passed  out, facial swelling and hives   Pneumovax 23 [pneumococcal Vac Polyvalent] Swelling   Local swelling and pain   Hornet Venom Hives   Hives over entire body   Sulfonamide Derivatives Hives      Medication List       Accurate as of 02/12/17  2:40 PM. Always use your most recent med list.          amoxicillin 500 MG capsule Commonly known as:  AMOXIL Take 1 capsule (500 mg total)  by mouth 3 (three) times daily.   aspirin EC 81 MG tablet Take 81 mg by mouth daily.   atorvastatin 80 MG tablet Commonly known as:  LIPITOR Take 1 tablet (80 mg total) by mouth daily.   benzonatate 100 MG capsule Commonly known as:  TESSALON Take 1 capsule (100 mg total) by mouth 3 (three) times daily as needed for cough.   carvedilol 3.125 MG tablet Commonly known as:  COREG Take 1 tablet (3.125 mg total) by mouth 2 (two) times daily.   clobetasol ointment 0.05 % Commonly known as:  TEMOVATE Apply 1 application topically daily as needed (eczema).   EPINEPHrine 0.3 mg/0.3 mL Soaj injection Commonly known as:  EPI-PEN Inject 0.3 mLs (0.3 mg total) into the muscle once.   levothyroxine 88 MCG tablet Commonly known as:  SYNTHROID, LEVOTHROID TAKE 1 TABLET BY MOUTH  ONCE DAILY BEFORE  BREAKFAST   lisinopril 20 MG tablet Commonly known as:  PRINIVIL,ZESTRIL Take 0.5 tablets (10 mg total) by mouth daily.   Vitamin D3 1000 units Caps Take 2 capsules by mouth daily.          Objective:   Physical Exam BP 134/70 (BP Location: Left Arm, Patient Position: Sitting, Cuff Size: Small)   Pulse 64   Temp 98.3 F (36.8 C) (Oral)   Resp 14   Ht 5\' 10"  (1.778 m)   Wt 168 lb 8 oz (76.4 kg)   LMP 10/17/1986   SpO2 97%   BMI 24.18 kg/m  General:   Well developed, well nourished . NAD.  HEENT:  Normocephalic . Face symmetric, atraumatic. Throat symmetric, no red, no discharge Lungs:  CTA B Normal respiratory effort, no intercostal retractions, no accessory muscle use. Heart: RRR,  no murmur.  No pretibial edema bilaterally  Skin: Not pale. Not jaundice Neurologic:  alert & oriented X3.  Speech normal, gait appropriate for age and unassisted Psych--  Cognition and judgment appear intact.  Cooperative with normal attention span and concentration.  Behavior appropriate. No anxious or depressed appearing.      Assessment & Plan:   71 year old female with history of CAD, HTN, cardiomyopathy, pacemaker, osteopenia, high cholesterol, hyperglycemia, presents with the following:  Strep pharyngitis: Status post amoxicillin, has minimal residual symptoms, will check a throat culture to document cure; also rec  Flonase and Mucinex. Addendum- unable to get throat sample ; will rec observation HTN: Start carvedilol about 3 weeks ago, BP has been slightly low since then on and off. Lisinopril dose decreased from 20 mg to 10 mg already. Recommend to keep herself well-hydrated if she goes hiking, watch for symptoms of low blood pressure, continue documenting BP readings and call [name] or Dr.Hochrein  If problems.

## 2017-02-12 NOTE — Progress Notes (Signed)
Pre visit review using our clinic review tool, if applicable. No additional management support is needed unless otherwise documented below in the visit note. 

## 2017-02-12 NOTE — Patient Instructions (Signed)
Use Flonase nasal spray: 2 on each side of the nose every day  Mucinex DM as needed for cough  Keep yourself hydrated   Check the  blood pressure 4 or 5 times a  Week  Be sure your blood pressure is between 110/65 and  140/85.  if it is consistently higher or lower, let me know

## 2017-02-13 ENCOUNTER — Ambulatory Visit (INDEPENDENT_AMBULATORY_CARE_PROVIDER_SITE_OTHER): Payer: Medicare Other | Admitting: *Deleted

## 2017-02-13 DIAGNOSIS — I441 Atrioventricular block, second degree: Secondary | ICD-10-CM | POA: Diagnosis not present

## 2017-02-13 NOTE — Progress Notes (Signed)
Remote pacemaker transmission.   

## 2017-02-15 LAB — CUP PACEART REMOTE DEVICE CHECK
Brady Statistic AS VS Percent: 0 %
Implantable Lead Implant Date: 20040907
Implantable Lead Model: 5076
Lead Channel Impedance Value: 419 Ohm
Lead Channel Impedance Value: 566 Ohm
Lead Channel Pacing Threshold Amplitude: 0.625 V
Lead Channel Pacing Threshold Amplitude: 0.75 V
Lead Channel Pacing Threshold Pulse Width: 0.4 ms
Lead Channel Setting Pacing Amplitude: 2 V
MDC IDC LEAD IMPLANT DT: 20040907
MDC IDC LEAD LOCATION: 753859
MDC IDC LEAD LOCATION: 753860
MDC IDC MSMT BATTERY IMPEDANCE: 379 Ohm
MDC IDC MSMT BATTERY REMAINING LONGEVITY: 94 mo
MDC IDC MSMT BATTERY VOLTAGE: 2.78 V
MDC IDC MSMT LEADCHNL RA PACING THRESHOLD PULSEWIDTH: 0.4 ms
MDC IDC MSMT LEADCHNL RA SENSING INTR AMPL: 1 mV
MDC IDC PG IMPLANT DT: 20130607
MDC IDC SESS DTM: 20180731112406
MDC IDC SET LEADCHNL RV PACING AMPLITUDE: 2.5 V
MDC IDC SET LEADCHNL RV PACING PULSEWIDTH: 0.4 ms
MDC IDC SET LEADCHNL RV SENSING SENSITIVITY: 2 mV
MDC IDC STAT BRADY AP VP PERCENT: 6 %
MDC IDC STAT BRADY AP VS PERCENT: 0 %
MDC IDC STAT BRADY AS VP PERCENT: 94 %

## 2017-02-16 ENCOUNTER — Encounter: Payer: Self-pay | Admitting: Cardiology

## 2017-02-19 ENCOUNTER — Telehealth: Payer: Self-pay | Admitting: Family

## 2017-02-19 NOTE — Telephone Encounter (Signed)
Needs to be seen in the office please for re-evaluation.

## 2017-02-19 NOTE — Telephone Encounter (Signed)
Caller name:Hidaya Camera Relationship to patient: Can be [phone] Pharmacy:  Reason for call:Patient seen over 20 days again for strep throat, still not feeling better, having sinus symptons and throat is still swollen. Please advise.

## 2017-02-23 ENCOUNTER — Ambulatory Visit (INDEPENDENT_AMBULATORY_CARE_PROVIDER_SITE_OTHER): Payer: Medicare Other | Admitting: Family

## 2017-02-23 ENCOUNTER — Encounter: Payer: Self-pay | Admitting: Family

## 2017-02-23 VITALS — BP 141/88 | HR 67 | Temp 98.7°F | Resp 18 | Ht 70.0 in | Wt 170.2 lb

## 2017-02-23 DIAGNOSIS — R599 Enlarged lymph nodes, unspecified: Secondary | ICD-10-CM

## 2017-02-23 DIAGNOSIS — J020 Streptococcal pharyngitis: Secondary | ICD-10-CM | POA: Diagnosis not present

## 2017-02-23 LAB — POCT RAPID STREP A (OFFICE): Rapid Strep A Screen: POSITIVE — AB

## 2017-02-23 MED ORDER — CEFDINIR 300 MG PO CAPS: 300.0000 mg | ORAL_CAPSULE | Freq: Two times a day (BID) | ORAL | 0 refills | Status: DC

## 2017-02-23 NOTE — Patient Instructions (Signed)
Start cefdinir for strep throat. Call if your symptoms worsen or do not improve.   Strep Throat Strep throat is a bacterial infection of the throat. Your health care provider may call the infection tonsillitis or pharyngitis, depending on whether there is swelling in the tonsils or at the back of the throat. Strep throat is most common during the cold months of the year in children who are 26-70 years of age, but it can happen during any season in people of any age. This infection is spread from person to person (contagious) through coughing, sneezing, or close contact. What are the causes? Strep throat is caused by the bacteria called Streptococcus pyogenes. What increases the risk? This condition is more likely to develop in:  People who spend time in crowded places where the infection can spread easily.  People who have close contact with someone who has strep throat.  What are the signs or symptoms? Symptoms of this condition include:  Fever or chills.  Redness, swelling, or pain in the tonsils or throat.  Pain or difficulty when swallowing.  White or yellow spots on the tonsils or throat.  Swollen, tender glands in the neck or under the jaw.  Red rash all over the body (rare).  How is this diagnosed? This condition is diagnosed by performing a rapid strep test or by taking a swab of your throat (throat culture test). Results from a rapid strep test are usually ready in a few minutes, but throat culture test results are available after one or two days. How is this treated? This condition is treated with antibiotic medicine. Follow these instructions at home: Medicines  Take over-the-counter and prescription medicines only as told by your health care provider.  Take your antibiotic as told by your health care provider. Do not stop taking the antibiotic even if you start to feel better.  Have family members who also have a sore throat or fever tested for strep throat. They  may need antibiotics if they have the strep infection. Eating and drinking  Do not share food, drinking cups, or personal items that could cause the infection to spread to other people.  If swallowing is difficult, try eating soft foods until your sore throat feels better.  Drink enough fluid to keep your urine clear or pale yellow. General instructions  Gargle with a salt-water mixture 3-4 times per day or as needed. To make a salt-water mixture, completely dissolve -1 tsp of salt in 1 cup of warm water.  Make sure that all household members wash their hands well.  Get plenty of rest.  Stay home from school or work until you have been taking antibiotics for 24 hours.  Keep all follow-up visits as told by your health care provider. This is important. Contact a health care provider if:  The glands in your neck continue to get bigger.  You develop a rash, cough, or earache.  You cough up a thick liquid that is green, yellow-brown, or bloody.  You have pain or discomfort that does not get better with medicine.  Your problems seem to be getting worse rather than better.  You have a fever. Get help right away if:  You have new symptoms, such as vomiting, severe headache, stiff or painful neck, chest pain, or shortness of breath.  You have severe throat pain, drooling, or changes in your voice.  You have swelling of the neck, or the skin on the neck becomes red and tender.  You have signs  of dehydration, such as fatigue, dry mouth, and decreased urination.  You become increasingly sleepy, or you cannot wake up completely.  Your joints become red or painful. This information is not intended to replace advice given to you by your health care provider. Make sure you discuss any questions you have with your health care provider. Document Released: 06/30/2000 Document Revised: 03/01/2016 Document Reviewed: 10/26/2014 Elsevier Interactive Patient Education  2017 [employer].

## 2017-02-23 NOTE — Progress Notes (Signed)
Subjective:    Patient ID: Madeline Schaefer, female    DOB: Jan 09, 1946, 71 y.o.   MRN: [id number]  HPI  Madeline Schaefer is a 71 yr old female who presents today with chief complaint of swollen lymph node on the left side of her neck. Notes intermittent sore throat and + fatigue.    Review of Systems See HPI  Past Medical History:  Diagnosis Date  . Blood transfusion without reported diagnosis   . BRONCHITIS   . CAD (coronary artery disease)    LAD stent 2003 ([facility]).    . Cataract   . DYSPNEA   . HLD (hyperlipidemia)   . Hypothyroidism   . Mobitz type 2 second degree heart block    s/p PPM by Dr Olevia Perches 2004  . PACEMAKER, PERMANENT      Social History   Social History  . Marital status: Married    Spouse name: N/A  . Number of children: 0  . Years of education: N/A   Occupational History  . Not on file.   Social History Main Topics  . Smoking status: Never Smoker  . Smokeless tobacco: Never Used  . Alcohol use Yes     Comment: 1 drink daily  . Drug use: No  . Sexual activity: Yes   Other Topics Concern  . Not on file   Social History Narrative   Retired Furniture conservator/restorer   Enjoys RV, bikes   No children   No pets       Past Surgical History:  Procedure Laterality Date  . CARDIAC CATHETERIZATION    . CYSTOSCOPY WITH RETROGRADE PYELOGRAM, URETEROSCOPY AND STENT PLACEMENT  08/19/2012   Procedure: CYSTOSCOPY WITH RETROGRADE PYELOGRAM, URETEROSCOPY AND STENT PLACEMENT;  Surgeon: Molli Hazard, MD;  Location: [facility];  Service: Urology;  Laterality: Right;  . MYOMECTOMY    . PACEMAKER GENERATOR CHANGE N/A 12/22/2011   Procedure: PACEMAKER GENERATOR CHANGE;  Surgeon: Thompson Grayer, MD;  Location: [facility] CATH LAB;  Service: Cardiovascular;  Laterality: N/A;  . PACEMAKER INSERTION  2004, 12/22/11   initial implant by Dr Olevia Perches, Generator change (MDT Adapta L) by Dr Rayann Heman 6/13  . TOTAL ABDOMINAL HYSTERECTOMY W/ BILATERAL SALPINGOOPHORECTOMY        Family History  Problem Relation Age of Onset  . Heart attack Mother        died at age 25  . Breast cancer Mother        age 47, had mastectomy.   . Prostate cancer Father     Allergies  Allergen Reactions  . Bee Venom Anaphylaxis  . Codeine Other (See Comments)    REACTION: Passed  out, facial swelling and hives  . Pneumovax 23 [Pneumococcal Vac Polyvalent] Swelling    Local swelling and pain  . Hornet Venom Hives    Hives over entire body  . Sulfonamide Derivatives Hives    Current Outpatient Prescriptions on File Prior to Visit  Medication Sig Dispense Refill  . aspirin EC 81 MG tablet Take 81 mg by mouth daily.    Marland Kitchen atorvastatin (LIPITOR) 80 MG tablet Take 1 tablet (80 mg total) by mouth daily. 90 tablet 3  . benzonatate (TESSALON) 100 MG capsule Take 1 capsule (100 mg total) by mouth 3 (three) times daily as needed for cough. 21 capsule 0  . carvedilol (COREG) 3.125 MG tablet Take 1 tablet (3.125 mg total) by mouth 2 (two) times daily. 60 tablet 3  . Cholecalciferol (VITAMIN D3) 1000  UNITS CAPS Take 2 capsules by mouth daily.    . clobetasol ointment (TEMOVATE) 9.37 % Apply 1 application topically daily as needed (eczema).     . EPINEPHrine 0.3 mg/0.3 mL IJ SOAJ injection Inject 0.3 mLs (0.3 mg total) into the muscle once. 2 Device 1  . levothyroxine (SYNTHROID, LEVOTHROID) 88 MCG tablet TAKE 1 TABLET BY MOUTH ONCE DAILY BEFORE  BREAKFAST 90 tablet 1  . lisinopril (PRINIVIL,ZESTRIL) 20 MG tablet Take 0.5 tablets (10 mg total) by mouth daily. 90 tablet 3   No current facility-administered medications on file prior to visit.     BP (!) 141/88 (BP Location: Right Arm, Cuff Size: Normal)   Pulse 67   Temp 98.7 F (37.1 C) (Oral)   Resp 18   Ht 5\' 10"  (1.778 m)   Wt 170 lb 3.2 oz (77.2 kg)   LMP 10/17/1986   SpO2 98%   BMI 24.42 kg/m       Objective:   Physical Exam  Constitutional: She is oriented to person, place, and time. She appears well-developed and  well-nourished.  HENT:  Head: Atraumatic.  Mouth/Throat: Posterior oropharyngeal erythema present.  Cardiovascular: Normal rate, regular rhythm and normal heart sounds.   No murmur heard. Pulmonary/Chest: Effort normal and breath sounds normal. No respiratory distress. She has no wheezes.  Lymphadenopathy:    She has cervical adenopathy.  Neurological: She is alert and oriented to person, place, and time.  Psychiatric: She has a normal mood and affect. Her behavior is normal. Judgment and thought content normal.          Assessment & Plan:  Strep throat- rapid strep +. Failed amoxicillin.  Will rx with cefdinir. She is advised to call if new/worsening symptoms or if symptoms do not improve.

## 2017-03-01 ENCOUNTER — Telehealth: Payer: Self-pay | Admitting: Cardiology

## 2017-03-01 NOTE — Telephone Encounter (Signed)
Attempt to call patient, call was picked up and then patient ended call.

## 2017-03-01 NOTE — Telephone Encounter (Signed)
New message    Pt is calling about her BP. She said her PCP decreased her medicine because her BP has been running low.

## 2017-03-05 NOTE — Telephone Encounter (Signed)
Left message for pt to call.

## 2017-03-06 ENCOUNTER — Ambulatory Visit (INDEPENDENT_AMBULATORY_CARE_PROVIDER_SITE_OTHER): Payer: Medicare Other | Admitting: Sports Medicine

## 2017-03-06 ENCOUNTER — Encounter: Payer: Self-pay | Admitting: Sports Medicine

## 2017-03-06 VITALS — BP 121/64 | HR 67 | Resp 16

## 2017-03-06 DIAGNOSIS — M79671 Pain in right foot: Secondary | ICD-10-CM

## 2017-03-06 DIAGNOSIS — L84 Corns and callosities: Secondary | ICD-10-CM

## 2017-03-06 DIAGNOSIS — M722 Plantar fascial fibromatosis: Secondary | ICD-10-CM

## 2017-03-06 DIAGNOSIS — M2041 Other hammer toe(s) (acquired), right foot: Secondary | ICD-10-CM

## 2017-03-06 MED ORDER — TRIAMCINOLONE ACETONIDE 10 MG/ML IJ SUSP: 10.0000 mg | Freq: Once | INTRAMUSCULAR | Status: DC

## 2017-03-06 NOTE — Progress Notes (Signed)
Subjective: Madeline Schaefer is a 71 y.o. female returns to office for follow up evaluation after Right heel injection for plantar fasciitis, injection #1 administered 8 weeks ago with Dr. Paulla Dolly. Reports the injection helped at 1st but now pain is back and has been flaring up for the last week. Patient thinks that going without her brace and chuka sandals have caused it to come back. Patient states that she has a corn on the side of the right 4th toe that she wants looked at as well. Patient denies any other pedal complaints at this time.   Patient Active Problem List   Diagnosis Date Noted  . Dyslipidemia 01/20/2017  . Coronary artery disease involving native coronary artery of native heart with angina pectoris ([place]) 01/20/2017  . Aortic valve regurgitation 12/15/2016  . Ischemic cardiomyopathy 12/15/2016  . Osteopenia 11/16/2014  . Adjustment disorder with mixed anxiety and depressed mood 09/26/2013  . Essential hypertension 08/21/2013  . Special screening for malignant neoplasms, colon 08/19/2013  . Hematuria 11/20/2012  . Hyperglycemia 11/20/2012  . Eczema 11/20/2012  . Routine general medical examination at a health care facility 11/20/2012  . Second degree Mobitz II AV block 01/27/2011  . DYSPNEA 12/15/2009  . PACEMAKER-[employer] 12/01/2008  . Hypothyroidism 08/03/2008  . Hyperlipidemia 08/03/2008  . Coronary atherosclerosis 08/03/2008    Current Outpatient Prescriptions on File Prior to Visit  Medication Sig Dispense Refill  . aspirin EC 81 MG tablet Take 81 mg by mouth daily.    Marland Kitchen atorvastatin (LIPITOR) 80 MG tablet Take 1 tablet (80 mg total) by mouth daily. 90 tablet 3  . benzonatate (TESSALON) 100 MG capsule Take 1 capsule (100 mg total) by mouth 3 (three) times daily as needed for cough. 21 capsule 0  . carvedilol (COREG) 3.125 MG tablet Take 1 tablet (3.125 mg total) by mouth 2 (two) times daily. 60 tablet 3  . cefdinir (OMNICEF) 300 MG capsule Take 1 capsule (300 mg  total) by mouth 2 (two) times daily. 20 capsule 0  . Cholecalciferol (VITAMIN D3) 1000 UNITS CAPS Take 2 capsules by mouth daily.    . clobetasol ointment (TEMOVATE) 6.96 % Apply 1 application topically daily as needed (eczema).     . EPINEPHrine 0.3 mg/0.3 mL IJ SOAJ injection Inject 0.3 mLs (0.3 mg total) into the muscle once. 2 Device 1  . levothyroxine (SYNTHROID, LEVOTHROID) 88 MCG tablet TAKE 1 TABLET BY MOUTH ONCE DAILY BEFORE  BREAKFAST 90 tablet 1  . lisinopril (PRINIVIL,ZESTRIL) 20 MG tablet Take 0.5 tablets (10 mg total) by mouth daily. 90 tablet 3   No current facility-administered medications on file prior to visit.     Allergies  Allergen Reactions  . Bee Venom Anaphylaxis  . Codeine Other (See Comments)    REACTION: Passed  out, facial swelling and hives  . Pneumovax 23 [Pneumococcal Vac Polyvalent] Swelling    Local swelling and pain  . Hornet Venom Hives    Hives over entire body  . Sulfonamide Derivatives Hives    Objective:   General:  Alert and oriented x 3, in no acute distress  Dermatology: Skin is warm, dry, and supple bilateral. Nails are within normal limits. There is no lower extremity erythema, no eccymosis, no open lesions present bilateral. Soft corn at lateral 4th toe on right with no signs of infection.   Vascular: Dorsalis Pedis and Posterior Tibial pedal pulses are 2/4 bilateral. + hair growth noted bilateral. Capillary Fill Time is 3 seconds in all digits. No  varicosities, No edema bilateral lower extremities.   Neurological: Sensation grossly intact to light touch with an achilles reflex of +2 and a  negative Tinel's sign bilateral.  Musculoskeletal: There is tenderness to palpation at the medial calcaneal tubercale and through the insertion of the plantar fascia on the right foot. No pain with compression to calcaneus or application of tuning fork. There is decreased Ankle joint range of motion bilateral. Mild varus hammertoe 4-5 on right. All  other joints range of motion  within normal limits bilateral. Strength 5/5 bilateral.   Assessment and Plan: Problem List Items Addressed This Visit    None    Visit Diagnoses    Plantar fasciitis    -  Primary   Relevant Medications   triamcinolone acetonide (KENALOG) 10 MG/ML injection 10 mg (Start on 03/06/2017  5:15 PM)   Pain of right heel       Relevant Medications   triamcinolone acetonide (KENALOG) 10 MG/ML injection 10 mg (Start on 03/06/2017  5:15 PM)   Hammer toe of right foot       Corn of toe         -Complete examination performed.  -Previous x-rays reviewed. -Discussed with patient in detail the condition of plantar fasciitis, how this  occurs related to the foot type of the patient and general treatment options. - Patient opted for another injection today; After oral consent and aseptic prep, injected a mixture containing 1 ml of 1%plain lidocaine, 1 ml 0.5% plain marcaine, 0.5 ml of kenalog 40 and 0.5 ml of dexmethasone phosphate to right heel at area of most pain/trigger point injection. -Continue with plantar fascial brace -Continue with stretching, icing, good supportive shoes, inserts daily.  -Discussed long term care and reocurrence; will closely monitor; if fails to improve will consider other treatment modalities.  -Patient to return to office as needed or sooner if problems or questions arise.  Landis Martins, DPM

## 2017-03-06 NOTE — Patient Instructions (Signed)

## 2017-03-07 NOTE — Telephone Encounter (Signed)
Left message that office had attempted to call back last week and this week, if continuing to have issues please call back

## 2017-03-21 ENCOUNTER — Telehealth: Payer: Self-pay | Admitting: Family

## 2017-03-21 NOTE — Telephone Encounter (Signed)
Spoke with pt's spouse. they have not checked coverage with insurance yet for the Shingrix vaccine. He will do that first. Also advised spouse that medication is on a limited supply from the manufacturer and I am not able to schedule new vaccinations at this time. Advised him to check with their pharmacy to see if they have available stock and we can send a prescription to them when they are ready to proceed. Pt's spouse voices understanding

## 2017-03-21 NOTE — Telephone Encounter (Signed)
Pt would like to have shingles vac.   Please call when able to schedule.

## 2017-05-15 ENCOUNTER — Ambulatory Visit (INDEPENDENT_AMBULATORY_CARE_PROVIDER_SITE_OTHER): Payer: Medicare Other | Admitting: *Deleted

## 2017-05-15 DIAGNOSIS — I441 Atrioventricular block, second degree: Secondary | ICD-10-CM

## 2017-05-17 NOTE — Progress Notes (Signed)
Remote pacemaker transmission.   

## 2017-05-18 LAB — CUP PACEART REMOTE DEVICE CHECK
Battery Impedance: 427 Ohm
Battery Voltage: 2.79 V
Brady Statistic AP VP Percent: 9 %
Brady Statistic AP VS Percent: 0 %
Brady Statistic AS VS Percent: 0 %
Implantable Lead Implant Date: 20040907
Implantable Lead Implant Date: 20040907
Implantable Lead Location: 753860
Implantable Lead Model: 5076
Lead Channel Impedance Value: 431 Ohm
Lead Channel Impedance Value: 630 Ohm
Lead Channel Pacing Threshold Pulse Width: 0.4 ms
Lead Channel Sensing Intrinsic Amplitude: 1 mV
Lead Channel Setting Pacing Amplitude: 2 V
Lead Channel Setting Pacing Amplitude: 2.5 V
MDC IDC LEAD LOCATION: 753859
MDC IDC MSMT BATTERY REMAINING LONGEVITY: 91 mo
MDC IDC MSMT LEADCHNL RA PACING THRESHOLD AMPLITUDE: 0.625 V
MDC IDC MSMT LEADCHNL RV PACING THRESHOLD AMPLITUDE: 0.75 V
MDC IDC MSMT LEADCHNL RV PACING THRESHOLD PULSEWIDTH: 0.4 ms
MDC IDC PG IMPLANT DT: 20130607
MDC IDC SESS DTM: 20181030202520
MDC IDC SET LEADCHNL RV PACING PULSEWIDTH: 0.4 ms
MDC IDC SET LEADCHNL RV SENSING SENSITIVITY: 2 mV
MDC IDC STAT BRADY AS VP PERCENT: 91 %

## 2017-05-23 ENCOUNTER — Encounter: Payer: Self-pay | Admitting: Cardiology

## 2017-05-25 ENCOUNTER — Ambulatory Visit: Payer: Medicare Other | Admitting: Family

## 2017-05-25 ENCOUNTER — Encounter: Payer: Self-pay | Admitting: Family

## 2017-05-25 VITALS — BP 161/77 | HR 64 | Temp 98.4°F | Resp 16 | Ht 70.0 in | Wt 170.6 lb

## 2017-05-25 DIAGNOSIS — J329 Chronic sinusitis, unspecified: Secondary | ICD-10-CM | POA: Diagnosis not present

## 2017-05-25 MED ORDER — AMOXICILLIN-POT CLAVULANATE 875-125 MG PO TABS: 1.0000 | ORAL_TABLET | Freq: Two times a day (BID) | ORAL | 0 refills | Status: DC

## 2017-05-25 NOTE — Progress Notes (Signed)
Subjective:    Patient ID: Madeline Schaefer, female    DOB: 11/25/1945, 71 y.o.   MRN: [id number]  HPI  [name] is a 71 yr old female who presents today with chief complaint of cough/nasal congestion. Has been present x 2.5 weeks. Using mucinex, cough/sinus medication.  Reports pressure in the sinus area and behind her eyes.  Reports yellow nasal discharge.   Review of Systems See HPI  Past Medical History:  Diagnosis Date  . Blood transfusion without reported diagnosis   . BRONCHITIS   . CAD (coronary artery disease)    LAD stent 2003 ([facility]).    . Cataract   . DYSPNEA   . HLD (hyperlipidemia)   . Hypothyroidism   . Mobitz type 2 second degree heart block    s/p PPM by Dr Olevia Perches 2004  . PACEMAKER, PERMANENT      Social History   Socioeconomic History  . Marital status: Married    Spouse name: Not on file  . Number of children: 0  . Years of education: Not on file  . Highest education level: Not on file  Social Needs  . Financial resource strain: Not on file  . Food insecurity - worry: Not on file  . Food insecurity - inability: Not on file  . Transportation needs - medical: Not on file  . Transportation needs - non-medical: Not on file  Occupational History  . Not on file  Tobacco Use  . Smoking status: Never Smoker  . Smokeless tobacco: Never Used  Substance and Sexual Activity  . Alcohol use: Yes    Comment: 1 drink daily  . Drug use: No  . Sexual activity: Yes  Other Topics Concern  . Not on file  Social History Narrative   Retired Furniture conservator/restorer   Enjoys RV, bikes   No children   No pets    Past Surgical History:  Procedure Laterality Date  . CARDIAC CATHETERIZATION    . MYOMECTOMY    . PACEMAKER INSERTION  2004, 12/22/11   initial implant by Dr Olevia Perches, Generator change (MDT Adapta L) by Dr Rayann Heman 6/13  . TOTAL ABDOMINAL HYSTERECTOMY W/ BILATERAL SALPINGOOPHORECTOMY      Family History  Problem Relation Age of Onset  . Heart attack  Mother        died at age 37  . Breast cancer Mother        age 90, had mastectomy.   . Prostate cancer Father     Allergies  Allergen Reactions  . Bee Venom Anaphylaxis  . Codeine Other (See Comments)    REACTION: Passed  out, facial swelling and hives  . Pneumovax 23 [Pneumococcal Vac Polyvalent] Swelling    Local swelling and pain  . Hornet Venom Hives    Hives over entire body  . Sulfonamide Derivatives Hives    Current Outpatient Medications on File Prior to Visit  Medication Sig Dispense Refill  . aspirin EC 81 MG tablet Take 81 mg by mouth daily.    Marland Kitchen atorvastatin (LIPITOR) 80 MG tablet Take 1 tablet (80 mg total) by mouth daily. 90 tablet 3  . Cholecalciferol (VITAMIN D3) 1000 UNITS CAPS Take 2 capsules by mouth daily.    . clobetasol ointment (TEMOVATE) 6.44 % Apply 1 application topically daily as needed (eczema).     . EPINEPHrine 0.3 mg/0.3 mL IJ SOAJ injection Inject 0.3 mLs (0.3 mg total) into the muscle once. 2 Device 1  . levothyroxine (SYNTHROID, LEVOTHROID)  88 MCG tablet TAKE 1 TABLET BY MOUTH ONCE DAILY BEFORE  BREAKFAST 90 tablet 1  . lisinopril (PRINIVIL,ZESTRIL) 20 MG tablet Take 0.5 tablets (10 mg total) by mouth daily. 90 tablet 3  . carvedilol (COREG) 3.125 MG tablet Take 1 tablet (3.125 mg total) by mouth 2 (two) times daily. 60 tablet 3   Current Facility-Administered Medications on File Prior to Visit  Medication Dose Route Frequency Provider Last Rate Last Dose  . triamcinolone acetonide (KENALOG) 10 MG/ML injection 10 mg  10 mg Other Once Stover, Titorya, DPM        BP (!) 161/77 (BP Location: Right Arm, Cuff Size: Normal)   Pulse 64   Temp 98.4 F (36.9 C) (Oral)   Resp 16   Ht 5\' 10"  (1.778 m)   Wt 170 lb 9.6 oz (77.4 kg)   LMP 10/17/1986   SpO2 99%   BMI 24.48 kg/m       Objective:   Physical Exam  Constitutional: She is oriented to person, place, and time. She appears well-developed and well-nourished.  HENT:  Head: Normocephalic  and atraumatic.  Right Ear: Tympanic membrane and ear canal normal.  Left Ear: Tympanic membrane and ear canal normal.  Nose: Right sinus exhibits no maxillary sinus tenderness and no frontal sinus tenderness. Left sinus exhibits no maxillary sinus tenderness and no frontal sinus tenderness.  Mouth/Throat: No oropharyngeal exudate, posterior oropharyngeal edema or posterior oropharyngeal erythema.  Neck: No tracheal deviation present. No thyromegaly present.  Cardiovascular: Normal rate, regular rhythm and normal heart sounds.  No murmur heard. Pulmonary/Chest: Effort normal and breath sounds normal. No respiratory distress. She has no wheezes.  Musculoskeletal: She exhibits no edema.  Neurological: She is alert and oriented to person, place, and time.  Skin: Skin is warm and dry.  Psychiatric: She has a normal mood and affect. Her behavior is normal. Judgment and thought content normal.          Assessment & Plan:  Sinusitis-Symptoms most consistent with sinusitis-  Will treat with Augmentin twice daily for 10 days.  She is instructed to call if new or worsening symptoms or if symptoms fail to improve.

## 2017-05-25 NOTE — Patient Instructions (Signed)
Begin augmentin for sinus infection. Call if symptoms worsen or if not improved in 3-4 days.  Sinusitis, Adult Sinusitis is soreness and inflammation of your sinuses. Sinuses are hollow spaces in the bones around your face. They are located:  Around your eyes.  In the middle of your forehead.  Behind your nose.  In your cheekbones.  Your sinuses and nasal passages are lined with a stringy fluid (mucus). Mucus normally drains out of your sinuses. When your nasal tissues get inflamed or swollen, the mucus can get trapped or blocked so air cannot flow through your sinuses. This lets bacteria, viruses, and funguses grow, and that leads to infection. Follow these instructions at home: Medicines  Take, use, or apply over-the-counter and prescription medicines only as told by your doctor. These may include nasal sprays.  If you were prescribed an antibiotic medicine, take it as told by your doctor. Do not stop taking the antibiotic even if you start to feel better. Hydrate and Humidify  Drink enough water to keep your pee (urine) clear or pale yellow.  Use a cool mist humidifier to keep the humidity level in your home above 50%.  Breathe in steam for 10-15 minutes, 3-4 times a day or as told by your doctor. You can do this in the bathroom while a hot shower is running.  Try not to spend time in cool or dry air. Rest  Rest as much as possible.  Sleep with your head raised (elevated).  Make sure to get enough sleep each night. General instructions  Put a warm, moist washcloth on your face 3-4 times a day or as told by your doctor. This will help with discomfort.  Wash your hands often with soap and water. If there is no soap and water, use hand sanitizer.  Do not smoke. Avoid being around people who are smoking (secondhand smoke).  Keep all follow-up visits as told by your doctor. This is important. Contact a doctor if:  You have a fever.  Your symptoms get worse.  Your  symptoms do not get better within 10 days. Get help right away if:  You have a very bad headache.  You cannot stop throwing up (vomiting).  You have pain or swelling around your face or eyes.  You have trouble seeing.  You feel confused.  Your neck is stiff.  You have trouble breathing. This information is not intended to replace advice given to you by your health care provider. Make sure you discuss any questions you have with your health care provider. Document Released: 12/20/2007 Document Revised: 02/27/2016 Document Reviewed: 04/28/2015 Elsevier Interactive Patient Education  [employer].

## 2017-06-01 ENCOUNTER — Ambulatory Visit (HOSPITAL_COMMUNITY): Payer: Medicare Other | Attending: Cardiology

## 2017-06-01 ENCOUNTER — Other Ambulatory Visit: Payer: Self-pay

## 2017-06-01 ENCOUNTER — Other Ambulatory Visit: Payer: Self-pay | Admitting: Family

## 2017-06-01 ENCOUNTER — Other Ambulatory Visit: Payer: Self-pay | Admitting: Cardiology

## 2017-06-01 DIAGNOSIS — I2510 Atherosclerotic heart disease of native coronary artery without angina pectoris: Secondary | ICD-10-CM | POA: Diagnosis not present

## 2017-06-01 DIAGNOSIS — E785 Hyperlipidemia, unspecified: Secondary | ICD-10-CM

## 2017-06-01 DIAGNOSIS — R29898 Other symptoms and signs involving the musculoskeletal system: Secondary | ICD-10-CM | POA: Insufficient documentation

## 2017-06-01 DIAGNOSIS — I255 Ischemic cardiomyopathy: Secondary | ICD-10-CM | POA: Diagnosis not present

## 2017-06-01 DIAGNOSIS — Z8249 Family history of ischemic heart disease and other diseases of the circulatory system: Secondary | ICD-10-CM | POA: Insufficient documentation

## 2017-06-01 DIAGNOSIS — R0600 Dyspnea, unspecified: Secondary | ICD-10-CM | POA: Insufficient documentation

## 2017-06-01 DIAGNOSIS — I351 Nonrheumatic aortic (valve) insufficiency: Secondary | ICD-10-CM | POA: Insufficient documentation

## 2017-06-04 ENCOUNTER — Telehealth: Payer: Self-pay | Admitting: Cardiology

## 2017-06-04 ENCOUNTER — Telehealth: Payer: Self-pay | Admitting: Family

## 2017-06-04 MED ORDER — LEVOTHYROXINE SODIUM 88 MCG PO TABS: ORAL_TABLET | ORAL | 1 refills | Status: DC

## 2017-06-04 NOTE — Telephone Encounter (Signed)
New message    Patient calling for results of echo. Please call

## 2017-06-04 NOTE — Telephone Encounter (Signed)
Left message of preliminary results for pt

## 2017-06-04 NOTE — Telephone Encounter (Signed)
Refill sent. Left detailed message on pt's voicemail and to call if any questions.

## 2017-06-04 NOTE — Telephone Encounter (Signed)
Relation to HM:CNOB Call back [phone] Pharmacy: [place], [place] [phone] (Phone) [phone] (Fax)     Reason for call:   Patient currently at [facility] and would like if NP can refill levothyroxine (SYNTHROID, LEVOTHROID) 88 MCG tablet

## 2017-06-14 ENCOUNTER — Telehealth: Payer: Self-pay | Admitting: Family

## 2017-06-14 DIAGNOSIS — E785 Hyperlipidemia, unspecified: Secondary | ICD-10-CM

## 2017-06-14 DIAGNOSIS — E039 Hypothyroidism, unspecified: Secondary | ICD-10-CM

## 2017-06-14 NOTE — Telephone Encounter (Signed)
Copied from [place]. Topic: Quick Communication - See Telephone Encounter >> Jun 14, 2017  2:29 PM Hewitt Shorts wrote: CRM for notification. See Telephone encounter for:  Pt is needing to talk with someone regarding her lab work she is going out of town from January thru April   Best number [phone] 06/14/17.

## 2017-06-15 ENCOUNTER — Ambulatory Visit: Payer: Medicare Other | Admitting: Family

## 2017-06-15 NOTE — Telephone Encounter (Signed)
Notified pt. Lab appt scheduled for 07/02/17.

## 2017-06-15 NOTE — Addendum Note (Signed)
Addended by: Kelle Darting A on: 06/15/2017 03:09 PM   Modules accepted: Orders

## 2017-06-15 NOTE — Telephone Encounter (Signed)
Patien will like to know if she needs to come in for labs next month. She is going to [facility] from January to April. Please advise.

## 2017-06-15 NOTE — Telephone Encounter (Signed)
Yes please, I have placed lab orders.

## 2017-07-02 ENCOUNTER — Other Ambulatory Visit: Payer: Medicare Other

## 2017-07-02 ENCOUNTER — Telehealth: Payer: Self-pay | Admitting: *Deleted

## 2017-07-02 NOTE — Telephone Encounter (Signed)
Appt on 07/02/17 was for the lab and was r/s for 07/03/17.  Copied from [place] [id number]. Topic: Quick Communication - Appointment Cancellation >> Jul 02, 2017  8:33 AM Clack, Laban Emperor wrote: Patient called to cancel appointment scheduled for 07/02/17. Patient has rescheduled their appointment.    Route to department's PEC pool.

## 2017-07-03 ENCOUNTER — Other Ambulatory Visit (INDEPENDENT_AMBULATORY_CARE_PROVIDER_SITE_OTHER): Payer: Medicare Other

## 2017-07-03 ENCOUNTER — Telehealth: Payer: Self-pay | Admitting: Family

## 2017-07-03 DIAGNOSIS — E039 Hypothyroidism, unspecified: Secondary | ICD-10-CM

## 2017-07-03 DIAGNOSIS — E785 Hyperlipidemia, unspecified: Secondary | ICD-10-CM | POA: Diagnosis not present

## 2017-07-03 LAB — LIPID PANEL
CHOLESTEROL: 114 mg/dL (ref 0–200)
HDL: 42.6 mg/dL (ref >39.00)
LDL Cholesterol: 56 mg/dL (ref 0–99)
NonHDL: 71.6
TRIGLYCERIDES: 78 mg/dL (ref 0.0–149.0)
Total CHOL/HDL Ratio: 3
VLDL: 15.6 mg/dL (ref 0.0–40.0)

## 2017-07-03 LAB — COMPREHENSIVE METABOLIC PANEL
ALBUMIN: 3.9 g/dL (ref 3.5–5.2)
ALT: 26 U/L (ref 0–35)
AST: 20 U/L (ref 0–37)
Alkaline Phosphatase: 57 U/L (ref 39–117)
BUN: 19 mg/dL (ref 6–23)
CALCIUM: 8.4 mg/dL (ref 8.4–10.5)
CHLORIDE: 104 meq/L (ref 96–112)
CO2: 30 meq/L (ref 19–32)
Creatinine, Ser: 0.76 mg/dL (ref 0.40–1.20)
GFR: 79.74 mL/min (ref >60.00)
Glucose, Bld: 94 mg/dL (ref 70–99)
Potassium: 3.6 mEq/L (ref 3.5–5.1)
Sodium: 139 mEq/L (ref 135–145)
TOTAL PROTEIN: 6.7 g/dL (ref 6.0–8.3)
Total Bilirubin: 0.8 mg/dL (ref 0.2–1.2)

## 2017-07-03 LAB — TSH: TSH: 8.75 u[IU]/mL — ABNORMAL HIGH (ref 0.35–4.50)

## 2017-07-03 MED ORDER — LEVOTHYROXINE SODIUM 100 MCG PO TABS: 100.0000 ug | ORAL_TABLET | Freq: Every day | ORAL | 3 refills | Status: DC

## 2017-07-03 NOTE — Telephone Encounter (Signed)
Lab work shows synthroid needs to be increased. Rx sent for 14mcg. Repeat tsh in 6 weeks please.   Cholesterol, kidney function and electrolytes look good.

## 2017-07-04 MED ORDER — CYCLOBENZAPRINE HCL 5 MG PO TABS: 5.0000 mg | ORAL_TABLET | Freq: Three times a day (TID) | ORAL | 0 refills | Status: DC | PRN

## 2017-07-04 NOTE — Telephone Encounter (Signed)
Left detailed message on pt's cell# and to call and let us know how she would like to proceed with lab order.

## 2017-07-04 NOTE — Telephone Encounter (Signed)
See message.

## 2017-07-04 NOTE — Addendum Note (Signed)
Addended by: [name] on: 07/04/2017 04:31 PM   Modules accepted: Orders

## 2017-07-04 NOTE — Telephone Encounter (Signed)
Pt returned called she would like to be advised. Pt says that she received message but will be away around the 6 weeks time. They will return in April. She would like to know how would she go about getting her labs completed?     ALSO, pt and spouse would like to know if [name] could call in a Rx for Cyclobenzapri 5 MG incase on of there back goes out while they are away?    Please advise.

## 2017-07-04 NOTE — Telephone Encounter (Signed)
Left detailed message on pt's voicemail and to call and schedule lab appt in 6 weeks and let us know if she has any questions.

## 2017-07-04 NOTE — Telephone Encounter (Signed)
Please let patient know that we can repeat the lab work as early as 4 weeks.  Alternatively if she would like to have a lot lab work drawn while she is traveling we could fax to a local lab near where she is traveling such as a [facility].  Just let us know what she prefers to do.  I will send a few tablets of cyclobenzaprine to use as needed.

## 2017-08-03 ENCOUNTER — Telehealth: Payer: Self-pay | Admitting: Family

## 2017-08-03 DIAGNOSIS — E039 Hypothyroidism, unspecified: Secondary | ICD-10-CM

## 2017-08-03 NOTE — Telephone Encounter (Signed)
Copied from [place] [id number]. Topic: Quick Communication - See Telephone Encounter >> Aug 03, 2017  3:49 PM Percell Belt A wrote: CRM for notification. See Telephone encounter for:   pt called and found [facility] down in [place] to fax order to recheck her TSH .  She would like this order faxed over and know when she needs to go to recheck  [facility]  Fax number [phone]  08/03/17.

## 2017-08-06 NOTE — Telephone Encounter (Signed)
Order faxed to below number. Left detailed message on voicemail to repeat TSH on 08/14/17 and [facility] should have order and to call if any problems.

## 2017-08-14 ENCOUNTER — Telehealth: Payer: Self-pay | Admitting: Cardiology

## 2017-08-14 ENCOUNTER — Ambulatory Visit (INDEPENDENT_AMBULATORY_CARE_PROVIDER_SITE_OTHER): Payer: Medicare Other | Admitting: *Deleted

## 2017-08-14 DIAGNOSIS — I441 Atrioventricular block, second degree: Secondary | ICD-10-CM | POA: Diagnosis not present

## 2017-08-14 NOTE — Telephone Encounter (Signed)
Spoke with pt and reminded pt of remote transmission that is due today. Pt verbalized understanding.   

## 2017-08-15 NOTE — Progress Notes (Signed)
Remote pacemaker transmission.   

## 2017-08-16 ENCOUNTER — Encounter: Payer: Self-pay | Admitting: Cardiology

## 2017-08-17 LAB — TSH: TSH: 1.68 (ref 0.41–5.90)

## 2017-08-21 ENCOUNTER — Telehealth: Payer: Self-pay | Admitting: Family

## 2017-08-21 ENCOUNTER — Telehealth: Payer: Self-pay | Admitting: Cardiology

## 2017-08-21 NOTE — Telephone Encounter (Signed)
Notified pt and she voices understanding. Still has refills remaining on thyroid RX and will try to get them transferred to a [facility] near her.

## 2017-08-21 NOTE — Telephone Encounter (Signed)
New message   Pt c/o medication issue:  1. Name of Medication: lisinopril (PRINIVIL,ZESTRIL) 10 MG tablet  2. How are you currently taking this medication (dosage and times per day)? TAKE ONE TABLET BY MOUTH ONCE DAILY  3. Are you having a reaction (difficulty breathing--STAT)? yes  4. What is your medication issue? Pt says the meds are making her cough really bad and she will be in [place] until April Please calll

## 2017-08-21 NOTE — Telephone Encounter (Signed)
Reviewed lab test.  TSH is normal.  She should continue current dose of Synthroid.  Okay to send refills if she needs it.

## 2017-08-21 NOTE — Telephone Encounter (Signed)
Please see result in PCP red folder and advise?

## 2017-08-21 NOTE — Telephone Encounter (Signed)
Copied from [place]. Topic: Quick Communication - See Telephone Encounter >> Aug 21, 2017 12:13 PM Bea Graff, NT wrote: CRM for notification. See Telephone encounter for: PT calling and wanted to make sure the office received a fax regarding lab work she had drawn to check her thyroid levels in [place] to make sure her new dosage of  levothyroxine (SYNTHROID, [place]) was now working. Please advise  08/21/17.

## 2017-08-21 NOTE — Telephone Encounter (Signed)
Returned call to patient of Dr. Percival Spanish. She reports a cough for 2 weeks. She states this is a deep cough, like congestion, like she has to clear her throat. Her husband was previously on lisinopril and it caused him a cough. Denies SOB, weight gain, swelling. Her BP was 140/80 today (she did not take lisinopril for 2-3 days).  She was Rx'ed lisinopril by her PCP in 04/2016. Asked that she notify her PCP as well  Routed to CVRR + MD to review for SE/suggest med change if needed  Patient[place] [address]

## 2017-08-22 NOTE — Telephone Encounter (Signed)
Agree 

## 2017-08-22 NOTE — Telephone Encounter (Signed)
Cough related to lisinopril is usually a dry cough but may be an adverse effect of the medication.  Looks like her lisinopril was prescribed by PCP. Will need to discusses change in therapy with prescribing provider.

## 2017-08-23 NOTE — Telephone Encounter (Signed)
Left detailed message with MD/RPH recommendations on name-verified VM

## 2017-08-27 ENCOUNTER — Telehealth: Payer: Self-pay | Admitting: Family

## 2017-08-27 NOTE — Telephone Encounter (Signed)
Copied from [place]. Topic: Quick Communication - See Telephone Encounter >> Aug 27, 2017 11:46 AM Percell Belt A wrote: CRM for notification. See Telephone encounter for: pt called in and she is in [facility].  She is has started having a bad cough and she thinks it is related to the lisinopril (PRINIVIL,ZESTRIL) 10 MG tablet [[id number].  She would like a call back from [facility] and talk about this med   Best number  [phone]   08/27/17.

## 2017-08-27 NOTE — Telephone Encounter (Signed)
Spoke with pt's spouse. Reports pt has had a hacking, dry cough x 3 weeks. Denies fever or congestion. Coughs at night as well. Pt stopped lisinopril x 1week and cough was significantly diminshed. Had to restart it due to elevated BP. She talked to cardiologist but was told to contact PCP re: BP medication.  If medication change is appropriate, pt would like Rx to go to [facility] in [place], [place] and I have added pharmacy to the contact list.

## 2017-08-27 NOTE — Telephone Encounter (Signed)
Notified pt and she voices understanding. Med and allergy list updated.

## 2017-08-27 NOTE — Telephone Encounter (Signed)
Looks like sbp was 140 off of lisinopril per cardiology note. That BP is ok for her age. Recommend that she stop lisinopril, continue to monitor blood pressure several times a week- if bp <150/90 that is OK. Call me if BP >150/90.

## 2017-08-28 ENCOUNTER — Telehealth: Payer: Self-pay | Admitting: *Deleted

## 2017-08-28 NOTE — Telephone Encounter (Signed)
Received [facility] Report results from [facility]; forwarded to provider/SLS 02/12

## 2017-08-29 LAB — CUP PACEART REMOTE DEVICE CHECK
Battery Remaining Longevity: 88 mo
Battery Voltage: 2.79 V
Brady Statistic AS VS Percent: 0 %
Date Time Interrogation Session: 20190129191052
Implantable Lead Implant Date: 20040907
Implantable Lead Implant Date: 20040907
Lead Channel Impedance Value: 558 Ohm
Lead Channel Pacing Threshold Amplitude: 0.875 V
Lead Channel Pacing Threshold Pulse Width: 0.4 ms
Lead Channel Setting Pacing Amplitude: 2 V
Lead Channel Setting Sensing Sensitivity: 2 mV
MDC IDC LEAD LOCATION: 753859
MDC IDC LEAD LOCATION: 753860
MDC IDC MSMT BATTERY IMPEDANCE: 452 Ohm
MDC IDC MSMT LEADCHNL RA IMPEDANCE VALUE: 535 Ohm
MDC IDC MSMT LEADCHNL RA PACING THRESHOLD AMPLITUDE: 0.5 V
MDC IDC MSMT LEADCHNL RA PACING THRESHOLD PULSEWIDTH: 0.4 ms
MDC IDC PG IMPLANT DT: 20130607
MDC IDC SET LEADCHNL RV PACING AMPLITUDE: 2.5 V
MDC IDC SET LEADCHNL RV PACING PULSEWIDTH: 0.4 ms
MDC IDC STAT BRADY AP VP PERCENT: 9 %
MDC IDC STAT BRADY AP VS PERCENT: 0 %
MDC IDC STAT BRADY AS VP PERCENT: 91 %

## 2017-09-06 ENCOUNTER — Ambulatory Visit: Payer: Self-pay

## 2017-09-06 ENCOUNTER — Telehealth: Payer: Self-pay | Admitting: Family

## 2017-09-06 NOTE — Telephone Encounter (Signed)
Left message to return call 

## 2017-09-06 NOTE — Telephone Encounter (Signed)
Pt calling to report her BP's are becoming elevated. Today's BP 153/85 and 158/83. Pt stated that they have trended 126-154/72-89. Pt denies any symptoms of headache, dizziness, blurred vision or spots before her eyes. Pt stated that she was taken off Lisinopril 10 days ago. She was wondering if she needs to be put on a new BP med. Pt stated that if they call Valsartan iin to call [facility] at  [facility]. [address] [phone].  Pt stated if they call anything other than Valsartan to call to [facility] at [facility]. Ph [phone]  Reason for Disposition . [1] Systolic BP  >= 021 OR Diastolic >= 90 AND [1] not taking BP medications  Answer Assessment - Initial Assessment Questions 1. BLOOD PRESSURE: "What is the blood pressure?" "Did you take at least two measurements 5 minutes apart?"     153/85 and 158/83 2. ONSET: "When did you take your blood pressure?"     4:30 pm 3. HOW: "How did you obtain the blood pressure?" (e.g., visiting nurse, automatic home BP monitor)    Automatic home BP machine 4. HISTORY: "Do you have a history of high blood pressure?"     yes 5. MEDICATIONS: "Are you taking any medications for blood pressure?" "Have you missed any doses recently?"     Stopped taking Lisinopril pt thinks it was  08/28/17  6. OTHER SYMPTOMS: "Do you have any symptoms?" (e.g., headache, chest pain, blurred vision, difficulty breathing, weakness)     Broken blood vessel right outer eye 7. PREGNANCY: "Is there any chance you are pregnant?" "When was your last menstrual period?"     n/a  Protocols used: HIGH BLOOD PRESSURE-A-AH

## 2017-09-06 NOTE — Telephone Encounter (Signed)
Copied from [place] [id number]. Topic: Quick Communication - See Telephone Encounter >> Sep 06, 2017  9:24 AM Aurelio Brash B wrote: CRM for notification. See Telephone encounter for:  Pt states she was taken off her blood pressure medication (lisinopril) and now her blood pressure is running high  (152/89 and 154/81 this morning)  She is requesting a call from [place] [phone]  09/06/17.

## 2017-09-07 MED ORDER — AMLODIPINE BESYLATE 2.5 MG PO TABS: 2.5000 mg | ORAL_TABLET | Freq: Every day | ORAL | 3 refills | Status: DC

## 2017-09-07 NOTE — Telephone Encounter (Signed)
See additional triage note from 09/06/17.

## 2017-09-07 NOTE — Addendum Note (Signed)
Addended by: [name] on: 09/07/2017 04:44 PM   Modules accepted: Orders

## 2017-09-07 NOTE — Telephone Encounter (Signed)
[name]-- please see below note and 2/11 phone encounter regarding this.  Please advise?

## 2017-09-07 NOTE — Telephone Encounter (Signed)
Continue off of lisinopril. Add amlodipine 2.5mg  once daily. [facility] to [facility] [place] that she requests. Call me in 1 week with bp readings.

## 2017-09-07 NOTE — Telephone Encounter (Signed)
Left message to return call 

## 2017-09-11 ENCOUNTER — Other Ambulatory Visit: Payer: Self-pay | Admitting: Family

## 2017-09-11 NOTE — Telephone Encounter (Signed)
Spoke with re: below recommendation. Rx did not get sent in as thought below. Pt reports that her BP readings over the last week have been normal and she has not been on any BP medications.  09/11/17  125/70 09/10/17  138/76 09/09/17  111/64 09/08/17  134/71  Advised pt to continue off BP meds, do not start amlodipine until further notice. If she has any further elevated readings she will take her machine to the medical personnel at the [place] [facility] they are staying with and will have them verify machine accuracy. If elevated reading is confirmed, she will let us know. If Rx is still needed to be sent, [facility] would [facility] in [place], [place]. [address]

## 2017-09-11 NOTE — Telephone Encounter (Signed)
Notified pt and she voices understanding. 

## 2017-09-11 NOTE — Telephone Encounter (Signed)
Ok to remain of bp meds and call if consistent readings >150/90.

## 2017-09-27 ENCOUNTER — Telehealth: Payer: Self-pay | Admitting: *Deleted

## 2017-09-27 NOTE — Telephone Encounter (Signed)
Received request for Medical Records from [facility]; forwarded to [place] for email/scan/SLS 03/14

## 2017-10-29 ENCOUNTER — Telehealth: Payer: Self-pay | Admitting: Family

## 2017-10-29 ENCOUNTER — Other Ambulatory Visit: Payer: Self-pay | Admitting: *Deleted

## 2017-10-29 LAB — HM MAMMOGRAPHY

## 2017-10-29 MED ORDER — LEVOTHYROXINE SODIUM 100 MCG PO TABS: 100.0000 ug | ORAL_TABLET | Freq: Every day | ORAL | 3 refills | Status: DC

## 2017-10-29 NOTE — Telephone Encounter (Signed)
Rx refilled per protocol- last lab normal- 08/17/17

## 2017-10-29 NOTE — Telephone Encounter (Signed)
Copied from [place] [id number]. Topic: Quick Communication - Rx Refill/Question >> Oct 29, 2017 11:40 AM Cleaster Corin, NT wrote: Medication: levothyroxine (SYNTHROID, LEVOTHROID) 100 MCG tablet [[id number]]  Has the patient contacted their pharmacy? yes (Agent: If no, request that the patient contact the pharmacy for the refill.) Preferred Pharmacy (with phone number or street name): [facility] [address], [place] [address] Phone: [phone] Fax: [phone]   Agent: Please be advised that RX refills may take up to 3 business days. We ask that you follow-up with your pharmacy.

## 2017-11-13 ENCOUNTER — Ambulatory Visit (INDEPENDENT_AMBULATORY_CARE_PROVIDER_SITE_OTHER): Payer: Medicare Other | Admitting: *Deleted

## 2017-11-13 DIAGNOSIS — I441 Atrioventricular block, second degree: Secondary | ICD-10-CM

## 2017-11-13 NOTE — Progress Notes (Signed)
Remote pacemaker transmission.   

## 2017-11-14 ENCOUNTER — Encounter: Payer: Self-pay | Admitting: Cardiology

## 2017-11-14 LAB — CUP PACEART REMOTE DEVICE CHECK
Battery Impedance: 553 Ohm
Battery Voltage: 2.78 V
Brady Statistic AP VP Percent: 9 %
Brady Statistic AP VS Percent: 0 %
Brady Statistic AS VP Percent: 91 %
Implantable Lead Implant Date: 20040907
Implantable Lead Location: 753859
Implantable Lead Location: 753860
Implantable Lead Model: 5076
Implantable Lead Model: 5076
Implantable Pulse Generator Implant Date: 20130607
Lead Channel Impedance Value: 437 Ohm
Lead Channel Pacing Threshold Amplitude: 0.5 V
Lead Channel Pacing Threshold Pulse Width: 0.4 ms
Lead Channel Pacing Threshold Pulse Width: 0.4 ms
Lead Channel Setting Pacing Amplitude: 2.5 V
Lead Channel Setting Pacing Pulse Width: 0.4 ms
MDC IDC LEAD IMPLANT DT: 20040907
MDC IDC MSMT BATTERY REMAINING LONGEVITY: 83 mo
MDC IDC MSMT LEADCHNL RV IMPEDANCE VALUE: 655 Ohm
MDC IDC MSMT LEADCHNL RV PACING THRESHOLD AMPLITUDE: 0.75 V
MDC IDC SESS DTM: 20190430124937
MDC IDC SET LEADCHNL RA PACING AMPLITUDE: 2 V
MDC IDC SET LEADCHNL RV SENSING SENSITIVITY: 2 mV
MDC IDC STAT BRADY AS VS PERCENT: 0 %

## 2017-11-19 ENCOUNTER — Ambulatory Visit (INDEPENDENT_AMBULATORY_CARE_PROVIDER_SITE_OTHER): Payer: Medicare Other

## 2017-11-19 ENCOUNTER — Ambulatory Visit (INDEPENDENT_AMBULATORY_CARE_PROVIDER_SITE_OTHER): Payer: Medicare Other | Admitting: Orthopedic Surgery

## 2017-11-19 ENCOUNTER — Encounter (INDEPENDENT_AMBULATORY_CARE_PROVIDER_SITE_OTHER): Payer: Self-pay | Admitting: Orthopedic Surgery

## 2017-11-19 DIAGNOSIS — G8929 Other chronic pain: Secondary | ICD-10-CM | POA: Diagnosis not present

## 2017-11-19 DIAGNOSIS — M5441 Lumbago with sciatica, right side: Secondary | ICD-10-CM | POA: Diagnosis not present

## 2017-11-19 MED ORDER — PREDNISONE 10 MG PO TABS: 20.0000 mg | ORAL_TABLET | Freq: Every day | ORAL | 0 refills | Status: DC

## 2017-11-19 NOTE — Progress Notes (Signed)
Office Visit Note   Patient: Madeline Schaefer           Date of Birth: 1945-12-12           MRN: [id number] Visit Date: 11/19/2017              Requested by: [name], NP [address] [place], [place] [address] PCP: [name], NP  Chief Complaint  Patient presents with  . Right Leg - Pain      HPI: Patient is a 72 year old woman who states she has had about a 72-month history of right-sided radicular pain down the lateral aspect of the right thigh right calf and down to the lateral aspect of the right foot.  She states that this is a sharp burning pain worse at night or with prolonged sitting.  She has tried ice heat ibuprofen.  Past medical history is updated patient does have a pacemaker.  She would not be a candidate for an MRI scan.  Assessment & Plan: Visit Diagnoses:  1. Chronic right-sided low back pain with right-sided sciatica     Plan: Patient is given a prescription of prednisone she will take 20 mg with breakfast wean down to 10 mg with breakfast as her symptoms improve and then take it every other day.  Evaluate for possible need for CT scan at follow-up.  Follow-Up Instructions: No follow-ups on file.   Ortho Exam  Patient is alert, oriented, no adenopathy, well-dressed, normal affect, normal respiratory effort. Examination patient has a normal gait.  She has a positive straight leg raise on the right she has no focal motor weakness in either lower extremity there is no pain with range of motion of the hip knee or ankle.  Imaging: No results found. No images are attached to the encounter.  Labs: Lab Results  Component Value Date   HGBA1C 6.0 (H) 08/06/2015   HGBA1C 6.1 11/23/2014   HGBA1C 6.3 07/07/2014   ESRSEDRATE 31 (H) 10/12/2008   REPTSTATUS 08/20/2012 FINAL 08/19/2012   CULT  08/19/2012    Multiple bacterial morphotypes present, none predominant. Suggest appropriate recollection if clinically indicated.    Lab  Results  Component Value Date/Time   HGBA1C 6.0 (H) 08/06/2015 09:57 AM   HGBA1C 6.1 11/23/2014 11:37 AM   HGBA1C 6.3 07/07/2014 09:25 AM    There is no height or weight on file to calculate BMI.  Orders:  Orders Placed This Encounter  Procedures  . XR Lumbar Spine 2-3 Views   Meds ordered this encounter  Medications  . predniSONE (DELTASONE) 10 MG tablet    Sig: Take 2 tablets (20 mg total) by mouth daily with breakfast.    Dispense:  60 tablet    Refill:  0     Procedures: No procedures performed  Clinical Data: No additional findings.  ROS:  All other systems negative, except as noted in the HPI. Review of Systems  Objective: Vital Signs: LMP 10/17/1986   Specialty Comments:  No specialty comments available.  PMFS History: Patient Active Problem List   Diagnosis Date Noted  . Dyslipidemia 01/20/2017  . Coronary artery disease involving native coronary artery of native heart with angina pectoris ([place]) 01/20/2017  . Aortic valve regurgitation 12/15/2016  . Ischemic cardiomyopathy 12/15/2016  . Osteopenia 11/16/2014  . Adjustment disorder with mixed anxiety and depressed mood 09/26/2013  . Essential hypertension 08/21/2013  . Special screening for malignant neoplasms, colon 08/19/2013  . Hematuria 11/20/2012  . Hyperglycemia  11/20/2012  . Eczema 11/20/2012  . Routine general medical examination at a health care facility 11/20/2012  . Second degree Mobitz II AV block 01/27/2011  . DYSPNEA 12/15/2009  . PACEMAKER-[employer] 12/01/2008  . Hypothyroidism 08/03/2008  . Hyperlipidemia 08/03/2008  . Coronary atherosclerosis 08/03/2008   Past Medical History:  Diagnosis Date  . Blood transfusion without reported diagnosis   . BRONCHITIS   . CAD (coronary artery disease)    LAD stent 2003 ([facility]).    . Cataract   . DYSPNEA   . HLD (hyperlipidemia)   . Hypothyroidism   . Mobitz type 2 second degree heart block    s/p PPM by Dr Olevia Perches 2004  . PACEMAKER,  PERMANENT     Family History  Problem Relation Age of Onset  . Heart attack Mother        died at age 64  . Breast cancer Mother        age 54, had mastectomy.   . Prostate cancer Father     Past Surgical History:  Procedure Laterality Date  . CARDIAC CATHETERIZATION    . CYSTOSCOPY WITH RETROGRADE PYELOGRAM, URETEROSCOPY AND STENT PLACEMENT  08/19/2012   Procedure: CYSTOSCOPY WITH RETROGRADE PYELOGRAM, URETEROSCOPY AND STENT PLACEMENT;  Surgeon: Molli Hazard, MD;  Location: [facility];  Service: Urology;  Laterality: Right;  . MYOMECTOMY    . PACEMAKER GENERATOR CHANGE N/A 12/22/2011   Procedure: PACEMAKER GENERATOR CHANGE;  Surgeon: Thompson Grayer, MD;  Location: [facility] CATH LAB;  Service: Cardiovascular;  Laterality: N/A;  . PACEMAKER INSERTION  2004, 12/22/11   initial implant by Dr Olevia Perches, Generator change (MDT Adapta L) by Dr Rayann Heman 6/13  . TOTAL ABDOMINAL HYSTERECTOMY W/ BILATERAL SALPINGOOPHORECTOMY     Social History   Occupational History  . Not on file  Tobacco Use  . Smoking status: Never Smoker  . Smokeless tobacco: Never Used  Substance and Sexual Activity  . Alcohol use: Yes    Comment: 1 drink daily  . Drug use: No  . Sexual activity: Yes

## 2017-11-21 NOTE — Progress Notes (Signed)
HPI The patient presents for follow up of CAD.  He has a mildly reduced EF and moderate AI .   She has a distant history of stenting to her LAD. She presents for follow up.  She has had a reduced EF since she was seen previously by Dr. Olevia Perches.  I reviewed an echo from 2011 and the EF was 40 - 45%.  She had mild AI.  Her EF in Nov of last year was 40 - 45% with moderate AI.    She returns for follow up.  She had a cough that she stopped lisinopril and this seems to have gotten better.  She is very anxious as she moves back up from [place] to [place] for the season.  When she was in [place] she was she was not having any palpitations, presyncope or syncope.  Riding bikes and doing well.  She was not having any shortness of breath, PND or orthopnea.  She has not been having any chest pressure, neck or arm discomfort.   Allergies  Allergen Reactions  . Bee Venom Anaphylaxis  . Codeine Other (See Comments)    REACTION: Passed  out, facial swelling and hives  . Pneumovax 23 [Pneumococcal Vac Polyvalent] Swelling    Local swelling and pain  . Lisinopril     COUGH  . Hornet Venom Hives    Hives over entire body  . Sulfonamide Derivatives Hives    Current Outpatient Medications  Medication Sig Dispense Refill  . aspirin EC 81 MG tablet Take 81 mg by mouth daily.    Marland Kitchen atorvastatin (LIPITOR) 80 MG tablet Take 1 tablet (80 mg total) by mouth daily. 90 tablet 3  . carvedilol (COREG) 3.125 MG tablet TAKE 1 TABLET BY MOUTH TWICE DAILY 180 tablet 1  . clobetasol ointment (TEMOVATE) 1.61 % Apply 1 application topically daily as needed (eczema).     . EPINEPHrine 0.3 mg/0.3 mL IJ SOAJ injection Inject 0.3 mLs (0.3 mg total) into the muscle once. 2 Device 1  . levothyroxine (SYNTHROID, LEVOTHROID) 100 MCG tablet Take 1 tablet (100 mcg total) by mouth daily. 30 tablet 3  . predniSONE (DELTASONE) 10 MG tablet Take 2 tablets (20 mg total) by mouth daily with breakfast. 60 tablet 0  . losartan  (COZAAR) 25 MG tablet Take 0.5 tablets (12.5 mg total) by mouth daily. 30 tablet 6   Current Facility-Administered Medications  Medication Dose Route Frequency Provider Last Rate Last Dose  . triamcinolone acetonide (KENALOG) 10 MG/ML injection 10 mg  10 mg Other Once Landis Martins, DPM        Past Medical History:  Diagnosis Date  . Blood transfusion without reported diagnosis   . BRONCHITIS   . CAD (coronary artery disease)    LAD stent 2003 ([facility]).    . Cataract   . DYSPNEA   . HLD (hyperlipidemia)   . Hypothyroidism   . Mobitz type 2 second degree heart block    s/p PPM by Dr Olevia Perches 2004  . PACEMAKER, PERMANENT     Past Surgical History:  Procedure Laterality Date  . CARDIAC CATHETERIZATION    . CYSTOSCOPY WITH RETROGRADE PYELOGRAM, URETEROSCOPY AND STENT PLACEMENT  08/19/2012   Procedure: CYSTOSCOPY WITH RETROGRADE PYELOGRAM, URETEROSCOPY AND STENT PLACEMENT;  Surgeon: Molli Hazard, MD;  Location: [facility];  Service: Urology;  Laterality: Right;  . MYOMECTOMY    . PACEMAKER GENERATOR CHANGE N/A 12/22/2011   Procedure: PACEMAKER GENERATOR CHANGE;  Surgeon: Jeneen Rinks  Allred, MD;  Location: [facility] CATH LAB;  Service: Cardiovascular;  Laterality: N/A;  . PACEMAKER INSERTION  2004, 12/22/11   initial implant by Dr Olevia Perches, Generator change (MDT Adapta L) by Dr Rayann Heman 6/13  . TOTAL ABDOMINAL HYSTERECTOMY W/ BILATERAL SALPINGOOPHORECTOMY      ROS:  As stated in the HPI and negative for all other systems.  PHYSICAL EXAM  BP 136/80   Pulse 60   Ht 5\' 10"  (1.778 m)   Wt 169 lb 6.4 oz (76.8 kg)   LMP 10/17/1986   BMI 24.31 kg/m    GENERAL:  Well appearing NECK:  No jugular venous distention, waveform within normal limits, carotid upstroke brisk and symmetric, no bruits, no thyromegaly LUNGS:  Clear to auscultation bilaterally CHEST:  Unremarkable HEART:  PMI not displaced or sustained,S1 and S2 within normal limits, no S3, no S4, no clicks, no rubs, 2 out of 6  apical systolic murmur, soft fourth left intercostal diastolic murmurABD:  Flat, positive bowel sounds normal in frequency in pitch, no bruits, no rebound, no guarding, no midline pulsatile mass, no hepatomegaly, no splenomegaly EXT:  2 plus pulses throughout, no edema, no cyanosis no clubbing     EKG:  Sinus rhythm with ventricular pacing 100%.  Rate 60   Lab Results  Component Value Date   CHOL 114 07/03/2017   TRIG 78.0 07/03/2017   HDL 42.60 07/03/2017   LDLCALC 56 07/03/2017    ASSESSMENT AND PLAN   CAD:     She has had no high risk symptoms since a low risk perfusion study in July of last year.  She will continue with risk reduction.  STATUS POST PACEMAKER:   She is up to date with follow up with Dr. Rayann Heman.     HTN:   This is being managed in the context of treating his CHF  DYSLIPIDEMIA:     LDL was at target as above.  No change in therapy.   REDUCED EF:   EF was 45% on echo last fall which ws higher than previous.  She did not tolerate the ACE inhibitor so I will start Cozaar but her blood pressures are somewhat labile so I started a very low dose of 12.5 mg.  AI/MR:  There is moderate AI.   I will follow-up echo today.

## 2017-11-22 ENCOUNTER — Telehealth (INDEPENDENT_AMBULATORY_CARE_PROVIDER_SITE_OTHER): Payer: Self-pay | Admitting: Orthopedic Surgery

## 2017-11-22 ENCOUNTER — Ambulatory Visit: Payer: Medicare Other | Admitting: Cardiology

## 2017-11-22 ENCOUNTER — Encounter: Payer: Self-pay | Admitting: Cardiology

## 2017-11-22 VITALS — BP 136/80 | HR 60 | Ht 70.0 in | Wt 169.4 lb

## 2017-11-22 DIAGNOSIS — R011 Cardiac murmur, unspecified: Secondary | ICD-10-CM | POA: Diagnosis not present

## 2017-11-22 DIAGNOSIS — I255 Ischemic cardiomyopathy: Secondary | ICD-10-CM | POA: Diagnosis not present

## 2017-11-22 DIAGNOSIS — I351 Nonrheumatic aortic (valve) insufficiency: Secondary | ICD-10-CM

## 2017-11-22 MED ORDER — LOSARTAN POTASSIUM 25 MG PO TABS: 12.5000 mg | ORAL_TABLET | Freq: Every day | ORAL | 6 refills | Status: DC

## 2017-11-22 NOTE — Telephone Encounter (Signed)
IC and discussed prednisone instructions with her.

## 2017-11-22 NOTE — Patient Instructions (Addendum)
Medication Instructions:  START- Losartan 25 mg take 1/2 tablets(12.5 mg) daily  If you need a refill on your cardiac medications before your next appointment, please call your pharmacy.  Labwork: None Ordered   Testing/Procedures: Your physician has requested that you have an echocardiogram. Echocardiography is a painless test that uses sound waves to create images of your heart. It provides your doctor with information about the size and shape of your heart and how well your heart's chambers and valves are working. This procedure takes approximately one hour. There are no restrictions for this procedure.  Follow-Up: Your physician wants you to follow-up in: 6 Months. You should receive a reminder letter in the mail two months in advance. If you do not receive a letter, please call our office [phone].    Thank you for choosing [facility] at [facility]!!

## 2017-11-22 NOTE — Telephone Encounter (Signed)
Patient left voicemail wanting to speak to someone about a RX she was prescribed by Dr. Sharol Given. CB # [phone]

## 2017-12-05 ENCOUNTER — Encounter: Payer: Self-pay | Admitting: Internal Medicine

## 2017-12-05 ENCOUNTER — Encounter

## 2017-12-05 ENCOUNTER — Ambulatory Visit: Payer: Medicare Other | Admitting: Internal Medicine

## 2017-12-05 VITALS — BP 132/70 | HR 78 | Ht 70.0 in | Wt 170.0 lb

## 2017-12-05 DIAGNOSIS — I471 Supraventricular tachycardia: Secondary | ICD-10-CM

## 2017-12-05 DIAGNOSIS — I441 Atrioventricular block, second degree: Secondary | ICD-10-CM | POA: Diagnosis not present

## 2017-12-05 DIAGNOSIS — I2510 Atherosclerotic heart disease of native coronary artery without angina pectoris: Secondary | ICD-10-CM | POA: Diagnosis not present

## 2017-12-05 DIAGNOSIS — I10 Essential (primary) hypertension: Secondary | ICD-10-CM

## 2017-12-05 DIAGNOSIS — Z950 Presence of cardiac pacemaker: Secondary | ICD-10-CM

## 2017-12-05 DIAGNOSIS — I4719 Other supraventricular tachycardia: Secondary | ICD-10-CM

## 2017-12-05 LAB — CUP PACEART INCLINIC DEVICE CHECK
Battery Impedance: 503 Ohm
Battery Remaining Longevity: 86 mo
Brady Statistic AP VP Percent: 9 %
Brady Statistic AP VS Percent: 0 %
Date Time Interrogation Session: 20190522110554
Implantable Lead Implant Date: 20040907
Implantable Lead Location: 753860
Implantable Lead Model: 5076
Lead Channel Impedance Value: 425 Ohm
Lead Channel Pacing Threshold Amplitude: 0.5 V
Lead Channel Pacing Threshold Amplitude: 0.75 V
Lead Channel Pacing Threshold Pulse Width: 0.4 ms
Lead Channel Pacing Threshold Pulse Width: 0.4 ms
Lead Channel Pacing Threshold Pulse Width: 0.4 ms
Lead Channel Sensing Intrinsic Amplitude: 2 mV
Lead Channel Setting Pacing Amplitude: 2 V
Lead Channel Setting Pacing Pulse Width: 0.4 ms
MDC IDC LEAD IMPLANT DT: 20040907
MDC IDC LEAD LOCATION: 753859
MDC IDC MSMT BATTERY VOLTAGE: 2.78 V
MDC IDC MSMT LEADCHNL RA PACING THRESHOLD AMPLITUDE: 0.5 V
MDC IDC MSMT LEADCHNL RV IMPEDANCE VALUE: 678 Ohm
MDC IDC MSMT LEADCHNL RV PACING THRESHOLD AMPLITUDE: 0.75 V
MDC IDC MSMT LEADCHNL RV PACING THRESHOLD PULSEWIDTH: 0.4 ms
MDC IDC PG IMPLANT DT: 20130607
MDC IDC SET LEADCHNL RV PACING AMPLITUDE: 2.5 V
MDC IDC SET LEADCHNL RV SENSING SENSITIVITY: 2 mV
MDC IDC STAT BRADY AS VP PERCENT: 91 %
MDC IDC STAT BRADY AS VS PERCENT: 0 %

## 2017-12-05 NOTE — Patient Instructions (Addendum)
Medication Instructions:  Your physician recommends that you continue on your current medications as directed. Please refer to the Current Medication list given to you today.  Labwork: None ordered.  Testing/Procedures: None ordered.  Follow-Up: Your physician wants you to follow-up in: 12 months with Chanetta Marshall, NP.   You will receive a reminder letter in the mail two months in advance. If you don't receive a letter, please call our office to schedule the follow-up appointment.  Remote monitoring is used to monitor your Pacemaker from home. This monitoring reduces the number of office visits required to check your device to one time per year. It allows [place] to keep an eye on the functioning of your device to ensure it is working properly. You are scheduled for a device check from home on 02/12/2018. You may send your transmission at any time that day. If you have a wireless device, the transmission will be sent automatically. After your physician reviews your transmission, you will receive a postcard with your next transmission date.  Any Other Special Instructions Will Be Listed Below (If Applicable).  If you need a refill on your cardiac medications before your next appointment, please call your pharmacy.

## 2017-12-05 NOTE — Progress Notes (Signed)
PCP: [name], NP Primary Cardiologist: Dr Percival Spanish Primary EP:  Dr Rayann Heman  Madeline Schaefer is a 72 y.o. female who presents today for routine electrophysiology followup.  Since last being seen in our clinic, the patient reports doing very well.  She remains active.  She has recently had problems with her R leg.  This may be sciatica.  She is following with Dr Sharol Given.  Today, she denies symptoms of palpitations, chest pain, shortness of breath,  lower extremity edema, dizziness, presyncope, or syncope.  The patient is otherwise without complaint today.   Past Medical History:  Diagnosis Date  . Blood transfusion without reported diagnosis   . BRONCHITIS   . CAD (coronary artery disease)    LAD stent 2003 ([facility]).    . Cataract   . DYSPNEA   . HLD (hyperlipidemia)   . Hypothyroidism   . Mobitz type 2 second degree heart block    s/p PPM by Dr Olevia Perches 2004  . PACEMAKER, PERMANENT    Past Surgical History:  Procedure Laterality Date  . CARDIAC CATHETERIZATION    . CYSTOSCOPY WITH RETROGRADE PYELOGRAM, URETEROSCOPY AND STENT PLACEMENT  08/19/2012   Procedure: CYSTOSCOPY WITH RETROGRADE PYELOGRAM, URETEROSCOPY AND STENT PLACEMENT;  Surgeon: Molli Hazard, MD;  Location: [facility];  Service: Urology;  Laterality: Right;  . MYOMECTOMY    . PACEMAKER GENERATOR CHANGE N/A 12/22/2011   Procedure: PACEMAKER GENERATOR CHANGE;  Surgeon: Thompson Grayer, MD;  Location: [facility] CATH LAB;  Service: Cardiovascular;  Laterality: N/A;  . PACEMAKER INSERTION  2004, 12/22/11   initial implant by Dr Olevia Perches, Generator change (MDT Adapta L) by Dr Rayann Heman 6/13  . TOTAL ABDOMINAL HYSTERECTOMY W/ BILATERAL SALPINGOOPHORECTOMY      ROS- all systems are reviewed and negative except as per HPI above  Current Outpatient Medications  Medication Sig Dispense Refill  . aspirin EC 81 MG tablet Take 81 mg by mouth daily.    Marland Kitchen atorvastatin (LIPITOR) 80 MG tablet Take 1 tablet (80 mg total)  by mouth daily. 90 tablet 3  . carvedilol (COREG) 3.125 MG tablet TAKE 1 TABLET BY MOUTH TWICE DAILY 180 tablet 1  . clobetasol ointment (TEMOVATE) 2.58 % Apply 1 application topically daily as needed (eczema).     . EPINEPHrine 0.3 mg/0.3 mL IJ SOAJ injection Inject 0.3 mLs (0.3 mg total) into the muscle once. 2 Device 1  . levothyroxine (SYNTHROID, LEVOTHROID) 100 MCG tablet Take 1 tablet (100 mcg total) by mouth daily. 30 tablet 3  . losartan (COZAAR) 25 MG tablet Take 0.5 tablets (12.5 mg total) by mouth daily. 30 tablet 6  . predniSONE (DELTASONE) 10 MG tablet Take 2 tablets (20 mg total) by mouth daily with breakfast. 60 tablet 0   Current Facility-Administered Medications  Medication Dose Route Frequency Provider Last Rate Last Dose  . triamcinolone acetonide (KENALOG) 10 MG/ML injection 10 mg  10 mg Other Once Landis Martins, DPM        Physical Exam: Vitals:   12/05/17 0958  BP: 132/70  Pulse: 78  SpO2: 99%  Weight: 170 lb (77.1 kg)  Height: 5\' 10"  (1.778 m)    GEN- The patient is well appearing, alert and oriented x 3 today.   Head- normocephalic, atraumatic Eyes-  Sclera clear, conjunctiva pink Ears- hearing intact Oropharynx- clear Lungs- Clear to ausculation bilaterally, normal work of breathing Chest- pacemaker pocket is well healed Heart- Regular rate and rhythm, no murmurs, rubs or gallops, PMI not laterally  displaced GI- soft, NT, ND, + BS Extremities- no clubbing, cyanosis, or edema  Pacemaker interrogation- reviewed in detail today,  See PACEART report   Assessment and Plan:  1. Symptomatic second degree heart block Normal pacemaker function See Pace Art report No changes today V paces 90% Could consider upgrade to CRT if EF falls further, though for now, I think conservative management is best  2. HTN Stable No change required today Recently placed on cozaar by Dr Percival Spanish  3. CAD/ ischemic CM/ moderate AI Echo pending Managed by Dr  Percival Spanish  4. Atach Well controlled (buden < 0.1%, longeset 2 min 13 seconds 08/13/17  [facility] Return to see EP NP in a year  Thompson Grayer MD, [facility] 12/05/2017 10:31 AM

## 2017-12-11 ENCOUNTER — Other Ambulatory Visit: Payer: Self-pay | Admitting: Cardiology

## 2017-12-11 DIAGNOSIS — I255 Ischemic cardiomyopathy: Secondary | ICD-10-CM

## 2017-12-11 MED ORDER — CARVEDILOL 3.125 MG PO TABS: 3.1250 mg | ORAL_TABLET | Freq: Two times a day (BID) | ORAL | 1 refills | Status: DC

## 2017-12-11 NOTE — Telephone Encounter (Signed)
Rx request sent to pharmacy.  

## 2017-12-11 NOTE — Telephone Encounter (Signed)
New Message:        *STAT* If patient is at the pharmacy, call can be transferred to refill team.   1. Which medications need to be refilled? (please list name of each medication and dose if known) carvedilol (COREG) 3.125 MG tablet  2. Which pharmacy/location (including street and city if local pharmacy) is medication to be sent to?[place], [place] [address]  3. Do they need a 30 day or 90 day supply? [place]

## 2017-12-12 ENCOUNTER — Telehealth (INDEPENDENT_AMBULATORY_CARE_PROVIDER_SITE_OTHER): Payer: Self-pay | Admitting: Orthopedic Surgery

## 2017-12-12 NOTE — Telephone Encounter (Signed)
Patient called stating that she has been taking prednisone and would like to stop taking it, how would she go about doing that? CB # [phone]

## 2017-12-12 NOTE — Telephone Encounter (Signed)
Pt was weaning for prednisone 20mg  qd down to 10 mg qod. She now wants to stop taking the medication and wants to know how she can taper off.

## 2017-12-12 NOTE — Telephone Encounter (Signed)
Called and lm on vm to advise of below.

## 2017-12-13 ENCOUNTER — Ambulatory Visit (INDEPENDENT_AMBULATORY_CARE_PROVIDER_SITE_OTHER): Payer: Medicare Other | Admitting: Orthopedic Surgery

## 2017-12-25 ENCOUNTER — Ambulatory Visit (INDEPENDENT_AMBULATORY_CARE_PROVIDER_SITE_OTHER): Payer: Medicare Other | Admitting: Orthopedic Surgery

## 2017-12-26 ENCOUNTER — Ambulatory Visit (INDEPENDENT_AMBULATORY_CARE_PROVIDER_SITE_OTHER): Payer: Medicare Other | Admitting: Orthopedic Surgery

## 2017-12-31 ENCOUNTER — Other Ambulatory Visit: Payer: Self-pay

## 2017-12-31 ENCOUNTER — Ambulatory Visit (HOSPITAL_COMMUNITY): Payer: Medicare Other | Attending: Cardiology

## 2017-12-31 DIAGNOSIS — I351 Nonrheumatic aortic (valve) insufficiency: Secondary | ICD-10-CM

## 2017-12-31 DIAGNOSIS — R011 Cardiac murmur, unspecified: Secondary | ICD-10-CM | POA: Diagnosis not present

## 2017-12-31 DIAGNOSIS — I080 Rheumatic disorders of both mitral and aortic valves: Secondary | ICD-10-CM | POA: Diagnosis not present

## 2017-12-31 DIAGNOSIS — I255 Ischemic cardiomyopathy: Secondary | ICD-10-CM | POA: Insufficient documentation

## 2018-01-01 ENCOUNTER — Telehealth (INDEPENDENT_AMBULATORY_CARE_PROVIDER_SITE_OTHER): Payer: Self-pay | Admitting: Orthopedic Surgery

## 2018-01-01 NOTE — Telephone Encounter (Signed)
Returned call to patient left message to call back to R/S her appointment

## 2018-01-15 ENCOUNTER — Other Ambulatory Visit: Payer: Self-pay | Admitting: Family

## 2018-01-15 ENCOUNTER — Other Ambulatory Visit: Payer: Self-pay

## 2018-01-15 NOTE — Telephone Encounter (Signed)
Patient stressed that if by some chance it was refilled today to PLEASE send to [address] and if tomorrow please send to the [place] at [facility] ( in her chart )

## 2018-01-15 NOTE — Telephone Encounter (Signed)
Copied from [place] [id number]. Topic: Quick Communication - Rx Refill/Question >> Jan 15, 2018  1:01 PM Ahmed Prima L wrote: Medication: atorvastatin (LIPITOR) 80 MG tablet 90 days  Has the patient contacted their pharmacy? yes (Agent: If no, request that the patient contact the pharmacy for the refill.) (Agent: If yes, when and what did the pharmacy advise?) they told her it was out of date  Preferred Pharmacy (with phone number or street name): [place], [address] if it will be filled today and if it will be filled tomorrow it needs to go to [place] in [facility].  Agent: Please be advised that RX refills may take up to 3 business days. We ask that you follow-up with your pharmacy.

## 2018-01-15 NOTE — Telephone Encounter (Signed)
Refill of lipitor by historical provider  LOV 05/25/17 M. Inda Castle  [facility] 12/15/16  #90  3 refills  If sent today please send to: [place], [facility] [place]. If sent  Tomorrow, please sent to [facility] in [facility].

## 2018-01-16 ENCOUNTER — Telehealth: Payer: Self-pay | Admitting: Cardiology

## 2018-01-16 MED ORDER — ATORVASTATIN CALCIUM 80 MG PO TABS: 80.0000 mg | ORAL_TABLET | Freq: Every day | ORAL | 1 refills | Status: DC

## 2018-01-16 NOTE — Telephone Encounter (Signed)
Refill sent.   Lab Results  Component Value Date   CHOL 114 07/03/2017   HDL 42.60 07/03/2017   LDLCALC 56 07/03/2017   TRIG 78.0 07/03/2017   CHOLHDL 3 07/03/2017

## 2018-01-16 NOTE — Telephone Encounter (Signed)
Madeline Schaefer -- last refills came from cardiology in 12/2016.  Ok for [place] to refill?

## 2018-01-16 NOTE — Telephone Encounter (Signed)
New Message   *STAT* If patient is at the pharmacy, call can be transferred to refill team.   1. Which medications need to be refilled? (please list name of each medication and dose if known) atorvastatin (LIPITOR) 80 MG tablet  2. Which pharmacy/location (including street and city if local pharmacy) is medication to be sent to? [facility] [address], [place] [address]  3. Do they need a 30 day or 90 day supply? [place]

## 2018-01-22 ENCOUNTER — Encounter (INDEPENDENT_AMBULATORY_CARE_PROVIDER_SITE_OTHER): Payer: Self-pay | Admitting: Orthopedic Surgery

## 2018-01-22 ENCOUNTER — Ambulatory Visit (INDEPENDENT_AMBULATORY_CARE_PROVIDER_SITE_OTHER): Payer: Medicare Other | Admitting: Orthopedic Surgery

## 2018-01-22 VITALS — Ht 70.0 in | Wt 170.0 lb

## 2018-01-22 DIAGNOSIS — G8929 Other chronic pain: Secondary | ICD-10-CM

## 2018-01-22 DIAGNOSIS — M5441 Lumbago with sciatica, right side: Secondary | ICD-10-CM

## 2018-01-22 NOTE — Progress Notes (Signed)
Office Visit Note   Patient: Madeline Schaefer           Date of Birth: 02-26-1946           MRN: [id number] Visit Date: 01/22/2018              Requested by: [name], NP [address] [place], [place] [address] PCP: [name], NP  Chief Complaint  Patient presents with  . Lower Back - Follow-up      HPI: Patient is a 72 year old woman who still has persistent right-sided lower back pain radiating down the right leg.  Patient states she did take the prednisone 20 mg for about 2 weeks which did provide some relief.  Patient states the pain is worse with prolonged sitting and with prolonged driving.  Assessment & Plan: Visit Diagnoses:  1. Chronic right-sided low back pain with right-sided sciatica     Plan: Discussed that we could proceed with a CT of her lumbar spine she does have a pacemaker in place and cannot do an MRI scan.  Discussed the purpose would be to consider an epidural steroid injection.  Patient states that she will be out of town for the summer but will call us when she is ready to schedule the CT scan of the lumbar spine. Discussed the patient could resume the prednisone at 10 or 20 mg with breakfast and wean off as her symptoms improved.  Follow-Up Instructions: Return if symptoms worsen or fail to improve.   Ortho Exam  Patient is alert, oriented, no adenopathy, well-dressed, normal affect, normal respiratory effort. Examination patient has a negative straight leg raise bilaterally good motor strength in both lower extremities.  Review of her radiographs shows decreased joint space at L5-S1.  No pars defect.  Imaging: No results found. No images are attached to the encounter.  Labs: Lab Results  Component Value Date   HGBA1C 6.0 (H) 08/06/2015   HGBA1C 6.1 11/23/2014   HGBA1C 6.3 07/07/2014   ESRSEDRATE 31 (H) 10/12/2008   REPTSTATUS 08/20/2012 FINAL 08/19/2012   CULT  08/19/2012    Multiple bacterial morphotypes  present, none predominant. Suggest appropriate recollection if clinically indicated.     Lab Results  Component Value Date   ALBUMIN 3.9 07/03/2017   ALBUMIN 4.3 01/12/2017   ALBUMIN 4.3 09/15/2016    Body mass index is 24.39 kg/m.  Orders:  No orders of the defined types were placed in this encounter.  No orders of the defined types were placed in this encounter.    Procedures: No procedures performed  Clinical Data: No additional findings.  ROS:  All other systems negative, except as noted in the HPI. Review of Systems  Objective: Vital Signs: Ht 5\' 10"  (1.778 m)   Wt 170 lb (77.1 kg)   LMP 10/17/1986   BMI 24.39 kg/m   Specialty Comments:  No specialty comments available.  PMFS History: Patient Active Problem List   Diagnosis Date Noted  . Dyslipidemia 01/20/2017  . Coronary artery disease involving native coronary artery of native heart with angina pectoris ([place]) 01/20/2017  . Aortic valve regurgitation 12/15/2016  . Ischemic cardiomyopathy 12/15/2016  . Osteopenia 11/16/2014  . Adjustment disorder with mixed anxiety and depressed mood 09/26/2013  . Essential hypertension 08/21/2013  . Special screening for malignant neoplasms, colon 08/19/2013  . Hematuria 11/20/2012  . Hyperglycemia 11/20/2012  . Eczema 11/20/2012  . Routine general medical examination at a health care facility 11/20/2012  .  Second degree Mobitz II AV block 01/27/2011  . DYSPNEA 12/15/2009  . PACEMAKER-[employer] 12/01/2008  . Hypothyroidism 08/03/2008  . Hyperlipidemia 08/03/2008  . Coronary atherosclerosis 08/03/2008   Past Medical History:  Diagnosis Date  . Blood transfusion without reported diagnosis   . BRONCHITIS   . CAD (coronary artery disease)    LAD stent 2003 ([facility]).    . Cataract   . DYSPNEA   . HLD (hyperlipidemia)   . Hypothyroidism   . Mobitz type 2 second degree heart block    s/p PPM by Dr Olevia Perches 2004  . PACEMAKER, PERMANENT     Family History    Problem Relation Age of Onset  . Heart attack Mother        died at age 68  . Breast cancer Mother        age 29, had mastectomy.   . Prostate cancer Father     Past Surgical History:  Procedure Laterality Date  . CARDIAC CATHETERIZATION    . CYSTOSCOPY WITH RETROGRADE PYELOGRAM, URETEROSCOPY AND STENT PLACEMENT  08/19/2012   Procedure: CYSTOSCOPY WITH RETROGRADE PYELOGRAM, URETEROSCOPY AND STENT PLACEMENT;  Surgeon: Molli Hazard, MD;  Location: [facility];  Service: Urology;  Laterality: Right;  . MYOMECTOMY    . PACEMAKER GENERATOR CHANGE N/A 12/22/2011   Procedure: PACEMAKER GENERATOR CHANGE;  Surgeon: Thompson Grayer, MD;  [facility] CATH LAB;  Service: Cardiovascular;  Laterality: N/A;  . PACEMAKER INSERTION  2004, 12/22/11   initial implant by Dr Olevia Perches, Generator change (MDT Adapta L) by Dr Rayann Heman 6/13  . TOTAL ABDOMINAL HYSTERECTOMY W/ BILATERAL SALPINGOOPHORECTOMY     Social History   Occupational History  . Not on file  Tobacco Use  . Smoking status: Never Smoker  . Smokeless tobacco: Never Used  Substance and Sexual Activity  . Alcohol use: Yes    Comment: 1 drink daily  . Drug use: No  . Sexual activity: Yes

## 2018-01-30 ENCOUNTER — Telehealth: Payer: Self-pay | Admitting: *Deleted

## 2018-01-30 NOTE — Telephone Encounter (Signed)
[facility] [place]; forwarded to [place] for email/scan/SLS 07/17

## 2018-02-12 ENCOUNTER — Telehealth: Payer: Self-pay

## 2018-02-12 ENCOUNTER — Ambulatory Visit (INDEPENDENT_AMBULATORY_CARE_PROVIDER_SITE_OTHER): Payer: Medicare Other | Admitting: *Deleted

## 2018-02-12 DIAGNOSIS — I441 Atrioventricular block, second degree: Secondary | ICD-10-CM | POA: Diagnosis not present

## 2018-02-12 NOTE — Telephone Encounter (Signed)
LMOVM reminding pt to send remote transmission.   

## 2018-02-13 ENCOUNTER — Encounter: Payer: Self-pay | Admitting: Cardiology

## 2018-02-13 NOTE — Progress Notes (Signed)
Remote pacemaker transmission.   

## 2018-02-16 LAB — CUP PACEART REMOTE DEVICE CHECK
Battery Remaining Longevity: 84 mo
Battery Voltage: 2.79 V
Brady Statistic AP VS Percent: 0 %
Brady Statistic AS VS Percent: 0 %
Date Time Interrogation Session: 20190731103126
Implantable Lead Implant Date: 20040907
Implantable Lead Location: 753859
Implantable Lead Model: 5076
Implantable Pulse Generator Implant Date: 20130607
Lead Channel Impedance Value: 727 Ohm
Lead Channel Pacing Threshold Amplitude: 0.625 V
Lead Channel Pacing Threshold Amplitude: 0.75 V
Lead Channel Pacing Threshold Pulse Width: 0.4 ms
Lead Channel Pacing Threshold Pulse Width: 0.4 ms
Lead Channel Sensing Intrinsic Amplitude: 1 mV
Lead Channel Setting Pacing Amplitude: 2.5 V
Lead Channel Setting Pacing Pulse Width: 0.4 ms
MDC IDC LEAD IMPLANT DT: 20040907
MDC IDC LEAD LOCATION: 753860
MDC IDC MSMT BATTERY IMPEDANCE: 553 Ohm
MDC IDC MSMT LEADCHNL RA IMPEDANCE VALUE: 449 Ohm
MDC IDC SET LEADCHNL RA PACING AMPLITUDE: 2 V
MDC IDC SET LEADCHNL RV SENSING SENSITIVITY: 2 mV
MDC IDC STAT BRADY AP VP PERCENT: 14 %
MDC IDC STAT BRADY AS VP PERCENT: 86 %

## 2018-02-25 ENCOUNTER — Ambulatory Visit: Payer: Self-pay

## 2018-02-25 ENCOUNTER — Ambulatory Visit: Payer: Medicare Other | Admitting: Nurse Practitioner

## 2018-02-25 NOTE — Telephone Encounter (Signed)
Noted  

## 2018-02-25 NOTE — Telephone Encounter (Signed)
Pt.'s husband reports pt.ate shrimp last night and woke up "itching all over about 0400 this morning." No rash noted. Took Benadryl, which has helped. Has had reactions to sea food in the past that was relieved with antihistamines. No other symptoms. No rash, shortness of breath or chest pain. No availability at [facility] office. Appointment made at [facility]. Pt. Is currently out of town . Will be traveling home. Instructed to got to ED if she develops rash, shortness of breath or chest pain. Husband verbalizes understanding. Reason for Disposition . [1] MODERATE-SEVERE widespread itching (i.e., interferes with sleep, normal activities or school) AND [2] not improved after 24 hours of itching Care Advice  Answer Assessment - Initial Assessment Questions 1. DESCRIPTION: "Describe the itching you are having."    Itching all over 2. SEVERITY: "How bad is it?"    - MILD - doesn't interfere with normal activities   - MODERATE - SEVERE: interferes with work, school, sleep, or other activities      Moderate 3. SCRATCHING: "Are there any scratch marks? Bleeding?"     No 4. ONSET: "When did this begin?"      0400 This morning 5. CAUSE: "What do you think is causing the itching?" (ask about swimming pools, pollen, animals, soaps, etc.)     Maybe a reaction to shrimp 6. OTHER SYMPTOMS: "Do you have any other symptoms?"      Watery eyes and itchy 7. PREGNANCY: "Is there any chance you are pregnant?" "When was your last menstrual period?"     No  Protocols used: ITCHING [facility]

## 2018-03-01 ENCOUNTER — Telehealth: Payer: Self-pay | Admitting: Family

## 2018-03-01 DIAGNOSIS — T7840XA Allergy, unspecified, initial encounter: Secondary | ICD-10-CM

## 2018-03-01 NOTE — Telephone Encounter (Signed)
Received the following message from patient's husband via his mychart account (see below). Please contact pt and let her know that I have placed allergy referral and make sure she has an epipen on hand in case of recurrent reaction-ok to send refill if needed.   Madeline Schaefer, [name] can't get access to her MyChart.   She asked someone in your office to ask you to give her a referral to an allergist. She had a reaction last weekend when she was in [place], probably caused by eating shrimp.   But she wants to go have allergy test to see what she needs to avoid.    Please let me know if you can refer her to an allergist.   Since she can't get access to her MyChart, you can call her or text her at her cell # [phone].   You probably remember that she had 2 bad reactions to bee stings and a reaction to pneumonia vaccine.

## 2018-03-01 NOTE — Telephone Encounter (Signed)
Patient advised referral was made, she reports she has an epipen.

## 2018-03-13 ENCOUNTER — Telehealth: Payer: Self-pay | Admitting: Family

## 2018-03-13 MED ORDER — LEVOTHYROXINE SODIUM 100 MCG PO TABS: 100.0000 ug | ORAL_TABLET | Freq: Every day | ORAL | 0 refills | Status: DC

## 2018-03-13 NOTE — Telephone Encounter (Signed)
Copied from [place] [id number]. Topic: Quick Communication - See Telephone Encounter >> Mar 13, 2018 10:51 AM Conception Chancy, NT wrote: CRM for notification. See Telephone encounter for: 03/13/18.  Patient is calling and requesting a refill on levothyroxine (SYNTHROID, LEVOTHROID) 100 MCG tablet. She would like a 3 month supply. Please advise.  [facility] [address] [place] [address] Phone: [phone] Fax: [phone]

## 2018-03-19 ENCOUNTER — Encounter: Payer: Self-pay | Admitting: Family

## 2018-03-21 ENCOUNTER — Ambulatory Visit: Payer: Medicare Other | Admitting: Nurse Practitioner

## 2018-03-21 ENCOUNTER — Encounter: Payer: Self-pay | Admitting: Nurse Practitioner

## 2018-03-21 VITALS — BP 126/70 | HR 71 | Temp 98.3°F | Ht 70.0 in | Wt 168.0 lb

## 2018-03-21 DIAGNOSIS — M26621 Arthralgia of right temporomandibular joint: Secondary | ICD-10-CM | POA: Diagnosis not present

## 2018-03-21 MED ORDER — IBUPROFEN 200 MG PO TABS: 400.0000 mg | ORAL_TABLET | Freq: Three times a day (TID) | ORAL | 0 refills | Status: AC | PRN

## 2018-03-21 NOTE — Progress Notes (Signed)
Subjective:  Patient ID: Madeline Schaefer, female    DOB: 1946-04-18  Age: 72 y.o. MRN: [id number]  CC: Ear Pain (right ear painful,sore to touch going down toward the jaw line. this going last night, pt took ibuprofen.)   Otalgia   There is pain in the right ear. This is a new problem. The current episode started yesterday. The problem has been gradually improving. There has been no fever. Pertinent negatives include no coughing, ear discharge, headaches, hearing loss, neck pain, rash or sore throat. She has tried NSAIDs for the symptoms. The treatment provided significant relief. There is no history of hearing loss.  radiates to jaw.  Reviewed past Medical, Social and Family history today.  Outpatient Medications Prior to Visit  Medication Sig Dispense Refill  . aspirin EC 81 MG tablet Take 81 mg by mouth daily.    Marland Kitchen atorvastatin (LIPITOR) 80 MG tablet Take 1 tablet (80 mg total) by mouth daily. 90 tablet 1  . carvedilol (COREG) 3.125 MG tablet Take 1 tablet (3.125 mg total) by mouth 2 (two) times daily. 180 tablet 1  . clobetasol ointment (TEMOVATE) 7.37 % Apply 1 application topically daily as needed (eczema).     . EPINEPHrine 0.3 mg/0.3 mL IJ SOAJ injection Inject 0.3 mLs (0.3 mg total) into the muscle once. 2 Device 1  . levothyroxine (SYNTHROID, LEVOTHROID) 100 MCG tablet Take 1 tablet (100 mcg total) by mouth daily. 30 tablet 0  . losartan (COZAAR) 25 MG tablet Take 25 mg by mouth daily.    Marland Kitchen losartan (COZAAR) 25 MG tablet Take 0.5 tablets (12.5 mg total) by mouth daily. 30 tablet 6  . predniSONE (DELTASONE) 10 MG tablet Take 2 tablets (20 mg total) by mouth daily with breakfast. (Patient not taking: Reported on 03/21/2018) 60 tablet 0   Facility-Administered Medications Prior to Visit  Medication Dose Route Frequency Provider Last Rate Last Dose  . triamcinolone acetonide (KENALOG) 10 MG/ML injection 10 mg  10 mg Other Once Landis Martins, DPM        ROS See HPI  Objective:   BP 126/70   Pulse 71   Temp 98.3 F (36.8 C) (Oral)   Ht 5\' 10"  (1.778 m)   Wt 168 lb (76.2 kg)   LMP 10/17/1986   SpO2 99%   BMI 24.11 kg/m   BP Readings from Last 3 Encounters:  03/21/18 126/70  12/05/17 132/70  11/22/17 136/80    Wt Readings from Last 3 Encounters:  03/21/18 168 lb (76.2 kg)  01/22/18 170 lb (77.1 kg)  12/05/17 170 lb (77.1 kg)    Physical Exam  Constitutional: She appears well-developed and well-nourished.  HENT:  Right Ear: Tympanic membrane, external ear and ear canal normal. No mastoid tenderness.  Left Ear: Tympanic membrane, external ear and ear canal normal. No mastoid tenderness.  Nose: Nose normal. Right sinus exhibits no maxillary sinus tenderness and no frontal sinus tenderness. Left sinus exhibits no maxillary sinus tenderness and no frontal sinus tenderness.  Mouth/Throat: Oropharynx is clear and moist and mucous membranes are normal. No oral lesions. No trismus in the jaw. Normal dentition. No dental abscesses. No oropharyngeal exudate.  Right TMJ tenderness. Negative trimus  Eyes: Pupils are equal, round, and reactive to light. Conjunctivae and EOM are normal. No scleral icterus.  Neck: Normal range of motion. Neck supple.  Lymphadenopathy:    She has no cervical adenopathy.  Vitals reviewed. negative submental lymphadenopathy.  Lab Results  Component Value Date  WBC 7.1 08/19/2012   HGB 14.2 08/19/2012   HCT 42.4 08/19/2012   PLT 217 08/19/2012   GLUCOSE 94 07/03/2017   CHOL 114 07/03/2017   TRIG 78.0 07/03/2017   HDL 42.60 07/03/2017   LDLCALC 56 07/03/2017   ALT 26 07/03/2017   AST 20 07/03/2017   NA 139 07/03/2017   K 3.6 07/03/2017   CL 104 07/03/2017   CREATININE 0.76 07/03/2017   BUN 19 07/03/2017   CO2 30 07/03/2017   TSH 1.68 08/17/2017   INR 1.00 12/22/2011   HGBA1C 6.0 (H) 08/06/2015    Assessment & Plan:   Madeline Schaefer was seen today for ear pain.  Diagnoses and all orders for this visit:  Arthralgia of  right temporomandibular joint -     ibuprofen (ADVIL) 200 MG tablet; Take 2 tablets (400 mg total) by mouth every 8 (eight) hours as needed for moderate pain (with food).   I am having Madeline Schaefer start on ibuprofen. I am also having her maintain her aspirin EC, clobetasol ointment, EPINEPHrine, predniSONE, losartan, carvedilol, atorvastatin, levothyroxine, and losartan. We will continue to administer triamcinolone acetonide.  Meds ordered this encounter  Medications  . ibuprofen (ADVIL) 200 MG tablet    Sig: Take 2 tablets (400 mg total) by mouth every 8 (eight) hours as needed for moderate pain (with food).    Dispense:  30 tablet    Refill:  0    Order Specific Question:   Supervising Provider    Answer:   Lucille Passy [[id number]]    Follow-up: No follow-ups on file.  Madeline Lacy, NP

## 2018-03-21 NOTE — Patient Instructions (Signed)
Eat a soft diet if you are having trouble chewing.  Apply ice to the painful area. ? Put ice in a plastic bag. ? Place a towel between your skin and the bag. ? Leave the ice on for 20 minutes, 2-3 times a day.  Apply a warm compress to the painful area as directed.  Massage your jaw area and perform any jaw stretching exercises as recommended by your health care provider.  If you were given a mouthpiece or bite plate, wear it as directed.  Avoid foods that require a lot of chewing. Do not chew gum.  Temporomandibular Joint Syndrome Temporomandibular joint (TMJ) syndrome is a condition that affects the joints between your jaw and your skull. The TMJs are located near your ears and allow your jaw to open and close. These joints and the nearby muscles are involved in all movements of the jaw. People with TMJ syndrome have pain in the area of these joints and muscles. Chewing, biting, or other movements of the jaw can be difficult or painful. TMJ syndrome can be caused by various things. In many cases, the condition is mild and goes away within a few weeks. For some people, the condition can become a long-term problem. What are the causes? Possible causes of TMJ syndrome include:  Grinding your teeth or clenching your jaw. Some people do this when they are under stress.  Arthritis.  Injury to the jaw.  Head or neck injury.  Teeth or dentures that are not aligned well.  In some cases, the cause of TMJ syndrome may not be known. What are the signs or symptoms? The most common symptom is an aching pain on the side of the head in the area of the TMJ. Other symptoms may include:  Pain when moving your jaw, such as when chewing or biting.  Being unable to open your jaw all the way.  Making a clicking sound when you open your mouth.  Headache.  Earache.  Neck or shoulder pain.  How is this diagnosed? Diagnosis can usually be made based on your symptoms, your medical history,  and a physical exam. Your health care provider may check the range of motion of your jaw. Imaging tests, such as X-rays or an MRI, are sometimes done. You may need to see your dentist to determine if your teeth and jaw are lined up correctly. How is this treated? TMJ syndrome often goes away on its own. If treatment is needed, the options may include:  Eating soft foods and applying ice or heat.  Medicines to relieve pain or inflammation.  Medicines to relax the muscles.  A splint, bite plate, or mouthpiece to prevent teeth grinding or jaw clenching.  Relaxation techniques or counseling to help reduce stress.  Transcutaneous electrical nerve stimulation (TENS). This helps to relieve pain by applying an electrical current through the skin.  Acupuncture. This is sometimes helpful to relieve pain.  Jaw surgery. This is rarely needed.  Follow these instructions at home:  Take medicines only as directed by your health care provider.  Eat a soft diet if you are having trouble chewing.  Apply ice to the painful area. ? Put ice in a plastic bag. ? Place a towel between your skin and the bag. ? Leave the ice on for 20 minutes, 2-3 times a day.  Apply a warm compress to the painful area as directed.  Massage your jaw area and perform any jaw stretching exercises as recommended by your health care  provider.  If you were given a mouthpiece or bite plate, wear it as directed.  Avoid foods that require a lot of chewing. Do not chew gum.  Keep all follow-up visits as directed by your health care provider. This is important. Contact a health care provider if:  You are having trouble eating.  You have new or worsening symptoms. Get help right away if:  Your jaw locks open or closed. This information is not intended to replace advice given to you by your health care provider. Make sure you discuss any questions you have with your health care provider. Document Released: 03/28/2001  Document Revised: 03/02/2016 Document Reviewed: 02/05/2014 Elsevier Interactive Patient Education  2018 [place].   Jaw Range of Motion Exercises Jaw range of motion exercises are exercises that help your jaw to move better. These exercises can help to prevent:  Difficulty opening your mouth.  Pain in your jaw while it is both open and closed.  What should I be careful of when doing jaw exercises? Make sure that you only do jaw exercises as directed by your health care provider. You should only move your jaw as far as it can go in each direction, if told to do so by your health care provider. Do not move your jaw into positions that cause you any pain. What exercises should I do?  Stick your jaw forward. Hold this position for 1-2 seconds. Allow your jaw to return to its normal position and rest it there for 1-2 seconds. Do this exercise 8 times.  Stand or sit in front of a mirror. Place your tongue on the roof of your mouth, just behind your top teeth. Slowly open and close your jaw, keeping your tongue on the roof of your mouth. While you open and close your mouth, try to keep your jaw from moving toward one side or the other. Repeat this 8 times.  Move your jaw right. Hold this position for 1-2 seconds. Allow your jaw to return to its normal position, and rest it there for 1-2 seconds. Do this exercise 8 times.  Move your jaw left. Hold this position for 1-2 seconds. Allow your jaw to return to its normal position, and rest it there for 1-2 seconds. Do this exercise 8 times.  Open your mouth as far as it is can comfortably go. Hold this position for 1-2 seconds. Then close your mouth and rest for 1-2 seconds. Do this exercise 8 times.  Move your jaw in a circular motion, starting toward the right (clockwise). Repeat this 8 times.  Move your jaw in a circular motion, starting toward the left (counterclockwise). Repeat this 8 times. Apply moist heat packs or ice packs to your jaw  before or after performing your exercises as directed by your health care provider. What else can I do? Avoid the following, if they cause jaw pain or they increase your jaw pain:  Chewing gum.  Clenching your jaw or teeth or keeping tension in your jaw muscles.  Leaning on your jaw, such as resting your jaw in your hand while leaning on a desk.  This information is not intended to replace advice given to you by your health care provider. Make sure you discuss any questions you have with your health care provider. Document Released: 06/15/2008 Document Revised: 12/09/2015 Document Reviewed: 06/03/2014 Elsevier Interactive Patient Education  [employer].

## 2018-03-22 ENCOUNTER — Encounter: Payer: Self-pay | Admitting: Family

## 2018-03-23 ENCOUNTER — Encounter: Payer: Self-pay | Admitting: Nurse Practitioner

## 2018-03-28 ENCOUNTER — Encounter: Payer: Self-pay | Admitting: Family

## 2018-03-29 ENCOUNTER — Other Ambulatory Visit: Payer: Self-pay

## 2018-03-29 DIAGNOSIS — I255 Ischemic cardiomyopathy: Secondary | ICD-10-CM

## 2018-03-29 MED ORDER — CARVEDILOL 3.125 MG PO TABS: 3.1250 mg | ORAL_TABLET | Freq: Two times a day (BID) | ORAL | 1 refills | Status: DC

## 2018-04-14 ENCOUNTER — Encounter: Payer: Self-pay | Admitting: Family

## 2018-04-14 ENCOUNTER — Other Ambulatory Visit: Payer: Self-pay | Admitting: Family

## 2018-04-15 MED ORDER — LEVOTHYROXINE SODIUM 100 MCG PO TABS: 100.0000 ug | ORAL_TABLET | Freq: Every day | ORAL | 0 refills | Status: DC

## 2018-05-10 ENCOUNTER — Encounter: Payer: Self-pay | Admitting: Family

## 2018-05-14 ENCOUNTER — Ambulatory Visit (INDEPENDENT_AMBULATORY_CARE_PROVIDER_SITE_OTHER): Payer: Medicare Other | Admitting: *Deleted

## 2018-05-14 DIAGNOSIS — I441 Atrioventricular block, second degree: Secondary | ICD-10-CM

## 2018-05-14 NOTE — Progress Notes (Signed)
Remote pacemaker transmission.   

## 2018-05-20 NOTE — Progress Notes (Signed)
HPI The patient presents for follow up of CAD.  He has a mildly reduced EF and moderate AI .   She has a distant history of stenting to her LAD. She has had a reduced EF since she was seen previously by Dr. Olevia Perches.  I reviewed an echo from 2011 and the EF was 40 - 45%.  She had mild AI.  Her EF in June of this year was 50 - 55%  with moderate AI.    She returns for follow up.  She is doing well since I last saw her.  She might have mild shortness of breath when hiking up an incline.  Otherwise she is not having any new cardiovascular complaints.  She saw Dr. Rayann Heman since she saw me.  She is up-to-date with device follow-up.  Allergies  Allergen Reactions  . Bee Venom Anaphylaxis  . Codeine Other (See Comments)    REACTION: Passed  out, facial swelling and hives REACTION: Passed  out, facial swelling and hives  . Pneumovax 23 [Pneumococcal Vac Polyvalent] Swelling    Local swelling and pain  . Lisinopril     COUGH  . Hornet Venom Hives    Hives over entire body  . Sulfonamide Derivatives Hives    Current Outpatient Medications  Medication Sig Dispense Refill  . aspirin EC 81 MG tablet Take 81 mg by mouth daily.    Marland Kitchen atorvastatin (LIPITOR) 80 MG tablet Take 1 tablet (80 mg total) by mouth daily. 90 tablet 1  . carvedilol (COREG) 3.125 MG tablet Take 1 tablet (3.125 mg total) by mouth 2 (two) times daily. 180 tablet 1  . clobetasol ointment (TEMOVATE) 6.75 % Apply 1 application topically daily as needed (eczema).     . EPINEPHrine 0.3 mg/0.3 mL IJ SOAJ injection Inject 0.3 mLs (0.3 mg total) into the muscle once. 2 Device 1  . ibuprofen (ADVIL) 200 MG tablet Take 2 tablets (400 mg total) by mouth every 8 (eight) hours as needed for moderate pain (with food). 30 tablet 0  . levothyroxine (SYNTHROID, LEVOTHROID) 100 MCG tablet Take 1 tablet (100 mcg total) by mouth daily before breakfast. 90 tablet 0  . losartan (COZAAR) 25 MG tablet Take 0.5 tablets (12.5 mg total) by mouth daily.  30 tablet 6   Current Facility-Administered Medications  Medication Dose Route Frequency Provider Last Rate Last Dose  . triamcinolone acetonide (KENALOG) 10 MG/ML injection 10 mg  10 mg Other Once Landis Martins, DPM        Past Medical History:  Diagnosis Date  . Blood transfusion without reported diagnosis   . BRONCHITIS   . CAD (coronary artery disease)    LAD stent 2003 ([facility]).    . Cataract   . DYSPNEA   . HLD (hyperlipidemia)   . Hypothyroidism   . Mobitz type 2 second degree heart block    s/p PPM by Dr Olevia Perches 2004  . PACEMAKER, PERMANENT     Past Surgical History:  Procedure Laterality Date  . CARDIAC CATHETERIZATION    . CYSTOSCOPY WITH RETROGRADE PYELOGRAM, URETEROSCOPY AND STENT PLACEMENT  08/19/2012   Procedure: CYSTOSCOPY WITH RETROGRADE PYELOGRAM, URETEROSCOPY AND STENT PLACEMENT;  Surgeon: Molli Hazard, MD;  Location: [facility];  Service: Urology;  Laterality: Right;  . MYOMECTOMY    . PACEMAKER GENERATOR CHANGE N/A 12/22/2011   Procedure: PACEMAKER GENERATOR CHANGE;  Surgeon: Thompson Grayer, MD;  Location[facility] C[facility] LAB;  Service: Cardiovascular;  Laterality: N/A;  .  PACEMAKER INSERTION  2004, 12/22/11   initial implant by Dr Olevia Perches, Generator change (MDT Adapta L) by Dr Rayann Heman 6/13  . TOTAL ABDOMINAL HYSTERECTOMY W/ BILATERAL SALPINGOOPHORECTOMY      ROS:  As stated in the HPI and negative for all other systems.  PHYSICAL EXAM  BP 132/68 (BP Location: Left Arm, Patient Position: Sitting, Cuff Size: Normal)   Pulse 66   Ht 5\' 10"  (1.778 m)   Wt 168 lb (76.2 kg)   LMP 10/17/1986   BMI 24.11 kg/m    GENERAL:  Well appearing NECK:  No jugular venous distention, waveform within normal limits, carotid upstroke brisk and symmetric, no bruits, no thyromegaly LUNGS:  Clear to auscultation bilaterally CHEST:  Unremarkable HEART:  PMI not displaced or sustained,S1 and S2 within normal limits, no S3, no S4, no clicks, no rubs, 2 out of 6 apical  systolic murmur radiating slightly at the aortic outflow tract, soft fourth left intercostal diastolic murmur ABD:  Flat, positive bowel sounds normal in frequency in pitch, no bruits, no rebound, no guarding, no midline pulsatile mass, no hepatomegaly, no splenomegaly EXT:  2 plus pulses throughout, no edema, no cyanosis no clubbing   EKG:  NA    Lab Results  Component Value Date   CHOL 114 07/03/2017   TRIG 78.0 07/03/2017   HDL 42.60 07/03/2017   LDLCALC 56 07/03/2017    ASSESSMENT AND PLAN   CAD:    The patient has no new sypmtoms.  No further cardiovascular testing is indicated.  We will continue with aggressive risk reduction and meds as listed.  She had no high risk findings on perfusion study last year.  No change in therapy.   STATUS POST PACEMAKER:   She is up to date with follow up of her device and saw Dr. Rayann Heman this summer.   HTN:     This is being managed in the context of treating his CHF blood pressure today was very slightly elevated but no change in therapy.  She checks it at home and is well controlled.  DYSLIPIDEMIA:    Her LDL is as above and excellent.  No change in therapy is indicated.  REDUCED EF:   EF was 50 - 55% on echo earlier this year.  I will follow-up with another echo in May of next year.  She has not tolerated med changes and so I will not titrate her medications at this point.  AI/MR:  There is moderate AI and unchanged on the recent echo.   I will follow-up again in May.

## 2018-05-23 ENCOUNTER — Encounter: Payer: Self-pay | Admitting: Cardiology

## 2018-05-23 ENCOUNTER — Ambulatory Visit: Payer: Medicare Other | Admitting: Cardiology

## 2018-05-23 VITALS — BP 132/68 | HR 66 | Ht 70.0 in | Wt 168.0 lb

## 2018-05-23 DIAGNOSIS — I255 Ischemic cardiomyopathy: Secondary | ICD-10-CM

## 2018-05-23 DIAGNOSIS — I351 Nonrheumatic aortic (valve) insufficiency: Secondary | ICD-10-CM | POA: Diagnosis not present

## 2018-05-23 NOTE — Patient Instructions (Signed)
Medication Instructions:  Continue current medications  If you need a refill on your cardiac medications before your next appointment, please call your pharmacy.  Labwork: None Ordered   If you have labs (blood work) drawn today and your tests are completely normal, you will receive your results only by: Marland Kitchen MyChart Message (if you have MyChart) OR . A paper copy in the mail If you have any lab test that is abnormal or we need to change your treatment, we will call you to review the results.  Testing/Procedures: Your physician has requested that you have an echocardiogram in May 2020. Echocardiography is a painless test that uses sound waves to create images of your heart. It provides your doctor with information about the size and shape of your heart and how well your heart's chambers and valves are working. This procedure takes approximately one hour. There are no restrictions for this procedure.  Follow-Up: You will need a follow up appointment in 6 Months.  Please call our office 2 months in advance([phone]) to schedule the (6 Months) appointment.  You may see  DR Percival Spanish, or one of the following Advanced Practice Providers on your designated Care Team:    . Jory Sims, DNP, ANP . Rhonda Barrett, PA-C  . Kerin Ransom, [place]  . Almyra Deforest, PA-C . Fabian Sharp, PA-C  At [facility], you and your health needs are our priority.  As part of our continuing mission to provide you with exceptional heart care, we have created designated Provider Care Teams.  These Care Teams include your primary Cardiologist (physician) and Advanced Practice Providers (APPs -  Physician Assistants and Nurse Practitioners) who all work together to provide you with the care you need, when you need it.   Thank you for choosing [facility]!!

## 2018-05-24 ENCOUNTER — Encounter: Payer: Self-pay | Admitting: Cardiology

## 2018-05-28 ENCOUNTER — Encounter: Payer: Self-pay | Admitting: Family

## 2018-05-29 NOTE — Progress Notes (Addendum)
Subjective:   Madeline Schaefer is a 72 y.o. female who presents for Medicare Annual (Subsequent) preventive examination.  Pt about to travel to [place] for 5 months.  Review of Systems: No ROS.  Medicare Wellness Visit. Additional risk factors are reflected in the social history. Cardiac Risk Factors include: advanced age (>50men, >24 women);dyslipidemia;hypertension Sleep patterns:  Overall sleeps well Home Safety/Smoke Alarms: Feels safe in home. Smoke alarms in place. Lives with husband in one story home.   Female:     Mammo-  utd     Dexa scan-  utd      CCS- 10/15/13-normal.  10 yr recall     Objective:     Vitals: BP 132/80 (BP Location: Left Arm, Patient Position: Sitting, Cuff Size: Normal)   Pulse 67   Ht 5\' 10"  (1.778 m)   Wt 170 lb (77.1 kg)   LMP 10/17/1986   SpO2 99%   BMI 24.39 kg/m   Body mass index is 24.39 kg/m.  Advanced Directives 05/30/2018 09/15/2016 10/15/2013 08/19/2012  Does Patient Have a Medical Advance Directive? Yes Yes Patient has advance directive, copy not in chart Patient has advance directive, copy not in chart  Type of Advance Directive [place];Living will [place];Living will - [place];Living will  Copy of [place] in Chart? No - copy requested No - copy requested - (No Data)  Pre-existing out of facility DNR order (yellow form or pink MOST form) - - - No    Tobacco Social History   Tobacco Use  Smoking Status Never Smoker  Smokeless Tobacco Never Used     Counseling given: Not Answered   Clinical Intake: Pain : No/denies pain    Past Medical History:  Diagnosis Date  . Blood transfusion without reported diagnosis   . BRONCHITIS   . CAD (coronary artery disease)    LAD stent 2003 ([facility]).    . Cataract   . DYSPNEA   . HLD (hyperlipidemia)   . Hypothyroidism   . Mobitz type 2 second degree heart block    s/p PPM by Dr Olevia Perches 2004  . PACEMAKER,  PERMANENT    Past Surgical History:  Procedure Laterality Date  . CARDIAC CATHETERIZATION    . CYSTOSCOPY WITH RETROGRADE PYELOGRAM, URETEROSCOPY AND STENT PLACEMENT  08/19/2012   Procedure: CYSTOSCOPY WITH RETROGRADE PYELOGRAM, URETEROSCOPY AND STENT PLACEMENT;  Surgeon: Molli Hazard, MD;  [facility];  Service: Urology;  Laterality: Right;  . MYOMECTOMY    . PACEMAKER GENERATOR CHANGE N/A 12/22/2011   Procedure: PACEMAKER GENERATOR CHANGE;  Surgeon: Thompson Grayer, MD;  Location: [facility] CATH LAB;  Service: Cardiovascular;  Laterality: N/A;  . PACEMAKER INSERTION  2004, 12/22/11   initial implant by Dr Olevia Perches, Generator change (MDT Adapta L) by Dr Rayann Heman 6/13  . TOTAL ABDOMINAL HYSTERECTOMY W/ BILATERAL SALPINGOOPHORECTOMY     Family History  Problem Relation Age of Onset  . Heart attack Mother        died at age 40  . Breast cancer Mother        age 6, had mastectomy.   . Prostate cancer Father    Social History   Socioeconomic History  . Marital status: Married    Spouse name: Not on file  . Number of children: 0  . Years of education: Not on file  . Highest education level: Not on file  Occupational History  . Not on file  Social Needs  . Financial resource strain: Not on file  . Food insecurity:    Worry: Not on file    Inability: Not on file  . Transportation needs:    Medical: Not on file    Non-medical: Not on file  Tobacco Use  . Smoking status: Never Smoker  . Smokeless tobacco: Never Used  Substance and Sexual Activity  . Alcohol use: Yes    Comment: 1-2 drink daily  . Drug use: No  . Sexual activity: Yes  Lifestyle  . Physical activity:    Days per week: Not on file    Minutes per session: Not on file  . Stress: Not on file  Relationships  . Social connections:    Talks on phone: Not on file    Gets together: Not on file    Attends religious service: Not on file    Active member of club or organization: Not on file     Attends meetings of clubs or organizations: Not on file    Relationship status: Not on file  Other Topics Concern  . Not on file  Social History Narrative   Retired Furniture conservator/restorer   Enjoys RV, bikes   No children   No pets    Outpatient Encounter Medications as of 05/30/2018  Medication Sig  . aspirin EC 81 MG tablet Take 81 mg by mouth daily.  Marland Kitchen atorvastatin (LIPITOR) 80 MG tablet Take 1 tablet (80 mg total) by mouth daily.  . carvedilol (COREG) 3.125 MG tablet Take 1 tablet (3.125 mg total) by mouth 2 (two) times daily.  . clobetasol ointment (TEMOVATE) 5.63 % Apply 1 application topically daily as needed (eczema).   Marland Kitchen ibuprofen (ADVIL) 200 MG tablet Take 2 tablets (400 mg total) by mouth every 8 (eight) hours as needed for moderate pain (with food).  Marland Kitchen levothyroxine (SYNTHROID, LEVOTHROID) 100 MCG tablet Take 1 tablet (100 mcg total) by mouth daily before breakfast.  . EPINEPHrine 0.3 mg/0.3 mL IJ SOAJ injection Inject 0.3 mLs (0.3 mg total) into the muscle once. (Patient not taking: Reported on 05/30/2018)  . losartan (COZAAR) 25 MG tablet Take 0.5 tablets (12.5 mg total) by mouth daily.   Facility-Administered Encounter Medications as of 05/30/2018  Medication  . triamcinolone acetonide (KENALOG) 10 MG/ML injection 10 mg    Activities of Daily Living In your present state of health, do you have any difficulty performing the following activities: 05/30/2018  Hearing? N  Vision? N  Difficulty concentrating or making decisions? N  Walking or climbing stairs? N  Dressing or bathing? N  Doing errands, shopping? N  Preparing Food and eating ? N  Using the Toilet? N  In the past six months, have you accidently leaked urine? N  Do you have problems with loss of bowel control? N  Managing your Medications? N  Managing your Finances? N  Housekeeping or managing your Housekeeping? N  Some recent data might be hidden    Patient Care Team: [name], NP as PCP -  General (Internal Medicine) Gatha Mayer, MD as Consulting Physician (Gastroenterology) Minus Breeding, MD as Consulting Physician (Cardiology)    Assessment:   This is a routine wellness examination for Madeline Schaefer. Physical assessment deferred to PCP.  Exercise Activities and Dietary recommendations Exercise limited by: None identified Diet (meal preparation, eat out, water intake, caffeinated beverages, dairy products, fruits and vegetables): in general, a "healthy" diet  , well balanced     Goals    .  Continue traveling       Fall Risk Fall Risk  05/30/2018 09/15/2016 07/11/2016 04/28/2016 11/24/2014  Falls in the past year? 0 No No No Yes  Comment - - - - -  Number falls in past yr: - - - - -  Injury with Fall? - - - - -  Comment - - - - -  Risk for fall due to : - - - - Other (Comment)   Depression Screen PHQ 2/9 Scores 05/30/2018 09/15/2016 07/11/2016 04/28/2016  PHQ - 2 Score 0 0 0 0     Cognitive Function Ad8 score reviewed for issues:  Issues making decisions:no  Less interest in hobbies / activities:no  Repeats questions, stories (family complaining):no  Trouble using ordinary gadgets (microwave, computer, phone):no  Forgets the month or year: no  Mismanaging finances: no  Remembering appts:no  Daily problems with thinking and/or memory:no Ad8 score is=0         Immunization History  Administered Date(s) Administered  . Influenza Whole 06/01/2008, 05/06/2012  . Influenza, High Dose Seasonal PF 05/12/2015, 04/28/2016, 04/27/2018  . Influenza-Unspecified 05/17/2013, 06/10/2017  . Pneumococcal Conjugate-13 11/23/2014  . Pneumococcal Polysaccharide-23 05/04/2008, 04/28/2016  . Td 10/06/2003, 11/14/2005  . Tdap 05/02/2011  . Zoster 07/16/2007  . Zoster Recombinat (Shingrix) 01/10/2018, 03/19/2018   Screening Tests Health Maintenance  Topic Date Due  . MAMMOGRAM  10/30/2019  . TETANUS/TDAP  05/01/2021  . COLONOSCOPY  10/16/2023  . INFLUENZA  VACCINE  Completed  . DEXA SCAN  Completed  . Hepatitis C Screening  Completed  . PNA vac Low Risk Adult  Completed       Plan:    Please schedule your next medicare wellness visit with me in 1 yr.  Continue to eat heart healthy diet (full of fruits, vegetables, whole grains, lean protein, water--limit salt, fat, and sugar intake) and increase physical activity as tolerated.  Bring a copy of your living will and/or healthcare power of attorney to your next office visit.    I have personally reviewed and noted the following in the patient's chart:   . Medical and social history . Use of alcohol, tobacco or illicit drugs  . Current medications and supplements . Functional ability and status . Nutritional status . Physical activity . Advanced directives . List of other physicians . Hospitalizations, surgeries, and ER visits in previous 12 months . Vitals . Screenings to include cognitive, depression, and falls . Referrals and appointments  In addition, I have reviewed and discussed with patient certain preventive protocols, quality metrics, and best practice recommendations. A written personalized care plan for preventive services as well as general preventive health recommendations were provided to patient.     Shela Nevin, [place]  05/30/2018   Reviewed and agree with assessment & plan of RN.  Mackie Pai, PA-C

## 2018-05-30 ENCOUNTER — Encounter: Payer: Self-pay | Admitting: *Deleted

## 2018-05-30 ENCOUNTER — Ambulatory Visit (INDEPENDENT_AMBULATORY_CARE_PROVIDER_SITE_OTHER): Payer: Medicare Other | Admitting: *Deleted

## 2018-05-30 ENCOUNTER — Encounter: Payer: Self-pay | Admitting: Family

## 2018-05-30 VITALS — BP 132/80 | HR 67 | Ht 70.0 in | Wt 170.0 lb

## 2018-05-30 DIAGNOSIS — Z0000 Encounter for general adult medical examination without abnormal findings: Secondary | ICD-10-CM | POA: Diagnosis not present

## 2018-05-30 NOTE — Patient Instructions (Signed)
Please schedule your next medicare wellness visit with me in 1 yr.  Continue to eat heart healthy diet (full of fruits, vegetables, whole grains, lean protein, water--limit salt, fat, and sugar intake) and increase physical activity as tolerated.  Bring a copy of your living will and/or healthcare power of attorney to your next office visit.   Madeline Schaefer , Thank you for taking time to come for your Medicare Wellness Visit. I appreciate your ongoing commitment to your health goals. Please review the following plan we discussed and let me know if I can assist you in the future.   These are the goals we discussed: Goals    . Continue traveling       This is a list of the screening recommended for you and due dates:  Health Maintenance  Topic Date Due  . Mammogram  10/30/2019  . Tetanus Vaccine  05/01/2021  . Colon Cancer Screening  10/16/2023  . Flu Shot  Completed  . DEXA scan (bone density measurement)  Completed  .  Hepatitis C: One time screening is recommended by Center for Disease Control  (CDC) for  adults born from [birth year] through 1964.   Completed  . Pneumonia vaccines  Completed     Health Maintenance for Postmenopausal Women Menopause is a normal process in which your reproductive ability comes to an end. This process happens gradually over a span of months to years, usually between the ages of 3 and 61. Menopause is complete when you have missed 12 consecutive menstrual periods. It is important to talk with your health care provider about some of the most common conditions that affect postmenopausal women, such as heart disease, cancer, and bone loss (osteoporosis). Adopting a healthy lifestyle and getting preventive care can help to promote your health and wellness. Those actions can also lower your chances of developing some of these common conditions. What should I know about menopause? During menopause, you may experience a number of symptoms, such  as:  Moderate-to-severe hot flashes.  Night sweats.  Decrease in sex drive.  Mood swings.  Headaches.  Tiredness.  Irritability.  Memory problems.  Insomnia.  Choosing to treat or not to treat menopausal changes is an individual decision that you make with your health care provider. What should I know about hormone replacement therapy and supplements? Hormone therapy products are effective for treating symptoms that are associated with menopause, such as hot flashes and night sweats. Hormone replacement carries certain risks, especially as you become older. If you are thinking about using estrogen or estrogen with progestin treatments, discuss the benefits and risks with your health care provider. What should I know about heart disease and stroke? Heart disease, heart attack, and stroke become more likely as you age. This may be due, in part, to the hormonal changes that your body experiences during menopause. These can affect how your body processes dietary fats, triglycerides, and cholesterol. Heart attack and stroke are both medical emergencies. There are many things that you can do to help prevent heart disease and stroke:  Have your blood pressure checked at least every 1-2 years. High blood pressure causes heart disease and increases the risk of stroke.  If you are 54-66 years old, ask your health care provider if you should take aspirin to prevent a heart attack or a stroke.  Do not use any tobacco products, including cigarettes, chewing tobacco, or electronic cigarettes. If you need help quitting, ask your health care provider.  It is important to  eat a healthy diet and maintain a healthy weight. ? Be sure to include plenty of vegetables, fruits, low-fat dairy products, and lean protein. ? Avoid eating foods that are high in solid fats, added sugars, or salt (sodium).  Get regular exercise. This is one of the most important things that you can do for your health. ? Try  to exercise for at least 150 minutes each week. The type of exercise that you do should increase your heart rate and make you sweat. This is known as moderate-intensity exercise. ? Try to do strengthening exercises at least twice each week. Do these in addition to the moderate-intensity exercise.  Know your numbers.Ask your health care provider to check your cholesterol and your blood glucose. Continue to have your blood tested as directed by your health care provider.  What should I know about cancer screening? There are several types of cancer. Take the following steps to reduce your risk and to catch any cancer development as early as possible. Breast Cancer  Practice breast self-awareness. ? This means understanding how your breasts normally appear and feel. ? It also means doing regular breast self-exams. Let your health care provider know about any changes, no matter how small.  If you are 72 or older, have a clinician do a breast exam (clinical breast exam or CBE) every year. Depending on your age, family history, and medical history, it may be recommended that you also have a yearly breast X-ray (mammogram).  If you have a family history of breast cancer, talk with your health care provider about genetic screening.  If you are at high risk for breast cancer, talk with your health care provider about having an MRI and a mammogram every year.  Breast cancer (BRCA) gene test is recommended for women who have family members with BRCA-related cancers. Results of the assessment will determine the need for genetic counseling and BRCA1 and for BRCA2 testing. BRCA-related cancers include these types: ? Breast. This occurs in males or females. ? Ovarian. ? Tubal. This may also be called fallopian tube cancer. ? Cancer of the abdominal or pelvic lining (peritoneal cancer). ? Prostate. ? Pancreatic.  Cervical, Uterine, and Ovarian Cancer Your health care provider may recommend that you be  screened regularly for cancer of the pelvic organs. These include your ovaries, uterus, and vagina. This screening involves a pelvic exam, which includes checking for microscopic changes to the surface of your cervix (Pap test).  For women ages 21-65, health care providers may recommend a pelvic exam and a Pap test every three years. For women ages 35-65, they may recommend the Pap test and pelvic exam, combined with testing for human papilloma virus (HPV), every five years. Some types of HPV increase your risk of cervical cancer. Testing for HPV may also be done on women of any age who have unclear Pap test results.  Other health care providers may not recommend any screening for nonpregnant women who are considered low risk for pelvic cancer and have no symptoms. Ask your health care provider if a screening pelvic exam is right for you.  If you have had past treatment for cervical cancer or a condition that could lead to cancer, you need Pap tests and screening for cancer for at least 20 years after your treatment. If Pap tests have been discontinued for you, your risk factors (such as having a new sexual partner) need to be reassessed to determine if you should start having screenings again. Some women have  medical problems that increase the chance of getting cervical cancer. In these cases, your health care provider may recommend that you have screening and Pap tests more often.  If you have a family history of uterine cancer or ovarian cancer, talk with your health care provider about genetic screening.  If you have vaginal bleeding after reaching menopause, tell your health care provider.  There are currently no reliable tests available to screen for ovarian cancer.  Lung Cancer Lung cancer screening is recommended for adults 27-24 years old who are at high risk for lung cancer because of a history of smoking. A yearly low-dose CT scan of the lungs is recommended if you:  Currently  smoke.  Have a history of at least 30 pack-years of smoking and you currently smoke or have quit within the past 15 years. A pack-year is smoking an average of one pack of cigarettes per day for one year.  Yearly screening should:  Continue until it has been 15 years since you quit.  Stop if you develop a health problem that would prevent you from having lung cancer treatment.  Colorectal Cancer  This type of cancer can be detected and can often be prevented.  Routine colorectal cancer screening usually begins at age 105 and continues through age 110.  If you have risk factors for colon cancer, your health care provider may recommend that you be screened at an earlier age.  If you have a family history of colorectal cancer, talk with your health care provider about genetic screening.  Your health care provider may also recommend using home test kits to check for hidden blood in your stool.  A small camera at the end of a tube can be used to examine your colon directly (sigmoidoscopy or colonoscopy). This is done to check for the earliest forms of colorectal cancer.  Direct examination of the colon should be repeated every 5-10 years until age 96. However, if early forms of precancerous polyps or small growths are found or if you have a family history or genetic risk for colorectal cancer, you may need to be screened more often.  Skin Cancer  Check your skin from head to toe regularly.  Monitor any moles. Be sure to tell your health care provider: ? About any new moles or changes in moles, especially if there is a change in a mole's shape or color. ? If you have a mole that is larger than the size of a pencil eraser.  If any of your family members has a history of skin cancer, especially at a young age, talk with your health care provider about genetic screening.  Always use sunscreen. Apply sunscreen liberally and repeatedly throughout the day.  Whenever you are outside, protect  yourself by wearing long sleeves, pants, a wide-brimmed hat, and sunglasses.  What should I know about osteoporosis? Osteoporosis is a condition in which bone destruction happens more quickly than new bone creation. After menopause, you may be at an increased risk for osteoporosis. To help prevent osteoporosis or the bone fractures that can happen because of osteoporosis, the following is recommended:  If you are 28-75 years old, get at least 1,000 mg of calcium and at least 600 mg of vitamin D per day.  If you are older than age 68 but younger than age 29, get at least 1,200 mg of calcium and at least 600 mg of vitamin D per day.  If you are older than age 99, get at least 26,200  mg of calcium and at least 800 mg of vitamin D per day.  Smoking and excessive alcohol intake increase the risk of osteoporosis. Eat foods that are rich in calcium and vitamin D, and do weight-bearing exercises several times each week as directed by your health care provider. What should I know about how menopause affects my mental health? Depression may occur at any age, but it is more common as you become older. Common symptoms of depression include:  Low or sad mood.  Changes in sleep patterns.  Changes in appetite or eating patterns.  Feeling an overall lack of motivation or enjoyment of activities that you previously enjoyed.  Frequent crying spells.  Talk with your health care provider if you think that you are experiencing depression. What should I know about immunizations? It is important that you get and maintain your immunizations. These include:  Tetanus, diphtheria, and pertussis (Tdap) booster vaccine.  Influenza every year before the flu season begins.  Pneumonia vaccine.  Shingles vaccine.  Your health care provider may also recommend other immunizations. This information is not intended to replace advice given to you by your health care provider. Make sure you discuss any questions you  have with your health care provider. Document Released: 08/25/2005 Document Revised: 01/21/2016 Document Reviewed: 04/06/2015 Elsevier Interactive Patient Education  2018 [employer].

## 2018-06-03 ENCOUNTER — Encounter: Payer: Self-pay | Admitting: Family

## 2018-06-03 ENCOUNTER — Ambulatory Visit: Payer: Medicare Other | Admitting: Family

## 2018-06-03 VITALS — BP 136/65 | HR 64 | Temp 98.7°F | Resp 16 | Ht 70.0 in | Wt 170.0 lb

## 2018-06-03 DIAGNOSIS — I255 Ischemic cardiomyopathy: Secondary | ICD-10-CM

## 2018-06-03 DIAGNOSIS — E785 Hyperlipidemia, unspecified: Secondary | ICD-10-CM | POA: Diagnosis not present

## 2018-06-03 DIAGNOSIS — F419 Anxiety disorder, unspecified: Secondary | ICD-10-CM | POA: Diagnosis not present

## 2018-06-03 DIAGNOSIS — E039 Hypothyroidism, unspecified: Secondary | ICD-10-CM | POA: Diagnosis not present

## 2018-06-03 DIAGNOSIS — I10 Essential (primary) hypertension: Secondary | ICD-10-CM

## 2018-06-03 LAB — COMPREHENSIVE METABOLIC PANEL
ALBUMIN: 4.1 g/dL (ref 3.5–5.2)
ALK PHOS: 69 U/L (ref 39–117)
ALT: 29 U/L (ref 0–35)
AST: 24 U/L (ref 0–37)
BUN: 21 mg/dL (ref 6–23)
CALCIUM: 9 mg/dL (ref 8.4–10.5)
CHLORIDE: 104 meq/L (ref 96–112)
CO2: 29 mEq/L (ref 19–32)
Creatinine, Ser: 0.72 mg/dL (ref 0.40–1.20)
GFR: 84.66 mL/min (ref >60.00)
Glucose, Bld: 94 mg/dL (ref 70–99)
POTASSIUM: 4.4 meq/L (ref 3.5–5.1)
Sodium: 139 mEq/L (ref 135–145)
TOTAL PROTEIN: 6.6 g/dL (ref 6.0–8.3)
Total Bilirubin: 0.8 mg/dL (ref 0.2–1.2)

## 2018-06-03 LAB — LIPID PANEL
CHOLESTEROL: 135 mg/dL (ref 0–200)
HDL: 44.6 mg/dL (ref >39.00)
LDL CALC: 67 mg/dL (ref 0–99)
NonHDL: 90.46
TRIGLYCERIDES: 115 mg/dL (ref 0.0–149.0)
Total CHOL/HDL Ratio: 3
VLDL: 23 mg/dL (ref 0.0–40.0)

## 2018-06-03 LAB — TSH: TSH: 1.14 u[IU]/mL (ref 0.35–4.50)

## 2018-06-03 MED ORDER — CARVEDILOL 3.125 MG PO TABS: 3.1250 mg | ORAL_TABLET | Freq: Two times a day (BID) | ORAL | 0 refills | Status: DC

## 2018-06-03 MED ORDER — LOSARTAN POTASSIUM 25 MG PO TABS: 12.5000 mg | ORAL_TABLET | Freq: Every day | ORAL | 0 refills | Status: DC

## 2018-06-03 MED ORDER — ATORVASTATIN CALCIUM 80 MG PO TABS: 80.0000 mg | ORAL_TABLET | Freq: Every day | ORAL | 0 refills | Status: DC

## 2018-06-03 MED ORDER — LEVOTHYROXINE SODIUM 100 MCG PO TABS: 100.0000 ug | ORAL_TABLET | Freq: Every day | ORAL | 0 refills | Status: DC

## 2018-06-03 NOTE — Progress Notes (Signed)
Subjective:    Patient ID: Madeline Schaefer, female    DOB: 1945-11-21, 72 y.o.   MRN: [id number]  HPI  Pt is a 72 yr old female who presents today for follow up. She plans to spend the next 5 months in [place].  Hypothyroid- Maintained on synthroid.  Lab Results  Component Value Date   TSH 1.68 08/17/2017   Hyperlipidemia- maintained on atorvastatin.  Denies myalgia.   Lab Results  Component Value Date   CHOL 114 07/03/2017   HDL 42.60 07/03/2017   LDLCALC 56 07/03/2017   TRIG 78.0 07/03/2017   CHOLHDL 3 07/03/2017   Anxiety- reports that "my mind works overtime."  She will be in [place] for 5 months.  Has been stressing about packing and unpacking recently.   HTN- she denies swelling.  Maintained on losartan 25mg .  BP Readings from Last 3 Encounters:  06/03/18 136/65  05/30/18 132/80  05/23/18 132/68       Review of Systems   See HPI  Past Medical History:  Diagnosis Date  . Blood transfusion without reported diagnosis   . BRONCHITIS   . CAD (coronary artery disease)    LAD stent 2003 ([facility]).    . Cataract   . DYSPNEA   . HLD (hyperlipidemia)   . Hypothyroidism   . Mobitz type 2 second degree heart block    s/p PPM by Dr Olevia Perches 2004  . PACEMAKER, PERMANENT      Social History   Socioeconomic History  . Marital status: Married    Spouse name: Not on file  . Number of children: 0  . Years of education: Not on file  . Highest education level: Not on file  Occupational History  . Not on file  Social Needs  . Financial resource strain: Not on file  . Food insecurity:    Worry: Not on file    Inability: Not on file  . Transportation needs:    Medical: Not on file    Non-medical: Not on file  Tobacco Use  . Smoking status: Never Smoker  . Smokeless tobacco: Never Used  Substance and Sexual Activity  . Alcohol use: Yes    Comment: 1-2 drink daily  . Drug use: No  . Sexual activity: Yes  Lifestyle  . Physical activity:    Days per week: Not  on file    Minutes per session: Not on file  . Stress: Not on file  Relationships  . Social connections:    Talks on phone: Not on file    Gets together: Not on file    Attends religious service: Not on file    Active member of club or organization: Not on file    Attends meetings of clubs or organizations: Not on file    Relationship status: Not on file  . Intimate partner violence:    Fear of current or ex partner: Not on file    Emotionally abused: Not on file    Physically abused: Not on file    Forced sexual activity: Not on file  Other Topics Concern  . Not on file  Social History Narrative   Retired Furniture conservator/restorer   Enjoys RV, bikes   No children   No pets    Past Surgical History:  Procedure Laterality Date  . CARDIAC CATHETERIZATION    . CYSTOSCOPY WITH RETROGRADE PYELOGRAM, URETEROSCOPY AND STENT PLACEMENT  08/19/2012   Procedure: CYSTOSCOPY WITH RETROGRADE PYELOGRAM, URETEROSCOPY AND STENT PLACEMENT;  Surgeon:  Molli Hazard, MD;  Location: [facility];  Service: Urology;  Laterality: Right;  . MYOMECTOMY    . PACEMAKER GENERATOR CHANGE N/A 12/22/2011   Procedure: PACEMAKER GENERATOR CHANGE;  Surgeon: Thompson Grayer, MD;  Location: [facility] CATH LAB;  Service: Cardiovascular;  Laterality: N/A;  . PACEMAKER INSERTION  2004, 12/22/11   initial implant by Dr Olevia Perches, Generator change (MDT Adapta L) by Dr Rayann Heman 6/13  . TOTAL ABDOMINAL HYSTERECTOMY W/ BILATERAL SALPINGOOPHORECTOMY      Family History  Problem Relation Age of Onset  . Heart attack Mother        died at age 75  . Breast cancer Mother        age 51, had mastectomy.   . Prostate cancer Father     Allergies  Allergen Reactions  . Bee Venom Anaphylaxis  . Codeine Other (See Comments)    REACTION: Passed  out, facial swelling and hives REACTION: Passed  out, facial swelling and hives  . Pneumovax 23 [Pneumococcal Vac Polyvalent] Swelling    Local swelling and pain  . Lisinopril      COUGH  . Hornet Venom Hives    Hives over entire body  . Sulfonamide Derivatives Hives    Current Outpatient Medications on File Prior to Visit  Medication Sig Dispense Refill  . aspirin EC 81 MG tablet Take 81 mg by mouth daily.    . clobetasol ointment (TEMOVATE) 1.82 % Apply 1 application topically daily as needed (eczema).     . EPINEPHrine 0.3 mg/0.3 mL IJ SOAJ injection Inject 0.3 mLs (0.3 mg total) into the muscle once. 2 Device 1  . ibuprofen (ADVIL) 200 MG tablet Take 2 tablets (400 mg total) by mouth every 8 (eight) hours as needed for moderate pain (with food). 30 tablet 0   Current Facility-Administered Medications on File Prior to Visit  Medication Dose Route Frequency Provider Last Rate Last Dose  . triamcinolone acetonide (KENALOG) 10 MG/ML injection 10 mg  10 mg Other Once Stover, Titorya, DPM        BP 136/65 (BP Location: Right Arm, Patient Position: Sitting, Cuff Size: Small)   Pulse 64   Temp 98.7 F (37.1 C) (Oral)   Resp 16   Ht 5\' 10"  (1.778 m)   Wt 170 lb (77.1 kg)   LMP 10/17/1986   SpO2 100%   BMI 24.39 kg/m        Objective:   Physical Exam  Constitutional: She is oriented to person, place, and time. She appears well-developed and well-nourished.  Cardiovascular: Normal rate, regular rhythm and normal heart sounds.  No murmur heard. Pulmonary/Chest: Effort normal and breath sounds normal. No respiratory distress. She has no wheezes.  Musculoskeletal: She exhibits no edema.  Neurological: She is alert and oriented to person, place, and time.  Psychiatric: She has a normal mood and affect. Her behavior is normal. Judgment and thought content normal.          Assessment & Plan:  HTN- bp stable, continue current meds. Obtain follow up bmet.  Hyperlipidemia- tolerating statin, obtain follow up lipid panel.  Hypothyroid- clinically stable on synthroid. Continue same, obtain follow up lipid panel.  Anxiety- we discussed possibility of adding  a medication. She wants to see how she feels after she gets settled in [place] and if symptoms persist she will visit her PCP in [place] to discuss med therapy. I advised her that I was willing to treat her but that she would need  a 1 month follow up which we cannot do with her planned trip.

## 2018-06-03 NOTE — Patient Instructions (Signed)
Please complete lab work prior to leaving.   

## 2018-06-24 ENCOUNTER — Ambulatory Visit: Payer: Medicare Other | Admitting: Family

## 2018-07-14 LAB — CUP PACEART REMOTE DEVICE CHECK
Battery Impedance: 603 Ohm
Battery Remaining Longevity: 80 mo
Brady Statistic AP VS Percent: 0 %
Brady Statistic AS VS Percent: 0 %
Implantable Lead Implant Date: 20040907
Implantable Lead Implant Date: 20040907
Implantable Lead Location: 753859
Implantable Lead Model: 5076
Implantable Pulse Generator Implant Date: 20130607
Lead Channel Impedance Value: 727 Ohm
Lead Channel Pacing Threshold Amplitude: 0.75 V
Lead Channel Pacing Threshold Pulse Width: 0.4 ms
Lead Channel Pacing Threshold Pulse Width: 0.4 ms
Lead Channel Setting Pacing Amplitude: 2 V
Lead Channel Setting Pacing Amplitude: 2.5 V
Lead Channel Setting Sensing Sensitivity: 2 mV
MDC IDC LEAD LOCATION: 753860
MDC IDC MSMT BATTERY VOLTAGE: 2.79 V
MDC IDC MSMT LEADCHNL RA IMPEDANCE VALUE: 409 Ohm
MDC IDC MSMT LEADCHNL RA PACING THRESHOLD AMPLITUDE: 0.625 V
MDC IDC SESS DTM: 20191029112015
MDC IDC SET LEADCHNL RV PACING PULSEWIDTH: 0.4 ms
MDC IDC STAT BRADY AP VP PERCENT: 15 %
MDC IDC STAT BRADY AS VP PERCENT: 85 %

## 2018-08-13 ENCOUNTER — Ambulatory Visit (INDEPENDENT_AMBULATORY_CARE_PROVIDER_SITE_OTHER): Payer: Medicare Other

## 2018-08-13 DIAGNOSIS — I441 Atrioventricular block, second degree: Secondary | ICD-10-CM | POA: Diagnosis not present

## 2018-08-14 NOTE — Progress Notes (Signed)
Remote pacemaker transmission.   

## 2018-08-16 LAB — CUP PACEART REMOTE DEVICE CHECK
Battery Impedance: 654 Ohm
Brady Statistic AS VP Percent: 84 %
Brady Statistic AS VS Percent: 0 %
Date Time Interrogation Session: 20200129133519
Implantable Lead Implant Date: 20040907
Implantable Lead Location: 753859
Lead Channel Impedance Value: 637 Ohm
Lead Channel Pacing Threshold Amplitude: 0.625 V
Lead Channel Pacing Threshold Amplitude: 0.75 V
Lead Channel Pacing Threshold Pulse Width: 0.4 ms
Lead Channel Setting Pacing Amplitude: 2 V
Lead Channel Setting Pacing Amplitude: 2.5 V
MDC IDC LEAD IMPLANT DT: 20040907
MDC IDC LEAD LOCATION: 753860
MDC IDC MSMT BATTERY REMAINING LONGEVITY: 76 mo
MDC IDC MSMT BATTERY VOLTAGE: 2.79 V
MDC IDC MSMT LEADCHNL RA IMPEDANCE VALUE: 462 Ohm
MDC IDC MSMT LEADCHNL RA PACING THRESHOLD PULSEWIDTH: 0.4 ms
MDC IDC PG IMPLANT DT: 20130607
MDC IDC SET LEADCHNL RV PACING PULSEWIDTH: 0.4 ms
MDC IDC SET LEADCHNL RV SENSING SENSITIVITY: 2 mV
MDC IDC STAT BRADY AP VP PERCENT: 16 %
MDC IDC STAT BRADY AP VS PERCENT: 0 %

## 2018-09-20 ENCOUNTER — Encounter: Payer: Self-pay | Admitting: Family

## 2018-09-20 ENCOUNTER — Other Ambulatory Visit: Payer: Self-pay | Admitting: Family

## 2018-09-20 MED ORDER — LEVOTHYROXINE SODIUM 100 MCG PO TABS: 100.0000 ug | ORAL_TABLET | Freq: Every day | ORAL | 1 refills | Status: DC

## 2018-10-13 ENCOUNTER — Encounter: Payer: Self-pay | Admitting: Family

## 2018-10-13 ENCOUNTER — Other Ambulatory Visit: Payer: Self-pay | Admitting: Family

## 2018-11-12 ENCOUNTER — Ambulatory Visit (INDEPENDENT_AMBULATORY_CARE_PROVIDER_SITE_OTHER): Payer: Medicare Other | Admitting: *Deleted

## 2018-11-12 ENCOUNTER — Other Ambulatory Visit: Payer: Self-pay

## 2018-11-12 DIAGNOSIS — I441 Atrioventricular block, second degree: Secondary | ICD-10-CM

## 2018-11-12 DIAGNOSIS — I255 Ischemic cardiomyopathy: Secondary | ICD-10-CM

## 2018-11-12 LAB — CUP PACEART REMOTE DEVICE CHECK
Battery Impedance: 731 Ohm
Battery Remaining Longevity: 72 mo
Battery Voltage: 2.77 V
Brady Statistic AP VP Percent: 18 %
Brady Statistic AP VS Percent: 0 %
Brady Statistic AS VP Percent: 82 %
Brady Statistic AS VS Percent: 0 %
Date Time Interrogation Session: 20200428114328
Implantable Lead Implant Date: 20040907
Implantable Lead Implant Date: 20040907
Implantable Lead Location: 753859
Implantable Lead Location: 753860
Implantable Lead Model: 5076
Implantable Lead Model: 5076
Implantable Pulse Generator Implant Date: 20130607
Lead Channel Impedance Value: 425 Ohm
Lead Channel Impedance Value: 680 Ohm
Lead Channel Pacing Threshold Amplitude: 0.625 V
Lead Channel Pacing Threshold Amplitude: 0.75 V
Lead Channel Pacing Threshold Pulse Width: 0.4 ms
Lead Channel Pacing Threshold Pulse Width: 0.4 ms
Lead Channel Setting Pacing Amplitude: 2 V
Lead Channel Setting Pacing Amplitude: 2.5 V
Lead Channel Setting Pacing Pulse Width: 0.4 ms
Lead Channel Setting Sensing Sensitivity: 2 mV

## 2018-11-21 ENCOUNTER — Ambulatory Visit (INDEPENDENT_AMBULATORY_CARE_PROVIDER_SITE_OTHER): Payer: Medicare Other | Admitting: Internal Medicine

## 2018-11-21 ENCOUNTER — Other Ambulatory Visit (INDEPENDENT_AMBULATORY_CARE_PROVIDER_SITE_OTHER): Payer: Medicare Other

## 2018-11-21 ENCOUNTER — Other Ambulatory Visit: Payer: Self-pay

## 2018-11-21 ENCOUNTER — Encounter: Payer: Self-pay | Admitting: Internal Medicine

## 2018-11-21 VITALS — Ht 70.0 in | Wt 150.0 lb

## 2018-11-21 DIAGNOSIS — R194 Change in bowel habit: Secondary | ICD-10-CM | POA: Diagnosis not present

## 2018-11-21 DIAGNOSIS — E039 Hypothyroidism, unspecified: Secondary | ICD-10-CM

## 2018-11-21 DIAGNOSIS — R634 Abnormal weight loss: Secondary | ICD-10-CM

## 2018-11-21 LAB — COMPREHENSIVE METABOLIC PANEL
ALT: 17 U/L (ref 0–35)
AST: 16 U/L (ref 0–37)
Albumin: 4.2 g/dL (ref 3.5–5.2)
Alkaline Phosphatase: 73 U/L (ref 39–117)
BUN: 16 mg/dL (ref 6–23)
CO2: 28 mEq/L (ref 19–32)
Calcium: 9.2 mg/dL (ref 8.4–10.5)
Chloride: 104 mEq/L (ref 96–112)
Creatinine, Ser: 0.86 mg/dL (ref 0.40–1.20)
GFR: 64.8 mL/min (ref >60.00)
Glucose, Bld: 137 mg/dL — ABNORMAL HIGH (ref 70–99)
Potassium: 5 mEq/L (ref 3.5–5.1)
Sodium: 139 mEq/L (ref 135–145)
Total Bilirubin: 0.8 mg/dL (ref 0.2–1.2)
Total Protein: 7.4 g/dL (ref 6.0–8.3)

## 2018-11-21 LAB — CBC WITH DIFFERENTIAL/PLATELET
Basophils Absolute: 0 10*3/uL (ref 0.0–0.1)
Basophils Relative: 0.6 % (ref 0.0–3.0)
Eosinophils Absolute: 0.2 10*3/uL (ref 0.0–0.7)
Eosinophils Relative: 3.2 % (ref 0.0–5.0)
HCT: 43.3 % (ref 36.0–46.0)
Hemoglobin: 14.4 g/dL (ref 12.0–15.0)
Lymphocytes Relative: 20.4 % (ref 12.0–46.0)
Lymphs Abs: 1.5 10*3/uL (ref 0.7–4.0)
MCHC: 33.3 g/dL (ref 30.0–36.0)
MCV: 87.8 fl (ref 78.0–100.0)
Monocytes Absolute: 0.4 10*3/uL (ref 0.1–1.0)
Monocytes Relative: 4.8 % (ref 3.0–12.0)
Neutro Abs: 5.2 10*3/uL (ref 1.4–7.7)
Neutrophils Relative %: 71 % (ref 43.0–77.0)
Platelets: 248 10*3/uL (ref 150.0–400.0)
RBC: 4.93 Mil/uL (ref 3.87–5.11)
RDW: 15 % (ref 11.5–15.5)
WBC: 7.4 10*3/uL (ref 4.0–10.5)

## 2018-11-21 LAB — TSH: TSH: 4.04 u[IU]/mL (ref 0.35–4.50)

## 2018-11-21 LAB — LIPASE: Lipase: 21 U/L (ref 11.0–59.0)

## 2018-11-21 NOTE — Progress Notes (Signed)
Patient from today Let her know Labs ok Please set up CT abdomen and pelvis with IV contrasyt  Dx   Loss of weight Change in bowel habits

## 2018-11-21 NOTE — Patient Instructions (Signed)
   You have been scheduled for a CT scan of the abdomen and pelvis at [facility] ([address]---this is in the same building as Press [facility]).   You are scheduled on ____________ at ________________. You should arrive 15 minutes prior to your appointment time for registration. Please follow the written instructions below on the day of your exam:  WARNING: IF YOU ARE ALLERGIC TO IODINE/X-RAY DYE, PLEASE NOTIFY RADIOLOGY IMMEDIATELY AT [phone]! YOU WILL BE GIVEN A 13 HOUR PREMEDICATION PREP.  1) Do not eat or drink anything after __________________ (4 hours prior to your test) 2) You have been given 2 bottles of oral contrast to drink. The solution may taste better if refrigerated, but do NOT add ice or any other liquid to this solution. Shake well before drinking.    Drink 1 bottle of contrast @ ________________ (2 hours prior to your exam)  Drink 1 bottle of contrast @ ________________ (1 hour prior to your exam)  You may take any medications as prescribed with a small amount of water, if necessary. If you take any of the following medications: METFORMIN, GLUCOPHAGE, GLUCOVANCE, AVANDAMET, RIOMET, FORTAMET, Barrett MET, JANUMET, GLUMETZA or METAGLIP, you MAY be asked to HOLD this medication 48 hours AFTER the exam.  The purpose of you drinking the oral contrast is to aid in the visualization of your intestinal tract. The contrast solution may cause some diarrhea. Depending on your individual set of symptoms, you may also receive an intravenous injection of x-ray contrast/dye. Plan on being at [facility] for 30 minutes or longer, depending on the type of exam you are having performed.  This test typically takes 30-45 minutes to complete.  If you have any questions regarding your exam or if you need to reschedule, you may call the CT department at [phone] between the hours of 8:00 am and 5:00 pm,  Monday-Friday.  ________________________________________________________________________ I appreciate the opportunity to care for you. Silvano Rusk, MD, [facility]

## 2018-11-21 NOTE — Progress Notes (Signed)
Remote pacemaker transmission.   

## 2018-11-21 NOTE — Progress Notes (Signed)
TELEHEALTH ENCOUNTER IN SETTING OF COVID-19 PANDEMIC - REQUESTED BY PATIENT SERVICE PROVIDED BY TELEMEDECINE - TYPE: Telephone, A/V not possible PATIENT LOCATION: Home PATIENT HAS CONSENTED TO TELEHEALTH VISIT PROVIDER LOCATION: OFFICE REFERRING PROVIDER:O'Sullivan, [name], NP PARTICIPANTS OTHER THAN PATIENT: None TIME SPENT ON CALL: 20 minutes    Madeline Schaefer 73 y.o. 02-28-1946 [id number]  Assessment & Plan:   Encounter Diagnoses  Name Primary?   Loss of weight    Change in bowel habits    Acquired hypothyroidism Yes   This sounds like she has pancreatic insufficiency.  There are multiple causes for that but in her age group and with these issues I think imaging is needed to sort this out.  She could have pancreatic cancer.  I hope not but if so we need to know that.  Start with labs and then plan for an enhanced CT scan as long as BUN and creatinine allow.  Further plans pending that could be screening for celiac disease, question small intestinal bacterial overgrowth, checking the stool for fecal elastase qualitative fecal fat.   Orders Placed This Encounter  Procedures   CBC w/Diff   TSH   Comp Met (CMET)   Lipase    I appreciate the opportunity to care for this patient. CC: Madeline Alar, NP      Subjective:   Chief Complaint: Weight loss oily stools  HPI Patient is a 73 year old white woman known to me from prior negative screening colonoscopy last of which was in 2015, and a past medical history of coronary artery disease and pacemaker implantation who has been having weight loss and a change in stools with an oily film seen in the commode.  Sometimes stools are loose or soft they do not tend to be formed.  She has had some mild abdominal pain at times.  She spent the winter in [place] and her air skiing Madeline Schaefer was eating differently, exercising a lot and thought some of the weight loss might of been due to that but she has lost 15 pounds she  thinks, 20 by our numbers.  She was prescribed dicyclomine by a primary care provider in [place].  She was not quite sure what to take it for she is used it a couple of times when she has had some cramps.  She is noticing some increased borborygmi and gas and flatus.  In 2014 she had a CT urogram did not show anything but kidney stones i.e. no pancreatic abnormality a 2013 enhanced CT scan did not show any significant pathology though she had kidney stones then.  Her appetite is slightly off.  There is no other upper GI symptomatology.  She has some chronic eczema issues as far as review of systems she has had some boils in the pubic or groin area but no new skin rash and her review of systems is otherwise completely negative.  No new medications either.  Wt Readings from Last 3 Encounters:  11/21/18 150 lb (68 kg)  06/03/18 170 lb (77.1 kg)  05/30/18 170 lb (77.1 kg)    I have reviewed labs in the computer for 2019 and her primary care visits from 2019 as well as imaging as mentioned above.  Allergies  Allergen Reactions   Bee Venom Anaphylaxis   Codeine Other (See Comments)    REACTION: Passed  out, facial swelling and hives REACTION: Passed  out, facial swelling and hives   Pneumovax 23 [Pneumococcal Vac Polyvalent] Swelling    Local swelling and  pain   Lisinopril     COUGH   Hornet Venom Hives    Hives over entire body   Sulfonamide Derivatives Hives   Current Meds  Medication Sig   aspirin EC 81 MG tablet Take 81 mg by mouth daily.   atorvastatin (LIPITOR) 80 MG tablet Take 1 tablet by mouth once daily   carvedilol (COREG) 3.125 MG tablet Take 1 tablet (3.125 mg total) by mouth 2 (two) times daily.   clobetasol ointment (TEMOVATE) 1.61 % Apply 1 application topically daily as needed (eczema).    dicyclomine (BENTYL) 20 MG tablet Take 20 mg by mouth 2 (two) times daily as needed for spasms.   EPINEPHrine 0.3 mg/0.3 mL IJ SOAJ injection Inject 0.3 mLs (0.3 mg total) into  the muscle once.   ibuprofen (ADVIL) 200 MG tablet Take 2 tablets (400 mg total) by mouth every 8 (eight) hours as needed for moderate pain (with food).   levothyroxine (SYNTHROID, LEVOTHROID) 100 MCG tablet TAKE 1 TABLET BY MOUTH ONCE DAILY BEFORE BREAKFAST   Current Facility-Administered Medications for the 11/21/18 encounter (Office Visit) with Gatha Mayer, MD  Medication   triamcinolone acetonide (KENALOG) 10 MG/ML injection 10 mg   Past Medical History:  Diagnosis Date   Blood transfusion without reported diagnosis    BRONCHITIS    CAD (coronary artery disease)    LAD stent 2003 ([facility]).     Cataract    DYSPNEA    HLD (hyperlipidemia)    Hypothyroidism    Mobitz type 2 second degree heart block    s/p PPM by Dr Olevia Perches 2004   PACEMAKER, PERMANENT    Past Surgical History:  Procedure Laterality Date   CARDIAC CATHETERIZATION     CYSTOSCOPY WITH RETROGRADE PYELOGRAM, URETEROSCOPY AND STENT PLACEMENT  08/19/2012   Procedure: CYSTOSCOPY WITH RETROGRADE PYELOGRAM, URETEROSCOPY AND STENT PLACEMENT;  Surgeon: Molli Hazard, MD;  Location: [facility];  Service: Urology;  Laterality: Right;   MYOMECTOMY     PACEMAKER GENERATOR CHANGE N/A 12/22/2011   Procedure: PACEMAKER GENERATOR CHANGE;  Surgeon: Thompson Grayer, MD;  Location: [facility] CATH LAB;  Service: Cardiovascular;  Laterality: N/A;   PACEMAKER INSERTION  2004, 12/22/11   initial implant by Dr Olevia Perches, Generator change (MDT Adapta L) by Dr Rayann Heman 6/13   TOTAL ABDOMINAL HYSTERECTOMY W/ BILATERAL SALPINGOOPHORECTOMY     Social History   Social History Narrative   Retired Furniture conservator/restorer   Enjoys RV, bikes   No children   No pets   family history includes Breast cancer in her mother; Heart attack in her mother; Prostate cancer in her father.   Review of Systems See HPI

## 2018-11-22 ENCOUNTER — Telehealth: Payer: Self-pay

## 2018-11-22 NOTE — Telephone Encounter (Signed)
Patient states that she is returning phone call

## 2018-11-22 NOTE — Telephone Encounter (Signed)
FYI

## 2018-11-22 NOTE — Telephone Encounter (Signed)
Yes, I agree that the CT scan must be moved to at least two weeks out from date of return under current policy.

## 2018-11-22 NOTE — Telephone Encounter (Signed)
Per Erline Levine in [place]. Pt has had recent travels to [place], returning to [facility] on 11-12-2018. I spoke to the patient, she states they went to a remote area, in a camper, had no contact with other people. She denies any COVID19 symptoms at this time.  Per protocol, pt has to wait the 2 week quarantine period to come to the facility. Her CT was scheduled on 11-25-2018, and has been moved to 5-15- 2020.  Do you agree?

## 2018-11-22 NOTE — Telephone Encounter (Signed)
Patient called back stating that she checked her vm and you do not have to call her back.

## 2018-11-25 ENCOUNTER — Inpatient Hospital Stay: Admission: RE | Admit: 2018-11-25 | Payer: Medicare Other | Source: Ambulatory Visit

## 2018-11-25 NOTE — Telephone Encounter (Signed)
Contrast and instructions put up front for pick up, she will come mid-week to get these.

## 2018-11-27 ENCOUNTER — Telehealth: Payer: Self-pay

## 2018-11-28 ENCOUNTER — Telehealth: Payer: Self-pay | Admitting: *Deleted

## 2018-11-28 NOTE — Telephone Encounter (Signed)
COVID-19 Pre-Screening Questions:  In the past 7 to 10 days have you had a cough, shortness of breath, headache, congestion, fever, body aches, chills, sore throat, or sudden loss of taste or sense of smell? No   Have you been around anyone with known Covid 19. No  Have you been around anyone who is awaiting Covid 19 test results in the past 7 to 10 days? No  Have you been around anyone who has been exposed to Covid 19, or has mentioned symptoms of Covid 19 within the past 7 to 10 days? NO If you have any concerns about symptoms your patients report please contact your leadership team, or the provider the patient is seeing in the office for further guidance.

## 2018-11-29 ENCOUNTER — Other Ambulatory Visit: Payer: Self-pay

## 2018-11-29 ENCOUNTER — Ambulatory Visit (INDEPENDENT_AMBULATORY_CARE_PROVIDER_SITE_OTHER)
Admission: RE | Admit: 2018-11-29 | Discharge: 2018-11-29 | Disposition: A | Discharge status: Home / Self Care | Payer: Medicare Other | Source: Ambulatory Visit | Attending: Internal Medicine | Admitting: Internal Medicine

## 2018-11-29 DIAGNOSIS — R194 Change in bowel habit: Secondary | ICD-10-CM

## 2018-11-29 DIAGNOSIS — R634 Abnormal weight loss: Secondary | ICD-10-CM

## 2018-11-29 MED ORDER — IOHEXOL 300 MG/ML  SOLN
100.0000 mL | Freq: Once | INTRAMUSCULAR | Status: AC | PRN
  Administered 2018-11-29: 100 mL via INTRAVENOUS

## 2018-12-02 ENCOUNTER — Other Ambulatory Visit: Payer: Self-pay

## 2018-12-02 ENCOUNTER — Other Ambulatory Visit: Payer: Self-pay | Admitting: Internal Medicine

## 2018-12-02 DIAGNOSIS — R197 Diarrhea, unspecified: Secondary | ICD-10-CM

## 2018-12-02 DIAGNOSIS — R634 Abnormal weight loss: Secondary | ICD-10-CM

## 2018-12-02 NOTE — Progress Notes (Signed)
ttg

## 2018-12-02 NOTE — Progress Notes (Signed)
Her CT scan shows some intra-renal kidney stones but all else ok - no signs of cancer or a cause of problems Pancreas is ok  Please have her do the following lab tests  IgA, TTG Ab (IgA based) to look for possible celiac disease Stool for fecal elastase and to look for pancreatic insufficiency  Dx diarrhea, weight loss

## 2018-12-05 ENCOUNTER — Other Ambulatory Visit (INDEPENDENT_AMBULATORY_CARE_PROVIDER_SITE_OTHER): Payer: Medicare Other

## 2018-12-05 DIAGNOSIS — R634 Abnormal weight loss: Secondary | ICD-10-CM | POA: Diagnosis not present

## 2018-12-05 DIAGNOSIS — R197 Diarrhea, unspecified: Secondary | ICD-10-CM | POA: Diagnosis not present

## 2018-12-05 LAB — IGA: IgA: 299 mg/dL (ref 68–378)

## 2018-12-06 ENCOUNTER — Telehealth: Payer: Medicare Other | Admitting: Internal Medicine

## 2018-12-06 LAB — TISSUE TRANSGLUTAMINASE, IGA: (tTG) Ab, IgA: 1 U/mL

## 2018-12-10 NOTE — Progress Notes (Signed)
Celiac testing ok Please double check on fecal elastase - not collected yet

## 2018-12-11 ENCOUNTER — Ambulatory Visit (HOSPITAL_COMMUNITY)
Admission: RE | Admit: 2018-12-11 | Discharge: 2018-12-11 | Disposition: A | Discharge status: Home / Self Care | Payer: Medicare Other | Source: Ambulatory Visit | Attending: Cardiology | Admitting: Cardiology

## 2018-12-11 ENCOUNTER — Other Ambulatory Visit: Payer: Self-pay

## 2018-12-11 DIAGNOSIS — I351 Nonrheumatic aortic (valve) insufficiency: Secondary | ICD-10-CM | POA: Diagnosis not present

## 2018-12-11 DIAGNOSIS — I255 Ischemic cardiomyopathy: Secondary | ICD-10-CM | POA: Insufficient documentation

## 2018-12-11 DIAGNOSIS — I10 Essential (primary) hypertension: Secondary | ICD-10-CM | POA: Diagnosis not present

## 2018-12-11 DIAGNOSIS — E785 Hyperlipidemia, unspecified: Secondary | ICD-10-CM | POA: Insufficient documentation

## 2018-12-11 NOTE — Progress Notes (Signed)
  Echocardiogram 2D Echocardiogram has been performed.  Madeline Schaefer 12/11/2018, 10:11 AM

## 2019-01-08 ENCOUNTER — Other Ambulatory Visit: Payer: Self-pay

## 2019-01-08 ENCOUNTER — Other Ambulatory Visit: Payer: Medicare Other

## 2019-01-08 DIAGNOSIS — R197 Diarrhea, unspecified: Secondary | ICD-10-CM

## 2019-01-12 ENCOUNTER — Other Ambulatory Visit: Payer: Self-pay | Admitting: Family

## 2019-01-12 DIAGNOSIS — I255 Ischemic cardiomyopathy: Secondary | ICD-10-CM

## 2019-01-13 ENCOUNTER — Other Ambulatory Visit: Payer: Self-pay

## 2019-01-14 LAB — PANCREATIC ELASTASE, FECAL: Pancreatic Elastase-1, Stool: 481 mcg/g

## 2019-01-15 ENCOUNTER — Telehealth: Payer: Self-pay | Admitting: Internal Medicine

## 2019-01-15 NOTE — Telephone Encounter (Signed)
Discussed w/ her Weight is up a bit Labs and CT are fine  She will try the dicyclomine she has and schedule a f/u visit to regroup

## 2019-01-15 NOTE — Telephone Encounter (Signed)
Patient reports that she has abdominal cramping first thing in the am then  maybe an hour later. Stools are loose not formed at all. After the morning episode she is normal for the day.

## 2019-01-15 NOTE — Telephone Encounter (Signed)
Patient called requesting to speak with nurse about lab results.

## 2019-01-15 NOTE — Telephone Encounter (Signed)
The test was normal.  Last time communicated with her the bowel habits were back to normal. If so then f/u prn. If sxs returned let me know.  Thx

## 2019-01-15 NOTE — Telephone Encounter (Signed)
Pt called regarding her lab results.

## 2019-01-15 NOTE — Telephone Encounter (Signed)
Patient calling for pancreatic fecal elastase results.  They were normal.  Do we need to make any changes based on this result?

## 2019-01-15 NOTE — Telephone Encounter (Signed)
See other phone note for details. 

## 2019-01-15 NOTE — Progress Notes (Signed)
Normal results See phone note also

## 2019-01-21 ENCOUNTER — Other Ambulatory Visit: Payer: Self-pay | Admitting: Family

## 2019-01-21 ENCOUNTER — Ambulatory Visit (INDEPENDENT_AMBULATORY_CARE_PROVIDER_SITE_OTHER): Payer: Medicare Other | Admitting: Family

## 2019-01-21 DIAGNOSIS — F419 Anxiety disorder, unspecified: Secondary | ICD-10-CM

## 2019-01-21 MED ORDER — ESCITALOPRAM OXALATE 5 MG PO TABS: 5.0000 mg | ORAL_TABLET | Freq: Every day | ORAL | 1 refills | Status: DC

## 2019-01-21 NOTE — Progress Notes (Signed)
Virtual Visit via Video Note  I connected with Madeline Schaefer on 01/21/19 at  5:40 PM EDT by a video enabled telemedicine application and verified that I am speaking with the correct person using two identifiers.  Location: Patient: home Provider: office   I discussed the limitations of evaluation and management by telemedicine and the availability of in person appointments. The patient expressed understanding and agreed to proceed.  History of Present Illness:  Patient is a 73 year old female who presents today to discuss anxiety.  She reports that she has been quite worried about her husband who has been having some behavioral changes as well as some possible memory loss.  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  She feels like she needs something to help "take the edge off."  GAD 7 : Generalized Anxiety Score 01/21/2019  Nervous, Anxious, on Edge 1  Control/stop worrying 3  Worry too much - different things 3  Trouble relaxing 0  Restless 0  Easily annoyed or irritable 1  Afraid - awful might happen 0  Total GAD 7 Score 8  Anxiety Difficulty Not difficult at all   Past Medical History:  Diagnosis Date  . Blood transfusion without reported diagnosis   . BRONCHITIS   . CAD (coronary artery disease)    LAD stent 2003 ([facility]).    . Cataract   . DYSPNEA   . HLD (hyperlipidemia)   . Hypothyroidism   . Mobitz type 2 second degree heart block    s/p PPM by Dr Olevia Perches 2004  . PACEMAKER, PERMANENT      Social History   Socioeconomic History  . Marital status: Married    Spouse name: Not on file  . Number of children: 0  . Years of education: Not on file  . Highest education level: Not on file  Occupational History  . Occupation: retired  Scientific laboratory technician  . Financial resource strain: Not on file  . Food insecurity    Worry: Not on file    Inability: Not on file  . Transportation needs    Medical: Not on file     Non-medical: Not on file  Tobacco Use  . Smoking status: Never Smoker  . Smokeless tobacco: Never Used  Substance and Sexual Activity  . Alcohol use: Yes    Comment: 1-2 drink daily  . Drug use: No  . Sexual activity: Yes  Lifestyle  . Physical activity    Days per week: Not on file    Minutes per session: Not on file  . Stress: Not on file  Relationships  . Social Herbalist on phone: Not on file    Gets together: Not on file    Attends religious service: Not on file    Active member of club or organization: Not on file    Attends meetings of clubs or organizations: Not on file    Relationship status: Not on file  . Intimate partner violence    Fear of current or ex partner: Not on file    Emotionally abused: Not on file    Physically abused: Not on file    Forced sexual activity: Not on file  Other Topics Concern  . Not on file  Social History Narrative   Retired Furniture conservator/restorer   Enjoys RV, bikes   No children   No pets    Past Surgical History:  Procedure Laterality Date  .  CARDIAC CATHETERIZATION    . CYSTOSCOPY WITH RETROGRADE PYELOGRAM, URETEROSCOPY AND STENT PLACEMENT  08/19/2012   Procedure: CYSTOSCOPY WITH RETROGRADE PYELOGRAM, URETEROSCOPY AND STENT PLACEMENT;  Surgeon: Molli Hazard, MD;  Location[facility];  Service: Urology;  Laterality: Right;  . MYOMECTOMY    . PACEMAKER GENERATOR CHANGE N/A 12/22/2011   Procedure: PACEMAKER GENERATOR CHANGE;  Surgeon: Thompson Grayer, MD;  Location: [facility] CATH LAB;  Service: Cardiovascular;  Laterality: N/A;  . PACEMAKER INSERTION  2004, 12/22/11   initial implant by Dr Olevia Perches, Generator change (MDT Adapta L) by Dr Rayann Heman 6/13  . TOTAL ABDOMINAL HYSTERECTOMY W/ BILATERAL SALPINGOOPHORECTOMY      Family History  Problem Relation Age of Onset  . Heart attack Mother        died at age 7  . Breast cancer Mother        age 84, had mastectomy.   . Prostate cancer Father   . Colon  cancer Neg Hx   . Esophageal cancer Neg Hx   . Rectal cancer Neg Hx     Allergies  Allergen Reactions  . Bee Venom Anaphylaxis  . Codeine Other (See Comments)    REACTION: Passed  out, facial swelling and hives REACTION: Passed  out, facial swelling and hives  . Pneumovax 23 [Pneumococcal Vac Polyvalent] Swelling    Local swelling and pain  . Lisinopril     COUGH  . Hornet Venom Hives    Hives over entire body  . Sulfonamide Derivatives Hives    Current Outpatient Medications on File Prior to Visit  Medication Sig Dispense Refill  . aspirin EC 81 MG tablet Take 81 mg by mouth daily.    . carvedilol (COREG) 3.125 MG tablet Take 1 tablet by mouth twice daily 180 tablet 0  . clobetasol ointment (TEMOVATE) 1.44 % Apply 1 application topically daily as needed (eczema).     . dicyclomine (BENTYL) 20 MG tablet Take 20 mg by mouth 2 (two) times daily as needed for spasms.    Marland Kitchen EPINEPHrine 0.3 mg/0.3 mL IJ SOAJ injection Inject 0.3 mLs (0.3 mg total) into the muscle once. 2 Device 1  . ibuprofen (ADVIL) 200 MG tablet Take 2 tablets (400 mg total) by mouth every 8 (eight) hours as needed for moderate pain (with food). 30 tablet 0  . levothyroxine (SYNTHROID, LEVOTHROID) 100 MCG tablet TAKE 1 TABLET BY MOUTH ONCE DAILY BEFORE BREAKFAST 90 tablet 0  . losartan (COZAAR) 25 MG tablet Take 1/2 (one-half) tablet by mouth once daily 45 tablet 0   Current Facility-Administered Medications on File Prior to Visit  Medication Dose Route Frequency Provider Last Rate Last Dose  . triamcinolone acetonide (KENALOG) 10 MG/ML injection 10 mg  10 mg Other Once Landis Martins, DPM        LMP 10/17/1986     Observations/Objective:   Gen: Awake, alert, no acute distress Resp: Breathing is even and non-labored Psych: calm/pleasant demeanor Neuro: Alert and Oriented x 3, + facial symmetry, speech is clear.   Assessment and Plan:  Anxiety- will initiate lexapro 5mg  once daily.  We discussed common  side effects such as nausea, drowsiness and weight gain.   Plan follow up in 1 month to evaluate progress.     Follow Up Instructions:    I discussed the assessment and treatment plan with the patient. The patient was provided an opportunity to ask questions and all were answered. The patient agreed with the plan and demonstrated an understanding  of the instructions.   The patient was advised to call back or seek an in-person evaluation if the symptoms worsen or if the condition fails to improve as anticipated.  Nance Pear, NP

## 2019-01-22 ENCOUNTER — Encounter: Payer: Self-pay | Admitting: Family

## 2019-01-22 ENCOUNTER — Telehealth: Payer: Self-pay | Admitting: Family

## 2019-01-22 NOTE — Telephone Encounter (Signed)
Return for 4-6 weeks for follow up. Called pt for no ans. Left msg

## 2019-01-27 ENCOUNTER — Other Ambulatory Visit: Payer: Self-pay

## 2019-01-27 MED ORDER — DICYCLOMINE HCL 20 MG PO TABS: 20.0000 mg | ORAL_TABLET | Freq: Two times a day (BID) | ORAL | 1 refills | Status: DC | PRN

## 2019-02-11 ENCOUNTER — Ambulatory Visit (INDEPENDENT_AMBULATORY_CARE_PROVIDER_SITE_OTHER): Payer: Medicare Other | Admitting: *Deleted

## 2019-02-11 DIAGNOSIS — I255 Ischemic cardiomyopathy: Secondary | ICD-10-CM

## 2019-02-11 LAB — CUP PACEART REMOTE DEVICE CHECK
Battery Impedance: 757 Ohm
Battery Remaining Longevity: 70 mo
Battery Voltage: 2.78 V
Brady Statistic AP VP Percent: 20 %
Brady Statistic AP VS Percent: 0 %
Brady Statistic AS VP Percent: 80 %
Brady Statistic AS VS Percent: 0 %
Date Time Interrogation Session: 20200728123050
Implantable Lead Implant Date: 20040907
Implantable Lead Implant Date: 20040907
Implantable Lead Location: 753859
Implantable Lead Location: 753860
Implantable Lead Model: 5076
Implantable Lead Model: 5076
Implantable Pulse Generator Implant Date: 20130607
Lead Channel Impedance Value: 456 Ohm
Lead Channel Impedance Value: 614 Ohm
Lead Channel Pacing Threshold Amplitude: 0.625 V
Lead Channel Pacing Threshold Amplitude: 0.75 V
Lead Channel Pacing Threshold Pulse Width: 0.4 ms
Lead Channel Pacing Threshold Pulse Width: 0.4 ms
Lead Channel Setting Pacing Amplitude: 2 V
Lead Channel Setting Pacing Amplitude: 2.5 V
Lead Channel Setting Pacing Pulse Width: 0.4 ms
Lead Channel Setting Sensing Sensitivity: 2 mV

## 2019-02-24 ENCOUNTER — Encounter: Payer: Self-pay | Admitting: Cardiology

## 2019-02-24 NOTE — Progress Notes (Signed)
Remote pacemaker transmission.   

## 2019-02-28 ENCOUNTER — Telehealth: Payer: Self-pay

## 2019-02-28 NOTE — Telephone Encounter (Signed)
Left a message regarding appt on 03/03/19.

## 2019-03-03 ENCOUNTER — Telehealth (INDEPENDENT_AMBULATORY_CARE_PROVIDER_SITE_OTHER): Payer: Medicare Other | Admitting: Internal Medicine

## 2019-03-03 DIAGNOSIS — I441 Atrioventricular block, second degree: Secondary | ICD-10-CM

## 2019-03-03 DIAGNOSIS — I10 Essential (primary) hypertension: Secondary | ICD-10-CM

## 2019-03-03 NOTE — Progress Notes (Signed)
Electrophysiology TeleHealth Note  Due to national recommendations of social distancing due to [place] 19, an audio telehealth visit is felt to be most appropriate for this patient at this time.  Verbal consent was obtained by me for the telehealth visit today.  The patient does not have capability for a virtual visit.  A phone visit is therefore required today.   Date:  03/03/2019   ID:  Madeline Schaefer, DOB 05-11-1946, MRN [id number]  Location: patient's home  Provider location:  [facility]  Evaluation Performed: Follow-up visit  PCP:  [name], NP   Electrophysiologist:  Dr Rayann Heman  Chief Complaint:  palpitations  History of Present Illness:    Madeline Schaefer is a 73 y.o. female who presents via telehealth conferencing today.  Since last being seen in our clinic, the patient reports doing very well.  Today, she denies symptoms of palpitations, chest pain, shortness of breath,  lower extremity edema, dizziness, presyncope, or syncope.  The patient is otherwise without complaint today.  The patient denies symptoms of fevers, chills, cough, or new SOB worrisome for COVID 19.  Past Medical History:  Diagnosis Date  . Blood transfusion without reported diagnosis   . BRONCHITIS   . CAD (coronary artery disease)    LAD stent 2003 ([facility]).    . Cataract   . DYSPNEA   . HLD (hyperlipidemia)   . Hypothyroidism   . Mobitz type 2 second degree heart block    s/p PPM by Dr Olevia Perches 2004  . PACEMAKER, PERMANENT     Past Surgical History:  Procedure Laterality Date  . CARDIAC CATHETERIZATION    . CYSTOSCOPY WITH RETROGRADE PYELOGRAM, URETEROSCOPY AND STENT PLACEMENT  08/19/2012   Procedure: CYSTOSCOPY WITH RETROGRADE PYELOGRAM, URETEROSCOPY AND STENT PLACEMENT;  Surgeon: Molli Hazard, MD;  Location: [facility];  Service: Urology;  Laterality: Right;  . MYOMECTOMY    . PACEMAKER GENERATOR CHANGE N/A 12/22/2011   Procedure: PACEMAKER GENERATOR  CHANGE;  Surgeon: Thompson Grayer, MD;  Location: [facility] CATH LAB;  Service: Cardiovascular;  Laterality: N/A;  . PACEMAKER INSERTION  2004, 12/22/11   initial implant by Dr Olevia Perches, Generator change (MDT Adapta L) by Dr Rayann Heman 6/13  . TOTAL ABDOMINAL HYSTERECTOMY W/ BILATERAL SALPINGOOPHORECTOMY      Current Outpatient Medications  Medication Sig Dispense Refill  . aspirin EC 81 MG tablet Take 81 mg by mouth daily.    Marland Kitchen atorvastatin (LIPITOR) 80 MG tablet Take 1 tablet by mouth once daily 90 tablet 1  . carvedilol (COREG) 3.125 MG tablet Take 1 tablet by mouth twice daily 180 tablet 0  . clobetasol ointment (TEMOVATE) 9.16 % Apply 1 application topically daily as needed (eczema).     . dicyclomine (BENTYL) 20 MG tablet Take 1 tablet (20 mg total) by mouth 2 (two) times daily as needed for spasms. 60 tablet 1  . EPINEPHrine 0.3 mg/0.3 mL IJ SOAJ injection Inject 0.3 mLs (0.3 mg total) into the muscle once. 2 Device 1  . escitalopram (LEXAPRO) 5 MG tablet Take 1 tablet (5 mg total) by mouth daily. 30 tablet 1  . ibuprofen (ADVIL) 200 MG tablet Take 2 tablets (400 mg total) by mouth every 8 (eight) hours as needed for moderate pain (with food). 30 tablet 0  . levothyroxine (SYNTHROID, LEVOTHROID) 100 MCG tablet TAKE 1 TABLET BY MOUTH ONCE DAILY BEFORE BREAKFAST 90 tablet 0  . losartan (COZAAR) 25 MG tablet Take 1/2 (one-half) tablet by mouth once  daily 45 tablet 0   Current Facility-Administered Medications  Medication Dose Route Frequency Provider Last Rate Last Dose  . triamcinolone acetonide (KENALOG) 10 MG/ML injection 10 mg  10 mg Other Once Landis Martins, DPM        Allergies:   Bee venom, Codeine, Pneumovax 23 [pneumococcal vac polyvalent], Lisinopril, Hornet venom, and Sulfonamide derivatives   Social History:  The patient  reports that she has never smoked. She has never used smokeless tobacco. She reports current alcohol use. She reports that she does not use drugs.   Family History:  The  patient's  family history includes Breast cancer in her mother; Heart attack in her mother; Prostate cancer in her father.   ROS:  Please see the history of present illness.   All other systems are personally reviewed and negative.   Echo 12/11/2018- EF 50-55%, moderate TR   Exam:    Vital Signs:  LMP 10/17/1986   Well sounding  Labs/Other Tests and Data Reviewed:    Recent Labs: 11/21/2018: ALT 17; BUN 16; Creatinine, Ser 0.86; Hemoglobin 14.4; Platelets 248.0; Potassium 5.0; Sodium 139; TSH 4.04   Wt Readings from Last 3 Encounters:  11/21/18 150 lb (68 kg)  06/03/18 170 lb (77.1 kg)  05/30/18 170 lb (77.1 kg)     Last device remote is reviewed from [facility] PDF which reveals normal device function, no arrhythmias    ASSESSMENT & PLAN:    1.  Second degree AV block Normal pacemaker function Remotes are up to date  2. HTN Stable No change required today  3. CAD No ischemic symptoms EF is normal by recent echo  4. atach Very low burden  Follow-up:  carelink Return to see me in a year    Patient Risk:  after full review of this patients clinical status, I feel that they are at moderate risk at this time.  Today, I have spent 15 minutes with the patient with telehealth technology discussing arrhythmia management .    Army Fossa, MD  03/03/2019 10:34 AM     [facility] [address] [place] [address] [phone] (office) [phone] (fax)

## 2019-03-05 ENCOUNTER — Ambulatory Visit (INDEPENDENT_AMBULATORY_CARE_PROVIDER_SITE_OTHER): Payer: Medicare Other | Admitting: Family

## 2019-03-05 ENCOUNTER — Other Ambulatory Visit: Payer: Self-pay

## 2019-03-05 DIAGNOSIS — F419 Anxiety disorder, unspecified: Secondary | ICD-10-CM

## 2019-03-05 DIAGNOSIS — I10 Essential (primary) hypertension: Secondary | ICD-10-CM | POA: Diagnosis not present

## 2019-03-05 MED ORDER — ESCITALOPRAM OXALATE 10 MG PO TABS: 10.0000 mg | ORAL_TABLET | Freq: Every day | ORAL | 0 refills | Status: DC

## 2019-03-05 NOTE — Progress Notes (Signed)
Virtual Visit via Video Note  I connected with Madeline Schaefer on 03/05/19 at  9:00 AM EDT by a video enabled telemedicine application and verified that I am speaking with the correct person using two identifiers.  Location: Patient: home Provider: home   I discussed the limitations of evaluation and management by telemedicine and the availability of in person appointments. The patient expressed understanding and agreed to proceed.  History of Present Illness:  Patient is a 73 yr old female who presents today for follow up. Last visit she noted increased anxiety. She was especially worried about her husband who was exhibiting some odd behaviors/memory loss. Since last visit her husband has been diagnosed with "mild cognitive impairment." She continues to worry about him but notes that overall since she started the lexapro her diarrhea has improved and she wonders if the diarrhea was "due to my nerves." She denies any side effects from the lexapro.  HTN- reports that she has been monitoring her bp at home.  124/74 on Monday 116/73 on Tuesday  Past Medical History:  Diagnosis Date  . Blood transfusion without reported diagnosis   . BRONCHITIS   . CAD (coronary artery disease)    LAD stent 2003 ([facility]).    . Cataract   . DYSPNEA   . HLD (hyperlipidemia)   . Hypothyroidism   . Mobitz type 2 second degree heart block    s/p PPM by Dr Olevia Perches 2004  . PACEMAKER, PERMANENT      Social History   Socioeconomic History  . Marital status: Married    Spouse name: Not on file  . Number of children: 0  . Years of education: Not on file  . Highest education level: Not on file  Occupational History  . Occupation: retired  Scientific laboratory technician  . Financial resource strain: Not on file  . Food insecurity    Worry: Not on file    Inability: Not on file  . Transportation needs    Medical: Not on file    Non-medical: Not on file  Tobacco Use  . Smoking status: Never Smoker  . Smokeless  tobacco: Never Used  Substance and Sexual Activity  . Alcohol use: Yes    Comment: 1-2 drink daily  . Drug use: No  . Sexual activity: Yes  Lifestyle  . Physical activity    Days per week: Not on file    Minutes per session: Not on file  . Stress: Not on file  Relationships  . Social Herbalist on phone: Not on file    Gets together: Not on file    Attends religious service: Not on file    Active member of club or organization: Not on file    Attends meetings of clubs or organizations: Not on file    Relationship status: Not on file  . Intimate partner violence    Fear of current or ex partner: Not on file    Emotionally abused: Not on file    Physically abused: Not on file    Forced sexual activity: Not on file  Other Topics Concern  . Not on file  Social History Narrative   Retired Furniture conservator/restorer   Enjoys RV, bikes   No children   No pets    Past Surgical History:  Procedure Laterality Date  . CARDIAC CATHETERIZATION    . CYSTOSCOPY WITH RETROGRADE PYELOGRAM, URETEROSCOPY AND STENT PLACEMENT  08/19/2012   Procedure: CYSTOSCOPY WITH RETROGRADE PYELOGRAM, URETEROSCOPY AND  STENT PLACEMENT;  Surgeon: Molli Hazard, MD;  Location: [facility];  Service: Urology;  Laterality: Right;  . MYOMECTOMY    . PACEMAKER GENERATOR CHANGE N/A 12/22/2011   Procedure: PACEMAKER GENERATOR CHANGE;  Surgeon: Thompson Grayer, MD;  Location: [facility] CATH LAB;  Service: Cardiovascular;  Laterality: N/A;  . PACEMAKER INSERTION  2004, 12/22/11   initial implant by Dr Olevia Perches, Generator change (MDT Adapta L) by Dr Rayann Heman 6/13  . TOTAL ABDOMINAL HYSTERECTOMY W/ BILATERAL SALPINGOOPHORECTOMY      Family History  Problem Relation Age of Onset  . Heart attack Mother        died at age 79  . Breast cancer Mother        age 65, had mastectomy.   . Prostate cancer Father   . Colon cancer Neg Hx   . Esophageal cancer Neg Hx   . Rectal cancer Neg Hx     Allergies   Allergen Reactions  . Bee Venom Anaphylaxis  . Codeine Other (See Comments)    REACTION: Passed  out, facial swelling and hives REACTION: Passed  out, facial swelling and hives  . Pneumovax 23 [Pneumococcal Vac Polyvalent] Swelling    Local swelling and pain  . Lisinopril     COUGH  . Hornet Venom Hives    Hives over entire body  . Sulfonamide Derivatives Hives    Current Outpatient Medications on File Prior to Visit  Medication Sig Dispense Refill  . aspirin EC 81 MG tablet Take 81 mg by mouth daily.    Marland Kitchen atorvastatin (LIPITOR) 80 MG tablet Take 1 tablet by mouth once daily 90 tablet 1  . carvedilol (COREG) 3.125 MG tablet Take 1 tablet by mouth twice daily 180 tablet 0  . clobetasol ointment (TEMOVATE) 4.08 % Apply 1 application topically daily as needed (eczema).     . dicyclomine (BENTYL) 20 MG tablet Take 1 tablet (20 mg total) by mouth 2 (two) times daily as needed for spasms. 60 tablet 1  . EPINEPHrine 0.3 mg/0.3 mL IJ SOAJ injection Inject 0.3 mLs (0.3 mg total) into the muscle once. 2 Device 1  . ibuprofen (ADVIL) 200 MG tablet Take 2 tablets (400 mg total) by mouth every 8 (eight) hours as needed for moderate pain (with food). 30 tablet 0  . levothyroxine (SYNTHROID, LEVOTHROID) 100 MCG tablet TAKE 1 TABLET BY MOUTH ONCE DAILY BEFORE BREAKFAST 90 tablet 0  . losartan (COZAAR) 25 MG tablet Take 1/2 (one-half) tablet by mouth once daily 45 tablet 0   Current Facility-Administered Medications on File Prior to Visit  Medication Dose Route Frequency Provider Last Rate Last Dose  . triamcinolone acetonide (KENALOG) 10 MG/ML injection 10 mg  10 mg Other Once Landis Martins, DPM        LMP 10/17/1986        Observations/Objective:   Gen: Awake, alert, no acute distress Resp: Breathing is even and non-labored Psych: pleasant demeanor, mildly anxious appearing Neuro: Alert and Oriented x 3, + facial symmetry, speech is clear.   Assessment and Plan:  Anxiety-  improving.  I think she would respond well to in increase in her lexapro from 5mg  to 10mg .    HTN- bp stable, continue losartan and carvedilol.   Follow Up Instructions:    I discussed the assessment and treatment plan with the patient. The patient was provided an opportunity to ask questions and all were answered. The patient agreed with the plan and demonstrated an understanding of the  instructions.   The patient was advised to call back or seek an in-person evaluation if the symptoms worsen or if the condition fails to improve as anticipated.  Nance Pear, NP

## 2019-04-15 ENCOUNTER — Other Ambulatory Visit: Payer: Self-pay | Admitting: Family

## 2019-04-16 ENCOUNTER — Other Ambulatory Visit: Payer: Self-pay

## 2019-04-16 ENCOUNTER — Telehealth (INDEPENDENT_AMBULATORY_CARE_PROVIDER_SITE_OTHER): Payer: Medicare Other | Admitting: Family

## 2019-04-16 DIAGNOSIS — F419 Anxiety disorder, unspecified: Secondary | ICD-10-CM

## 2019-04-16 MED ORDER — ESCITALOPRAM OXALATE 10 MG PO TABS: 10.0000 mg | ORAL_TABLET | Freq: Every day | ORAL | 0 refills | Status: DC

## 2019-04-16 MED ORDER — LEVOTHYROXINE SODIUM 100 MCG PO TABS: ORAL_TABLET | ORAL | 0 refills | Status: DC

## 2019-04-16 NOTE — Telephone Encounter (Signed)
Patient scheduled for video visit today.

## 2019-04-16 NOTE — Progress Notes (Signed)
Virtual Visit via Telephone Note  I connected with Madeline Schaefer on 04/16/19 at 12:20 PM EDT by a telephone and verified that I am speaking with the correct person using two identifiers.  Location: Patient: car Provider: home   I discussed the limitations of evaluation and management by telemedicine and the availability of in person appointments. The patient expressed understanding and agreed to proceed.  History of Present Illness:  Patient is a 73 yr old female who presents today for follow up of her anxiety. Last visit she noted some improvement in her anxiety but was having persistent symptoms. We increased her lexapro from 5mg  to 10mg  once daily. She notes overall that she seems to be doing better handling stress. She continues to worry about her husband's issues with memory loss but notes that they are scheduled to have a second opinion with neurology and she is hoping that she will learn more about his health and that it will help her anxiety more.     Past Medical History:  Diagnosis Date  . Blood transfusion without reported diagnosis   . BRONCHITIS   . CAD (coronary artery disease)    LAD stent 2003 ([facility]).    . Cataract   . DYSPNEA   . HLD (hyperlipidemia)   . Hypothyroidism   . Mobitz type 2 second degree heart block    s/p PPM by Dr Olevia Perches 2004  . PACEMAKER, PERMANENT      Social History   Socioeconomic History  . Marital status: Married    Spouse name: Not on file  . Number of children: 0  . Years of education: Not on file  . Highest education level: Not on file  Occupational History  . Occupation: retired  Scientific laboratory technician  . Financial resource strain: Not on file  . Food insecurity    Worry: Not on file    Inability: Not on file  . Transportation needs    Medical: Not on file    Non-medical: Not on file  Tobacco Use  . Smoking status: Never Smoker  . Smokeless tobacco: Never Used  Substance and Sexual Activity  . Alcohol use: Yes    Comment: 1-2  drink daily  . Drug use: No  . Sexual activity: Yes  Lifestyle  . Physical activity    Days per week: Not on file    Minutes per session: Not on file  . Stress: Not on file  Relationships  . Social Herbalist on phone: Not on file    Gets together: Not on file    Attends religious service: Not on file    Active member of club or organization: Not on file    Attends meetings of clubs or organizations: Not on file    Relationship status: Not on file  . Intimate partner violence    Fear of current or ex partner: Not on file    Emotionally abused: Not on file    Physically abused: Not on file    Forced sexual activity: Not on file  Other Topics Concern  . Not on file  Social History Narrative   Retired Furniture conservator/restorer   Enjoys RV, bikes   No children   No pets    Past Surgical History:  Procedure Laterality Date  . CARDIAC CATHETERIZATION    . CYSTOSCOPY WITH RETROGRADE PYELOGRAM, URETEROSCOPY AND STENT PLACEMENT  08/19/2012   Procedure: CYSTOSCOPY WITH RETROGRADE PYELOGRAM, URETEROSCOPY AND STENT PLACEMENT;  Surgeon: Molli Hazard,  MD;  Location: [facility];  Service: Urology;  Laterality: Right;  . MYOMECTOMY    . PACEMAKER GENERATOR CHANGE N/A 12/22/2011   Procedure: PACEMAKER GENERATOR CHANGE;  Surgeon: Thompson Grayer, MD;  Location: [facility] CATH LAB;  Service: Cardiovascular;  Laterality: N/A;  . PACEMAKER INSERTION  2004, 12/22/11   initial implant by Dr Olevia Perches, Generator change (MDT Adapta L) by Dr Rayann Heman 6/13  . TOTAL ABDOMINAL HYSTERECTOMY W/ BILATERAL SALPINGOOPHORECTOMY      Family History  Problem Relation Age of Onset  . Heart attack Mother        died at age 72  . Breast cancer Mother        age 24, had mastectomy.   . Prostate cancer Father   . Colon cancer Neg Hx   . Esophageal cancer Neg Hx   . Rectal cancer Neg Hx     Allergies  Allergen Reactions  . Bee Venom Anaphylaxis  . Codeine Other (See Comments)    REACTION:  Passed  out, facial swelling and hives REACTION: Passed  out, facial swelling and hives  . Pneumovax 23 [Pneumococcal Vac Polyvalent] Swelling    Local swelling and pain  . Lisinopril     COUGH  . Hornet Venom Hives    Hives over entire body  . Sulfonamide Derivatives Hives    Current Outpatient Medications on File Prior to Visit  Medication Sig Dispense Refill  . aspirin EC 81 MG tablet Take 81 mg by mouth daily.    Marland Kitchen atorvastatin (LIPITOR) 80 MG tablet Take 1 tablet by mouth once daily 90 tablet 1  . carvedilol (COREG) 3.125 MG tablet Take 1 tablet by mouth twice daily 180 tablet 0  . clobetasol ointment (TEMOVATE) [id number] % Apply 1 application topically daily as needed (eczema).     . dicyclomine (BENTYL) 20 MG tablet Take 1 tablet (20 mg total) by mouth 2 (two) times daily as needed for spasms. 60 tablet 1  . EPINEPHrine 0.3 mg/0.3 mL IJ SOAJ injection Inject 0.3 mLs (0.3 mg total) into the muscle once. 2 Device 1  . ibuprofen (ADVIL) 200 MG tablet Take 2 tablets (400 mg total) by mouth every 8 (eight) hours as needed for moderate pain (with food). 30 tablet 0  . losartan (COZAAR) 25 MG tablet Take 1/2 (one-half) tablet by mouth once daily 45 tablet 0   Current Facility-Administered Medications on File Prior to Visit  Medication Dose Route Frequency Provider Last Rate Last Dose  . triamcinolone acetonide (KENALOG) 10 MG/ML injection 10 mg  10 mg Other Once Landis Martins, DPM        LMP 10/17/1986      Observations/Objective:   Gen: Awake, alert Resp: Breathing sounds even and non-labored Psych: calm/pleasant demeanor Neuro: Alert and Oriented x 3, speech sounds is clear.   Assessment and Plan:  Anxiety- improved on current dose of lexapro. Will continue same. She is advised to follow up in 6 months- sooner if problems/concerns.  Follow Up Instructions:    I discussed the assessment and treatment plan with the patient. The patient was provided an opportunity to ask  questions and all were answered. The patient agreed with the plan and demonstrated an understanding of the instructions.   The patient was advised to call back or seek an in-person evaluation if the symptoms worsen or if the condition fails to improve as anticipated.  11 minutes spent on today's call.    Nance Pear, NP

## 2019-04-16 NOTE — Telephone Encounter (Signed)
[name] -- I sent a 90 day supply of levothyroxine to the pharmacy.  Pt had TSH checked last in May with GI. Pt saw you 03/05/19 but I did not see a return follow up for pt. When should pt have next OV or lab check?

## 2019-04-16 NOTE — Telephone Encounter (Signed)
Please contact pt to arrange a follow up check of her lexapro adjustment. We can do a virtual visit.

## 2019-04-22 ENCOUNTER — Other Ambulatory Visit: Payer: Self-pay | Admitting: Internal Medicine

## 2019-04-22 DIAGNOSIS — I255 Ischemic cardiomyopathy: Secondary | ICD-10-CM

## 2019-04-22 MED ORDER — CARVEDILOL 3.125 MG PO TABS: 3.1250 mg | ORAL_TABLET | Freq: Two times a day (BID) | ORAL | 3 refills | Status: DC

## 2019-04-22 NOTE — Telephone Encounter (Signed)
Pt's medication was sent to pt's pharmacy as requested. Confirmation received.  °

## 2019-04-22 NOTE — Telephone Encounter (Signed)
New Message   *STAT* If patient is at the pharmacy, call can be transferred to refill team.   1. Which medications need to be refilled? (please list name of each medication and dose if known) carvedilol (COREG) 3.125 MG tablet  2. Which pharmacy/location (including street and city if local pharmacy) is medication to be sent to? [place], [place]  3. Do they need a 30 day or 90 day supply? 90 day

## 2019-05-13 ENCOUNTER — Encounter: Payer: Medicare Other | Admitting: *Deleted

## 2019-05-20 ENCOUNTER — Other Ambulatory Visit: Payer: Self-pay | Admitting: Family

## 2019-05-27 ENCOUNTER — Encounter: Payer: Self-pay | Admitting: Family

## 2019-06-11 ENCOUNTER — Ambulatory Visit (INDEPENDENT_AMBULATORY_CARE_PROVIDER_SITE_OTHER): Payer: Medicare Other | Admitting: *Deleted

## 2019-06-11 DIAGNOSIS — I441 Atrioventricular block, second degree: Secondary | ICD-10-CM

## 2019-06-12 LAB — CUP PACEART REMOTE DEVICE CHECK
Battery Impedance: 860 Ohm
Battery Remaining Longevity: 67 mo
Battery Voltage: 2.77 V
Brady Statistic AP VP Percent: 22 %
Brady Statistic AP VS Percent: 0 %
Brady Statistic AS VP Percent: 78 %
Brady Statistic AS VS Percent: 0 %
Date Time Interrogation Session: 20201125152136
Implantable Lead Implant Date: 20040907
Implantable Lead Implant Date: 20040907
Implantable Lead Location: 753859
Implantable Lead Location: 753860
Implantable Lead Model: 5076
Implantable Lead Model: 5076
Implantable Pulse Generator Implant Date: 20130607
Lead Channel Impedance Value: 456 Ohm
Lead Channel Impedance Value: 711 Ohm
Lead Channel Pacing Threshold Amplitude: 0.625 V
Lead Channel Pacing Threshold Amplitude: 0.875 V
Lead Channel Pacing Threshold Pulse Width: 0.4 ms
Lead Channel Pacing Threshold Pulse Width: 0.4 ms
Lead Channel Setting Pacing Amplitude: 2 V
Lead Channel Setting Pacing Amplitude: 2.5 V
Lead Channel Setting Pacing Pulse Width: 0.4 ms
Lead Channel Setting Sensing Sensitivity: 2 mV

## 2019-06-13 ENCOUNTER — Other Ambulatory Visit: Payer: Self-pay | Admitting: Internal Medicine

## 2019-07-13 ENCOUNTER — Encounter: Payer: Self-pay | Admitting: Family

## 2019-07-13 ENCOUNTER — Other Ambulatory Visit: Payer: Self-pay | Admitting: Family

## 2019-07-13 MED ORDER — LEVOTHYROXINE SODIUM 100 MCG PO TABS: 100.0000 ug | ORAL_TABLET | Freq: Every day | ORAL | 0 refills | Status: DC

## 2019-07-13 NOTE — Addendum Note (Signed)
Addended by: [name] on: 07/13/2019 10:51 AM   Modules accepted: Orders

## 2019-07-15 ENCOUNTER — Telehealth: Payer: Self-pay | Admitting: Medical

## 2019-07-15 ENCOUNTER — Encounter: Payer: Self-pay | Admitting: Family

## 2019-07-15 DIAGNOSIS — E785 Hyperlipidemia, unspecified: Secondary | ICD-10-CM

## 2019-07-15 DIAGNOSIS — E039 Hypothyroidism, unspecified: Secondary | ICD-10-CM

## 2019-07-15 NOTE — Telephone Encounter (Signed)
Future labs placed. 

## 2019-07-16 ENCOUNTER — Other Ambulatory Visit (INDEPENDENT_AMBULATORY_CARE_PROVIDER_SITE_OTHER): Payer: Medicare Other

## 2019-07-16 ENCOUNTER — Telehealth: Payer: Self-pay | Admitting: *Deleted

## 2019-07-16 ENCOUNTER — Other Ambulatory Visit: Payer: Self-pay

## 2019-07-16 DIAGNOSIS — E039 Hypothyroidism, unspecified: Secondary | ICD-10-CM

## 2019-07-16 DIAGNOSIS — E785 Hyperlipidemia, unspecified: Secondary | ICD-10-CM

## 2019-07-16 NOTE — Telephone Encounter (Signed)
[facility] called and stated that for levothyroxine the manufacturer they have in no longer available and they are wanting to know if they could dispense the available manufacturer that they have in stock.    Spoke with patient she is ok with change.  She is going out of town and would like to see is this can be okayed before she goes.  Will you be able to help in Melissas absence.

## 2019-07-16 NOTE — Telephone Encounter (Signed)
Ok with change of manufacturer.

## 2019-07-16 NOTE — Telephone Encounter (Signed)
Margaret at [facility] notified that change is ok per Percell Miller.

## 2019-07-17 LAB — LIPID PANEL
Cholesterol: 159 mg/dL (ref 0–200)
HDL: 48.1 mg/dL (ref >39.00)
LDL Cholesterol: 91 mg/dL (ref 0–99)
NonHDL: 110.76
Total CHOL/HDL Ratio: 3
Triglycerides: 99 mg/dL (ref 0.0–149.0)
VLDL: 19.8 mg/dL (ref 0.0–40.0)

## 2019-07-17 LAB — TSH: TSH: 2.18 u[IU]/mL (ref 0.35–4.50)

## 2019-08-12 ENCOUNTER — Encounter: Payer: Self-pay | Admitting: Family

## 2019-08-12 MED ORDER — LOSARTAN POTASSIUM 25 MG PO TABS: ORAL_TABLET | ORAL | 1 refills | Status: DC

## 2019-08-12 MED ORDER — ATORVASTATIN CALCIUM 80 MG PO TABS: 80.0000 mg | ORAL_TABLET | Freq: Every day | ORAL | 1 refills | Status: DC

## 2019-09-03 ENCOUNTER — Encounter: Payer: Self-pay | Admitting: Family

## 2019-09-04 MED ORDER — ESCITALOPRAM OXALATE 10 MG PO TABS: 10.0000 mg | ORAL_TABLET | Freq: Every day | ORAL | 0 refills | Status: DC

## 2019-09-10 ENCOUNTER — Ambulatory Visit (INDEPENDENT_AMBULATORY_CARE_PROVIDER_SITE_OTHER): Payer: Medicare PPO | Admitting: *Deleted

## 2019-09-10 DIAGNOSIS — I441 Atrioventricular block, second degree: Secondary | ICD-10-CM

## 2019-09-11 LAB — CUP PACEART REMOTE DEVICE CHECK
Battery Impedance: 937 Ohm
Battery Remaining Longevity: 63 mo
Battery Voltage: 2.78 V
Brady Statistic AP VP Percent: 24 %
Brady Statistic AP VS Percent: 0 %
Brady Statistic AS VP Percent: 76 %
Brady Statistic AS VS Percent: 0 %
Date Time Interrogation Session: 20210225115014
Implantable Lead Implant Date: 20040907
Implantable Lead Implant Date: 20040907
Implantable Lead Location: 753859
Implantable Lead Location: 753860
Implantable Lead Model: 5076
Implantable Lead Model: 5076
Implantable Pulse Generator Implant Date: 20130607
Lead Channel Impedance Value: 456 Ohm
Lead Channel Impedance Value: 709 Ohm
Lead Channel Pacing Threshold Amplitude: 0.5 V
Lead Channel Pacing Threshold Amplitude: 0.75 V
Lead Channel Pacing Threshold Pulse Width: 0.4 ms
Lead Channel Pacing Threshold Pulse Width: 0.4 ms
Lead Channel Setting Pacing Amplitude: 2 V
Lead Channel Setting Pacing Amplitude: 2.5 V
Lead Channel Setting Pacing Pulse Width: 0.4 ms
Lead Channel Setting Sensing Sensitivity: 2 mV

## 2019-10-12 ENCOUNTER — Other Ambulatory Visit: Payer: Self-pay | Admitting: Family

## 2019-10-13 ENCOUNTER — Telehealth: Payer: Self-pay | Admitting: Cardiology

## 2019-10-13 MED ORDER — ESCITALOPRAM OXALATE 10 MG PO TABS: 10.0000 mg | ORAL_TABLET | Freq: Every day | ORAL | 0 refills | Status: DC

## 2019-10-13 NOTE — Telephone Encounter (Signed)
Contacted patient- explained it would be to follow up with Dr.Hochrein since that is her primary cardiologist.  Left call back number to call and schedule.

## 2019-10-13 NOTE — Telephone Encounter (Signed)
Patient called and wanted to know if she needed to follow up with Dr. Percival Spanish for her leaky valve or if Dr. Rayann Heman would be the one to monitor that . Please advise

## 2019-10-27 ENCOUNTER — Other Ambulatory Visit: Payer: Self-pay | Admitting: Internal Medicine

## 2019-11-15 ENCOUNTER — Other Ambulatory Visit: Payer: Self-pay | Admitting: Family

## 2019-11-17 LAB — HM MAMMOGRAPHY

## 2019-11-17 MED ORDER — LOSARTAN POTASSIUM 25 MG PO TABS: ORAL_TABLET | ORAL | 1 refills | Status: DC

## 2019-11-21 ENCOUNTER — Telehealth: Payer: Self-pay | Admitting: Family

## 2019-11-21 NOTE — Telephone Encounter (Signed)
Please let pt know that the radiologist would like her to complete some additional breast images for further evaluation. Let me know if she has not been contacted by them about a follow up appointment in 1 week.   

## 2019-11-21 NOTE — Telephone Encounter (Signed)
Patient  Reports she has been called and is scheduled for additional images

## 2019-11-27 LAB — HM MAMMOGRAPHY

## 2019-12-10 ENCOUNTER — Ambulatory Visit (INDEPENDENT_AMBULATORY_CARE_PROVIDER_SITE_OTHER): Payer: Medicare PPO | Admitting: *Deleted

## 2019-12-10 DIAGNOSIS — I441 Atrioventricular block, second degree: Secondary | ICD-10-CM

## 2019-12-11 LAB — CUP PACEART REMOTE DEVICE CHECK
Battery Impedance: 1018 Ohm
Battery Remaining Longevity: 60 mo
Battery Voltage: 2.77 V
Brady Statistic AP VP Percent: 26 %
Brady Statistic AP VS Percent: 0 %
Brady Statistic AS VP Percent: 74 %
Brady Statistic AS VS Percent: 0 %
Date Time Interrogation Session: 20210527094008
Implantable Lead Implant Date: 20040907
Implantable Lead Implant Date: 20040907
Implantable Lead Location: 753859
Implantable Lead Location: 753860
Implantable Lead Model: 5076
Implantable Lead Model: 5076
Implantable Pulse Generator Implant Date: 20130607
Lead Channel Impedance Value: 388 Ohm
Lead Channel Impedance Value: 728 Ohm
Lead Channel Pacing Threshold Amplitude: 0.5 V
Lead Channel Pacing Threshold Amplitude: 0.875 V
Lead Channel Pacing Threshold Pulse Width: 0.4 ms
Lead Channel Pacing Threshold Pulse Width: 0.4 ms
Lead Channel Setting Pacing Amplitude: 2 V
Lead Channel Setting Pacing Amplitude: 2.5 V
Lead Channel Setting Pacing Pulse Width: 0.4 ms
Lead Channel Setting Sensing Sensitivity: 2 mV

## 2019-12-12 ENCOUNTER — Ambulatory Visit: Payer: Medicare PPO | Admitting: Family

## 2019-12-12 ENCOUNTER — Encounter: Payer: Self-pay | Admitting: Family

## 2019-12-12 NOTE — Progress Notes (Signed)
Remote pacemaker transmission.   

## 2019-12-16 ENCOUNTER — Encounter: Payer: Self-pay | Admitting: Family

## 2019-12-18 ENCOUNTER — Encounter: Payer: Self-pay | Admitting: Family

## 2019-12-19 NOTE — Telephone Encounter (Signed)
Scheduled for Monday  °

## 2019-12-19 NOTE — Telephone Encounter (Signed)
Patient scheduled for 12-22-19

## 2019-12-22 ENCOUNTER — Encounter: Payer: Self-pay | Admitting: Family

## 2019-12-22 ENCOUNTER — Ambulatory Visit: Payer: Medicare PPO | Admitting: Family

## 2019-12-22 ENCOUNTER — Telehealth: Payer: Self-pay | Admitting: Family

## 2019-12-22 ENCOUNTER — Other Ambulatory Visit: Payer: Self-pay

## 2019-12-22 VITALS — BP 155/71 | HR 60 | Temp 97.6°F | Resp 16 | Ht 70.0 in | Wt 163.0 lb

## 2019-12-22 DIAGNOSIS — K589 Irritable bowel syndrome without diarrhea: Secondary | ICD-10-CM

## 2019-12-22 DIAGNOSIS — F419 Anxiety disorder, unspecified: Secondary | ICD-10-CM

## 2019-12-22 DIAGNOSIS — E039 Hypothyroidism, unspecified: Secondary | ICD-10-CM

## 2019-12-22 DIAGNOSIS — L309 Dermatitis, unspecified: Secondary | ICD-10-CM

## 2019-12-22 DIAGNOSIS — I10 Essential (primary) hypertension: Secondary | ICD-10-CM

## 2019-12-22 DIAGNOSIS — E785 Hyperlipidemia, unspecified: Secondary | ICD-10-CM

## 2019-12-22 LAB — BASIC METABOLIC PANEL
BUN: 20 mg/dL (ref 6–23)
CO2: 30 mEq/L (ref 19–32)
Calcium: 8.9 mg/dL (ref 8.4–10.5)
Chloride: 102 mEq/L (ref 96–112)
Creatinine, Ser: 0.77 mg/dL (ref 0.40–1.20)
GFR: 73.39 mL/min (ref >60.00)
Glucose, Bld: 100 mg/dL — ABNORMAL HIGH (ref 70–99)
Potassium: 4.4 mEq/L (ref 3.5–5.1)
Sodium: 136 mEq/L (ref 135–145)

## 2019-12-22 LAB — TSH: TSH: 9.75 u[IU]/mL — ABNORMAL HIGH (ref 0.35–4.50)

## 2019-12-22 MED ORDER — LEVOTHYROXINE SODIUM 112 MCG PO TABS: 112.0000 ug | ORAL_TABLET | Freq: Every day | ORAL | 0 refills | Status: DC

## 2019-12-22 MED ORDER — LOSARTAN POTASSIUM 25 MG PO TABS: 25.0000 mg | ORAL_TABLET | Freq: Every day | ORAL | 1 refills | Status: DC

## 2019-12-22 NOTE — Patient Instructions (Addendum)
Please increase losartan to a full tab once daily.  Please complete lab work prior to leaving.

## 2019-12-22 NOTE — Telephone Encounter (Signed)
Please advise pt that her lab work shows thyroid medicine needs to be increased. Please change synthroid to 112 mcg and plan to repeat tsh in 6 weeks.

## 2019-12-22 NOTE — Progress Notes (Signed)
Subjective:    Patient ID: Madeline Schaefer, female    DOB: 22-Dec-1945, 74 y.o.   MRN: [id number]  HPI  Patient is a 74 yr old female who presents today for follow up.  HTN- maintained on losartan 25mg  and coreg.   BP Readings from Last 3 Encounters:  12/22/19 (!) 155/71  06/03/18 136/65  05/30/18 132/80   Anxiety- maintained on lexapro 10mg . Reports that her anxiety is improved.  Hyperlipidemia- maintained on statin. (lipitor 80mg ). Lab Results  Component Value Date   CHOL 159 07/16/2019   HDL 48.10 07/16/2019   LDLCALC 91 07/16/2019   TRIG 99.0 07/16/2019   CHOLHDL 3 07/16/2019   Hypothyroid- on synthroid 100 mcg.  Lab Results  Component Value Date   TSH 2.18 07/16/2019   IBS- reports that she she has been using bentyl 1/2 tab bid.  Reports that she tends to have diarrhea.   Notes some eczema in her ears   Review of Systems See HPI  Past Medical History:  Diagnosis Date  . Blood transfusion without reported diagnosis   . BRONCHITIS   . CAD (coronary artery disease)    LAD stent 2003 ([facility]).    . Cataract   . DYSPNEA   . HLD (hyperlipidemia)   . Hypothyroidism   . Mobitz type 2 second degree heart block    s/p PPM by Dr Olevia Perches 2004  . PACEMAKER, PERMANENT      Social History   Socioeconomic History  . Marital status: Married    Spouse name: Not on file  . Number of children: 0  . Years of education: Not on file  . Highest education level: Not on file  Occupational History  . Occupation: retired  Tobacco Use  . Smoking status: Never Smoker  . Smokeless tobacco: Never Used  Substance and Sexual Activity  . Alcohol use: Yes    Comment: 1-2 drink daily  . Drug use: No  . Sexual activity: Yes  Other Topics Concern  . Not on file  Social History Narrative   Retired Furniture conservator/restorer   Enjoys RV, bikes   No children   No pets   Social Determinants of Radio broadcast assistant Strain:   . Difficulty of Paying Living Expenses:   Food  Insecurity:   . Worried About Charity fundraiser in the Last Year:   . Arboriculturist in the Last Year:   Transportation Needs:   . Film/video editor (Medical):   Marland Kitchen Lack of Transportation (Non-Medical):   Physical Activity:   . Days of Exercise per Week:   . Minutes of Exercise per Session:   Stress:   . Feeling of Stress :   Social Connections:   . Frequency of Communication with Friends and Family:   . Frequency of Social Gatherings with Friends and Family:   . Attends Religious Services:   . Active Member of Clubs or Organizations:   . Attends Archivist Meetings:   Marland Kitchen Marital Status:   Intimate Partner Violence:   . Fear of Current or Ex-Partner:   . Emotionally Abused:   Marland Kitchen Physically Abused:   . Sexually Abused:     Past Surgical History:  Procedure Laterality Date  . CARDIAC CATHETERIZATION    . CYSTOSCOPY WITH RETROGRADE PYELOGRAM, URETEROSCOPY AND STENT PLACEMENT  08/19/2012   Procedure: CYSTOSCOPY WITH RETROGRADE PYELOGRAM, URETEROSCOPY AND STENT PLACEMENT;  Surgeon: Molli Hazard, MD;  Location: [facility];  Service: Urology;  Laterality: Right;  . MYOMECTOMY    . PACEMAKER GENERATOR CHANGE N/A 12/22/2011   Procedure: PACEMAKER GENERATOR CHANGE;  Surgeon: Thompson Grayer, MD;  Location: [facility] CATH LAB;  Service: Cardiovascular;  Laterality: N/A;  . PACEMAKER INSERTION  2004, 12/22/11   initial implant by Dr Olevia Perches, Generator change (MDT Adapta L) by Dr Rayann Heman 6/13  . TOTAL ABDOMINAL HYSTERECTOMY W/ BILATERAL SALPINGOOPHORECTOMY      Family History  Problem Relation Age of Onset  . Heart attack Mother        died at age 49  . Breast cancer Mother        age 74, had mastectomy.   . Prostate cancer Father   . Colon cancer Neg Hx   . Esophageal cancer Neg Hx   . Rectal cancer Neg Hx     Allergies  Allergen Reactions  . Bee Venom Anaphylaxis  . Codeine Other (See Comments)    REACTION: Passed  out, facial swelling and  hives REACTION: Passed  out, facial swelling and hives  . Pneumovax 23 [Pneumococcal Vac Polyvalent] Swelling    Local swelling and pain  . Lisinopril     COUGH  . Hornet Venom Hives    Hives over entire body  . Sulfonamide Derivatives Hives    Current Outpatient Medications on File Prior to Visit  Medication Sig Dispense Refill  . aspirin EC 81 MG tablet Take 81 mg by mouth daily.    Marland Kitchen atorvastatin (LIPITOR) 80 MG tablet Take 1 tablet (80 mg total) by mouth daily. 90 tablet 1  . carvedilol (COREG) 3.125 MG tablet Take 1 tablet (3.125 mg total) by mouth 2 (two) times daily. 180 tablet 3  . clobetasol ointment (TEMOVATE) 2.95 % Apply 1 application topically daily as needed (eczema).     . dicyclomine (BENTYL) 20 MG tablet TAKE 1 TABLET BY MOUTH TWICE DAILY AS NEEDED FOR SPASMS 180 tablet 0  . EPINEPHrine 0.3 mg/0.3 mL IJ SOAJ injection Inject 0.3 mLs (0.3 mg total) into the muscle once. 2 Device 1  . escitalopram (LEXAPRO) 10 MG tablet Take 1 tablet (10 mg total) by mouth daily. 90 tablet 0  . ibuprofen (ADVIL) 200 MG tablet Take 2 tablets (400 mg total) by mouth every 8 (eight) hours as needed for moderate pain (with food). 30 tablet 0  . levothyroxine (SYNTHROID) 100 MCG tablet Take 1 tablet (100 mcg total) by mouth daily before breakfast. 90 tablet 0  . losartan (COZAAR) 25 MG tablet Take 1/2 tablet by mouth daily. 45 tablet 1   Current Facility-Administered Medications on File Prior to Visit  Medication Dose Route Frequency Provider Last Rate Last Admin  . triamcinolone acetonide (KENALOG) 10 MG/ML injection 10 mg  10 mg Other Once Stover, Titorya, DPM        BP (!) 155/71 (BP Location: Right Arm, Patient Position: Sitting, Cuff Size: Small)   Pulse 60   Temp 97.6 F (36.4 C) (Temporal)   Resp 16   Ht 5\' 10"  (1.778 m)   Wt 163 lb (73.9 kg)   LMP 10/17/1986   SpO2 100%   BMI 23.39 kg/m       Objective:   Physical Exam Constitutional:      Appearance: She is  well-developed.  HENT:     Right Ear: Hearing and tympanic membrane normal.     Left Ear: Hearing and tympanic membrane normal.     Ears:     Comments: Some dry eczematous skin  noted in bilateral ear canals.  Neck:     Thyroid: No thyromegaly.  Cardiovascular:     Rate and Rhythm: Normal rate and regular rhythm.     Heart sounds: Normal heart sounds. No murmur.  Pulmonary:     Effort: Pulmonary effort is normal. No respiratory distress.     Breath sounds: Normal breath sounds. No wheezing.  Musculoskeletal:     Cervical back: Neck supple.  Skin:    General: Skin is warm and dry.  Neurological:     Mental Status: She is alert and oriented to person, place, and time.  Psychiatric:        Behavior: Behavior normal.        Thought Content: Thought content normal.        Judgment: Judgment normal.           Assessment & Plan:  Anxiety- stable on lexapro. Continue same.  HTN- bp elevated today. She is taking losartan 25 mg 1/2 once daily. Advised pt to increase to a full tab once daily. Continue coreg.   Hyperlipidemia- LDL at goal, continue statin.  Hypothyroid- clinically stable on synthroid.  Obtain follow up tsh.  IBS- she has been taking bentyl bid. Feels a bit dizzy. Advised her to cut back to PRN as needed for abdominal discomfort.  Eczema- bilateral ear canals- recommended topical application of otc hydrocortisone.   This visit occurred during the SARS-CoV-2 public health emergency.  Safety protocols were in place, including screening questions prior to the visit, additional usage of staff PPE, and extensive cleaning of exam room while observing appropriate contact time as indicated for disinfecting solutions.

## 2019-12-23 ENCOUNTER — Encounter: Payer: Self-pay | Admitting: Family

## 2019-12-23 NOTE — Telephone Encounter (Signed)
Lvm for patient to call about her lab results and medication changes

## 2019-12-23 NOTE — Telephone Encounter (Signed)
See telephone note.

## 2019-12-24 ENCOUNTER — Other Ambulatory Visit: Payer: Self-pay

## 2019-12-24 DIAGNOSIS — E039 Hypothyroidism, unspecified: Secondary | ICD-10-CM

## 2020-02-04 ENCOUNTER — Other Ambulatory Visit: Payer: Self-pay

## 2020-02-04 ENCOUNTER — Other Ambulatory Visit (INDEPENDENT_AMBULATORY_CARE_PROVIDER_SITE_OTHER): Payer: Medicare PPO

## 2020-02-04 DIAGNOSIS — E039 Hypothyroidism, unspecified: Secondary | ICD-10-CM

## 2020-02-04 LAB — TSH: TSH: 0.05 u[IU]/mL — ABNORMAL LOW (ref 0.35–4.50)

## 2020-02-05 ENCOUNTER — Telehealth: Payer: Self-pay | Admitting: Family

## 2020-02-05 ENCOUNTER — Other Ambulatory Visit: Payer: Self-pay

## 2020-02-05 DIAGNOSIS — E039 Hypothyroidism, unspecified: Secondary | ICD-10-CM

## 2020-02-05 NOTE — Telephone Encounter (Signed)
Lvm for patient to call about results 

## 2020-02-05 NOTE — Telephone Encounter (Signed)
Patient called back and was advised of results and medication instructions change. She verbalized understanding. She will call back to schedule 6 weeks lab appt. Orders entered as future for 6 weeks.

## 2020-02-05 NOTE — Telephone Encounter (Signed)
I reviewed her thyroid testing and I would like her to continue current dose of synthroid full tab 6 days a week and half tab one day a week. Repeat tsh in 6 weeks. Dx hypothyroid.

## 2020-02-06 ENCOUNTER — Ambulatory Visit: Payer: Medicare PPO | Admitting: Family

## 2020-02-06 ENCOUNTER — Other Ambulatory Visit: Payer: Medicare PPO

## 2020-02-19 ENCOUNTER — Other Ambulatory Visit: Payer: Self-pay | Admitting: Family

## 2020-03-10 ENCOUNTER — Ambulatory Visit (INDEPENDENT_AMBULATORY_CARE_PROVIDER_SITE_OTHER): Payer: Medicare PPO | Admitting: *Deleted

## 2020-03-10 DIAGNOSIS — I441 Atrioventricular block, second degree: Secondary | ICD-10-CM | POA: Diagnosis not present

## 2020-03-14 ENCOUNTER — Encounter: Payer: Self-pay | Admitting: Family

## 2020-03-15 ENCOUNTER — Telehealth: Payer: Self-pay | Admitting: Family

## 2020-03-15 ENCOUNTER — Encounter: Payer: Self-pay | Admitting: Family

## 2020-03-15 LAB — CUP PACEART REMOTE DEVICE CHECK
Battery Impedance: 1124 Ohm
Battery Remaining Longevity: 57 mo
Battery Voltage: 2.77 V
Brady Statistic AP VP Percent: 26 %
Brady Statistic AP VS Percent: 0 %
Brady Statistic AS VP Percent: 74 %
Brady Statistic AS VS Percent: 0 %
Date Time Interrogation Session: 20210830083345
Implantable Lead Implant Date: 20040907
Implantable Lead Implant Date: 20040907
Implantable Lead Location: 753859
Implantable Lead Location: 753860
Implantable Lead Model: 5076
Implantable Lead Model: 5076
Implantable Pulse Generator Implant Date: 20130607
Lead Channel Impedance Value: 469 Ohm
Lead Channel Impedance Value: 721 Ohm
Lead Channel Pacing Threshold Amplitude: 0.625 V
Lead Channel Pacing Threshold Amplitude: 0.75 V
Lead Channel Pacing Threshold Pulse Width: 0.4 ms
Lead Channel Pacing Threshold Pulse Width: 0.4 ms
Lead Channel Setting Pacing Amplitude: 2 V
Lead Channel Setting Pacing Amplitude: 2.5 V
Lead Channel Setting Pacing Pulse Width: 0.4 ms
Lead Channel Setting Sensing Sensitivity: 2 mV

## 2020-03-15 MED ORDER — ESCITALOPRAM OXALATE 10 MG PO TABS: 10.0000 mg | ORAL_TABLET | Freq: Every day | ORAL | 0 refills | Status: DC

## 2020-03-15 NOTE — Telephone Encounter (Signed)
Please mail copy of 12/22/19 office visit note to patient at pt request.

## 2020-03-15 NOTE — Telephone Encounter (Signed)
Last ov note mailed out to po box provided on another message as requested

## 2020-03-16 NOTE — Progress Notes (Signed)
Remote pacemaker transmission.   

## 2020-03-17 NOTE — Addendum Note (Signed)
Addended by: Kelle Darting A on: 03/17/2020 02:04 PM   Modules accepted: Orders

## 2020-03-18 ENCOUNTER — Other Ambulatory Visit (INDEPENDENT_AMBULATORY_CARE_PROVIDER_SITE_OTHER): Payer: Medicare PPO

## 2020-03-18 ENCOUNTER — Other Ambulatory Visit: Payer: Self-pay

## 2020-03-18 DIAGNOSIS — E039 Hypothyroidism, unspecified: Secondary | ICD-10-CM | POA: Diagnosis not present

## 2020-03-18 LAB — TSH: TSH: 0.06 mIU/L — ABNORMAL LOW (ref 0.40–4.50)

## 2020-03-19 ENCOUNTER — Other Ambulatory Visit: Payer: Medicare PPO

## 2020-03-20 ENCOUNTER — Other Ambulatory Visit: Payer: Self-pay | Admitting: Family

## 2020-03-20 NOTE — Telephone Encounter (Signed)
Lab work show synthroid dose still too strong. I would like her to decrease synthroid form 112 mcg to 151mcg daily and repeat tsh in 6 weeks. Dx hypothyroid.

## 2020-03-23 ENCOUNTER — Encounter: Payer: Self-pay | Admitting: Family

## 2020-03-23 DIAGNOSIS — E039 Hypothyroidism, unspecified: Secondary | ICD-10-CM

## 2020-03-24 MED ORDER — LEVOTHYROXINE SODIUM 100 MCG PO TABS
100.0000 ug | ORAL_TABLET | Freq: Every day | ORAL | 3 refills | Status: DC
Start: 2020-03-24 — End: 2020-04-28

## 2020-03-24 NOTE — Telephone Encounter (Signed)
Rx sent, patient advised of change. She will call for lab appointment

## 2020-03-24 NOTE — Telephone Encounter (Signed)
PT called to confirm her message did go thru. And she wants to know if it can be done in the next 2 days because she only has 2 pills left

## 2020-04-21 ENCOUNTER — Other Ambulatory Visit: Payer: Medicare PPO

## 2020-04-27 ENCOUNTER — Other Ambulatory Visit: Payer: Self-pay

## 2020-04-27 ENCOUNTER — Other Ambulatory Visit (INDEPENDENT_AMBULATORY_CARE_PROVIDER_SITE_OTHER): Payer: Medicare PPO

## 2020-04-27 DIAGNOSIS — E039 Hypothyroidism, unspecified: Secondary | ICD-10-CM | POA: Diagnosis not present

## 2020-04-27 LAB — TSH: TSH: 0.07 mIU/L — ABNORMAL LOW (ref 0.40–4.50)

## 2020-04-28 ENCOUNTER — Telehealth: Payer: Self-pay | Admitting: Family

## 2020-04-28 MED ORDER — LEVOTHYROXINE SODIUM 88 MCG PO TABS
88.0000 ug | ORAL_TABLET | Freq: Every day | ORAL | 0 refills | Status: DC
Start: 2020-04-28 — End: 2020-09-20

## 2020-04-28 NOTE — Telephone Encounter (Signed)
Please advise pt that based on her lab results I would like for her to decrease synthroid to 2mcg once daily. Repeat TSH in 6 weeks.

## 2020-04-29 ENCOUNTER — Other Ambulatory Visit: Payer: Self-pay

## 2020-04-29 DIAGNOSIS — E039 Hypothyroidism, unspecified: Secondary | ICD-10-CM

## 2020-04-29 NOTE — Telephone Encounter (Signed)
Patient advised of results and dose change. She is not sure if she will be in [place] or [place] in 6 weeks. Test order entered if she is not in [place] she will follow up with her pcp in [facility].

## 2020-05-05 ENCOUNTER — Other Ambulatory Visit: Payer: Self-pay

## 2020-05-05 DIAGNOSIS — I255 Ischemic cardiomyopathy: Secondary | ICD-10-CM

## 2020-05-05 MED ORDER — CARVEDILOL 3.125 MG PO TABS: 3.1250 mg | ORAL_TABLET | Freq: Two times a day (BID) | ORAL | 0 refills | Status: DC

## 2020-05-05 NOTE — Telephone Encounter (Signed)
Pt's medication was sent to pt's pharmacy as requested. Confirmation received.  °

## 2020-05-21 DIAGNOSIS — E038 Other specified hypothyroidism: Secondary | ICD-10-CM | POA: Diagnosis not present

## 2020-05-21 DIAGNOSIS — E7849 Other hyperlipidemia: Secondary | ICD-10-CM | POA: Diagnosis not present

## 2020-05-21 DIAGNOSIS — I10 Essential (primary) hypertension: Secondary | ICD-10-CM | POA: Diagnosis not present

## 2020-05-25 ENCOUNTER — Encounter: Payer: Self-pay | Admitting: Family

## 2020-05-25 MED ORDER — ATORVASTATIN CALCIUM 80 MG PO TABS
80.0000 mg | ORAL_TABLET | Freq: Every day | ORAL | 0 refills | Status: DC
Start: 2020-05-25 — End: 2020-09-03

## 2020-05-27 DIAGNOSIS — J3089 Other allergic rhinitis: Secondary | ICD-10-CM | POA: Diagnosis not present

## 2020-05-27 DIAGNOSIS — I359 Nonrheumatic aortic valve disorder, unspecified: Secondary | ICD-10-CM | POA: Diagnosis not present

## 2020-05-27 DIAGNOSIS — E038 Other specified hypothyroidism: Secondary | ICD-10-CM | POA: Diagnosis not present

## 2020-05-27 DIAGNOSIS — I10 Essential (primary) hypertension: Secondary | ICD-10-CM | POA: Diagnosis not present

## 2020-05-27 DIAGNOSIS — Z6823 Body mass index (BMI) 23.0-23.9, adult: Secondary | ICD-10-CM | POA: Diagnosis not present

## 2020-05-27 DIAGNOSIS — E7849 Other hyperlipidemia: Secondary | ICD-10-CM | POA: Diagnosis not present

## 2020-06-09 ENCOUNTER — Ambulatory Visit (INDEPENDENT_AMBULATORY_CARE_PROVIDER_SITE_OTHER): Payer: Medicare PPO

## 2020-06-09 DIAGNOSIS — T63454D Toxic effect of venom of hornets, undetermined, subsequent encounter: Secondary | ICD-10-CM | POA: Diagnosis not present

## 2020-06-09 DIAGNOSIS — T63464D Toxic effect of venom of wasps, undetermined, subsequent encounter: Secondary | ICD-10-CM | POA: Diagnosis not present

## 2020-06-09 DIAGNOSIS — I441 Atrioventricular block, second degree: Secondary | ICD-10-CM | POA: Diagnosis not present

## 2020-06-16 LAB — CUP PACEART REMOTE DEVICE CHECK
Battery Impedance: 1233 Ohm
Battery Remaining Longevity: 52 mo
Battery Voltage: 2.76 V
Brady Statistic AP VP Percent: 26 %
Brady Statistic AP VS Percent: 0 %
Brady Statistic AS VP Percent: 74 %
Brady Statistic AS VS Percent: 0 %
Date Time Interrogation Session: 20211129142932
Implantable Lead Implant Date: 20040907
Implantable Lead Implant Date: 20040907
Implantable Lead Location: 753859
Implantable Lead Location: 753860
Implantable Lead Model: 5076
Implantable Lead Model: 5076
Implantable Pulse Generator Implant Date: 20130607
Lead Channel Impedance Value: 437 Ohm
Lead Channel Impedance Value: 626 Ohm
Lead Channel Pacing Threshold Amplitude: 0.625 V
Lead Channel Pacing Threshold Amplitude: 0.875 V
Lead Channel Pacing Threshold Pulse Width: 0.4 ms
Lead Channel Pacing Threshold Pulse Width: 0.4 ms
Lead Channel Setting Pacing Amplitude: 2 V
Lead Channel Setting Pacing Amplitude: 2.5 V
Lead Channel Setting Pacing Pulse Width: 0.4 ms
Lead Channel Setting Sensing Sensitivity: 2 mV

## 2020-06-16 NOTE — Progress Notes (Signed)
Remote pacemaker transmission.   

## 2020-06-17 ENCOUNTER — Encounter: Payer: Self-pay | Admitting: Family

## 2020-06-18 ENCOUNTER — Other Ambulatory Visit: Payer: Self-pay

## 2020-06-18 MED ORDER — ESCITALOPRAM OXALATE 10 MG PO TABS
10.0000 mg | ORAL_TABLET | Freq: Every day | ORAL | 0 refills | Status: DC
Start: 2020-06-18 — End: 2020-09-14

## 2020-06-18 NOTE — Telephone Encounter (Signed)
Medication refilled and sent

## 2020-06-24 ENCOUNTER — Encounter: Payer: Medicare PPO | Admitting: Nurse Practitioner

## 2020-07-07 DIAGNOSIS — Z6824 Body mass index (BMI) 24.0-24.9, adult: Secondary | ICD-10-CM | POA: Diagnosis not present

## 2020-07-07 DIAGNOSIS — T63451D Toxic effect of venom of hornets, accidental (unintentional), subsequent encounter: Secondary | ICD-10-CM | POA: Diagnosis not present

## 2020-07-26 ENCOUNTER — Telehealth: Payer: Self-pay | Admitting: Family

## 2020-07-26 NOTE — Telephone Encounter (Signed)
See mychart.  

## 2020-07-29 ENCOUNTER — Other Ambulatory Visit: Payer: Self-pay | Admitting: Cardiology

## 2020-07-29 DIAGNOSIS — I255 Ischemic cardiomyopathy: Secondary | ICD-10-CM

## 2020-08-01 ENCOUNTER — Encounter: Payer: Self-pay | Admitting: Family

## 2020-08-02 ENCOUNTER — Other Ambulatory Visit: Payer: Self-pay

## 2020-08-02 DIAGNOSIS — T63464D Toxic effect of venom of wasps, undetermined, subsequent encounter: Secondary | ICD-10-CM | POA: Diagnosis not present

## 2020-08-02 DIAGNOSIS — T63454D Toxic effect of venom of hornets, undetermined, subsequent encounter: Secondary | ICD-10-CM | POA: Diagnosis not present

## 2020-08-02 DIAGNOSIS — I255 Ischemic cardiomyopathy: Secondary | ICD-10-CM

## 2020-08-02 MED ORDER — CARVEDILOL 3.125 MG PO TABS: 3.1250 mg | ORAL_TABLET | Freq: Two times a day (BID) | ORAL | 0 refills | Status: DC

## 2020-08-02 NOTE — Telephone Encounter (Signed)
This is Dr. Hochrein's pt. °

## 2020-08-04 MED ORDER — CARVEDILOL 3.125 MG PO TABS: 3.1250 mg | ORAL_TABLET | Freq: Two times a day (BID) | ORAL | 0 refills | Status: DC

## 2020-09-02 ENCOUNTER — Encounter: Payer: Self-pay | Admitting: Family

## 2020-09-03 MED ORDER — ATORVASTATIN CALCIUM 80 MG PO TABS
80.0000 mg | ORAL_TABLET | Freq: Every day | ORAL | 0 refills | Status: DC
Start: 2020-09-03 — End: 2020-09-20

## 2020-09-07 DIAGNOSIS — T63451D Toxic effect of venom of hornets, accidental (unintentional), subsequent encounter: Secondary | ICD-10-CM | POA: Diagnosis not present

## 2020-09-07 DIAGNOSIS — T63461D Toxic effect of venom of wasps, accidental (unintentional), subsequent encounter: Secondary | ICD-10-CM | POA: Diagnosis not present

## 2020-09-07 DIAGNOSIS — T63441D Toxic effect of venom of bees, accidental (unintentional), subsequent encounter: Secondary | ICD-10-CM | POA: Diagnosis not present

## 2020-09-08 ENCOUNTER — Ambulatory Visit (INDEPENDENT_AMBULATORY_CARE_PROVIDER_SITE_OTHER): Payer: Medicare PPO

## 2020-09-08 DIAGNOSIS — I441 Atrioventricular block, second degree: Secondary | ICD-10-CM | POA: Diagnosis not present

## 2020-09-09 LAB — CUP PACEART REMOTE DEVICE CHECK
Battery Impedance: 1341 Ohm
Battery Remaining Longevity: 49 mo
Battery Voltage: 2.76 V
Brady Statistic AP VP Percent: 27 %
Brady Statistic AP VS Percent: 0 %
Brady Statistic AS VP Percent: 73 %
Brady Statistic AS VS Percent: 0 %
Date Time Interrogation Session: 20220224092926
Implantable Lead Implant Date: 20040907
Implantable Lead Implant Date: 20040907
Implantable Lead Location: 753859
Implantable Lead Location: 753860
Implantable Lead Model: 5076
Implantable Lead Model: 5076
Implantable Pulse Generator Implant Date: 20130607
Lead Channel Impedance Value: 456 Ohm
Lead Channel Impedance Value: 648 Ohm
Lead Channel Pacing Threshold Amplitude: 0.5 V
Lead Channel Pacing Threshold Amplitude: 0.75 V
Lead Channel Pacing Threshold Pulse Width: 0.4 ms
Lead Channel Pacing Threshold Pulse Width: 0.4 ms
Lead Channel Setting Pacing Amplitude: 2 V
Lead Channel Setting Pacing Amplitude: 2.5 V
Lead Channel Setting Pacing Pulse Width: 0.4 ms
Lead Channel Setting Sensing Sensitivity: 2 mV

## 2020-09-14 ENCOUNTER — Telehealth: Payer: Self-pay | Admitting: Family

## 2020-09-14 MED ORDER — ESCITALOPRAM OXALATE 10 MG PO TABS
10.0000 mg | ORAL_TABLET | Freq: Every day | ORAL | 0 refills | Status: DC
Start: 2020-09-14 — End: 2020-09-20

## 2020-09-14 NOTE — Telephone Encounter (Signed)
Medication: escitalopram (LEXAPRO) 10 MG tablet [[id number]]   Has the patient contacted their pharmacy? No. (If no, request that the patient contact the pharmacy for the refill.) (If yes, when and what did the pharmacy advise?)  Preferred Pharmacy (with phone number or street name):  [place], [place] Phone:  [phone]  Fax:  [phone]       Agent: Please be advised that RX refills may take up to 3 business days. We ask that you follow-up with your pharmacy.

## 2020-09-14 NOTE — Telephone Encounter (Signed)
Rx sent- [facility]

## 2020-09-17 NOTE — Progress Notes (Signed)
Remote pacemaker transmission.   

## 2020-09-20 ENCOUNTER — Other Ambulatory Visit: Payer: Self-pay

## 2020-09-20 ENCOUNTER — Ambulatory Visit: Payer: Medicare PPO | Admitting: Family

## 2020-09-20 VITALS — BP 122/62 | HR 59 | Temp 98.6°F | Resp 16 | Ht 70.0 in | Wt 165.0 lb

## 2020-09-20 DIAGNOSIS — I10 Essential (primary) hypertension: Secondary | ICD-10-CM

## 2020-09-20 DIAGNOSIS — E785 Hyperlipidemia, unspecified: Secondary | ICD-10-CM | POA: Diagnosis not present

## 2020-09-20 DIAGNOSIS — E039 Hypothyroidism, unspecified: Secondary | ICD-10-CM

## 2020-09-20 DIAGNOSIS — F419 Anxiety disorder, unspecified: Secondary | ICD-10-CM

## 2020-09-20 LAB — TSH: TSH: 0.61 u[IU]/mL (ref 0.35–4.50)

## 2020-09-20 MED ORDER — ATORVASTATIN CALCIUM 80 MG PO TABS: 80.0000 mg | ORAL_TABLET | Freq: Every day | ORAL | 1 refills | Status: DC

## 2020-09-20 MED ORDER — ESCITALOPRAM OXALATE 10 MG PO TABS: 10.0000 mg | ORAL_TABLET | Freq: Every day | ORAL | 1 refills | Status: DC

## 2020-09-20 MED ORDER — LEVOTHYROXINE SODIUM 88 MCG PO TABS: 88.0000 ug | ORAL_TABLET | Freq: Every day | ORAL | 1 refills | Status: DC

## 2020-09-20 MED ORDER — LOSARTAN POTASSIUM 25 MG PO TABS: 25.0000 mg | ORAL_TABLET | Freq: Every day | ORAL | 1 refills | Status: DC

## 2020-09-20 NOTE — Progress Notes (Signed)
Subjective:    Patient ID: Madeline Schaefer, female    DOB: Mar 31, 1946, 75 y.o.   MRN: [id number]  HPI   Patient presents today for follow up. She recently sold her house and moved to [facility] in [place].[name] her new home.  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  She plans to winter there is 3 months out of the year.  Hypothyroid- last visit we decreased her synthroid to 88 mcg once daily.  Lab Results  Component Value Date   TSH 0.07 (L) 04/27/2020   HTN- maintained on carvedilol 3.125mg  twice daily, losartan 25mg  BP Readings from Last 3 Encounters:  09/20/20 122/62  12/22/19 (!) 155/71  06/03/18 136/65   Hyperlipidemia-she is maintained on atorvastatin 80 mg p.o. daily. Lab Results  Component Value Date   CHOL 159 07/16/2019   HDL 48.10 07/16/2019   LDLCALC 91 07/16/2019   TRIG 99.0 07/16/2019   CHOLHDL 3 07/16/2019   Anxiety/depression- maintained on lexapro 10 mg.  She reports mood is good.     Review of Systems    see HPI  Past Medical History:  Diagnosis Date  . Blood transfusion without reported diagnosis   . BRONCHITIS   . CAD (coronary artery disease)    LAD stent 2003 ([facility]).    . Cataract   . DYSPNEA   . HLD (hyperlipidemia)   . Hypothyroidism   . Mobitz type 2 second degree heart block    s/p PPM by Dr Olevia Perches 2004  . PACEMAKER, PERMANENT      Social History   Socioeconomic History  . Marital status: Married    Spouse name: Not on file  . Number of children: 0  . Years of education: Not on file  . Highest education level: Not on file  Occupational History  . Occupation: retired  Tobacco Use  . Smoking status: Never Smoker  . Smokeless tobacco: Never Used  Vaping Use  . Vaping Use: Never used  Substance and Sexual Activity  . Alcohol use: Yes    Comment: 1-2 drink daily  . Drug use: No  . Sexual activity: Yes  Other Topics Concern  . Not on file  Social History Narrative   Retired Furniture conservator/restorer   Enjoys RV, bikes    No children   No pets   Social Determinants of Radio broadcast assistant Strain: Not on file  Food Insecurity: Not on file  Transportation Needs: Not on file  Physical Activity: Not on file  Stress: Not on file  Social Connections: Not on file  Intimate Partner Violence: Not on file    Past Surgical History:  Procedure Laterality Date  . CARDIAC CATHETERIZATION    . CYSTOSCOPY WITH RETROGRADE PYELOGRAM, URETEROSCOPY AND STENT PLACEMENT  08/19/2012   Procedure: CYSTOSCOPY WITH RETROGRADE PYELOGRAM, URETEROSCOPY AND STENT PLACEMENT;  Surgeon: Molli Hazard, MD;  Location: [facility];  Service: Urology;  Laterality: Right;  . MYOMECTOMY    . PACEMAKER GENERATOR CHANGE N/A 12/22/2011   Procedure: PACEMAKER GENERATOR CHANGE;  Surgeon: Thompson Grayer, MD;  Location: [facility] CATH LAB;  Service: Cardiovascular;  Laterality: N/A;  . PACEMAKER INSERTION  2004, 12/22/11   initial implant by Dr Olevia Perches, Generator change (MDT Adapta L) by Dr Rayann Heman 6/13  . TOTAL ABDOMINAL HYSTERECTOMY W/ BILATERAL SALPINGOOPHORECTOMY      Family History  Problem Relation Age of Onset  . Heart attack Mother        died  at age 9  . Breast cancer Mother        age 45, had mastectomy.   . Prostate cancer Father   . Colon cancer Neg Hx   . Esophageal cancer Neg Hx   . Rectal cancer Neg Hx     Allergies  Allergen Reactions  . Bee Venom Anaphylaxis  . Codeine Other (See Comments)    REACTION: Passed  out, facial swelling and hives REACTION: Passed  out, facial swelling and hives  . Pneumovax 23 [Pneumococcal Vac Polyvalent] Swelling    Local swelling and pain  . Lisinopril     COUGH  . Hornet Venom Hives    Hives over entire body  . Sulfonamide Derivatives Hives    Current Outpatient Medications on File Prior to Visit  Medication Sig Dispense Refill  . aspirin EC 81 MG tablet Take 81 mg by mouth daily.    . carvedilol (COREG) 3.125 MG tablet Take 1 tablet (3.125 mg total) by mouth  2 (two) times daily. Please make overdue appt with Dr. Percival Spanish before anymore refills. 1st attempt 60 tablet 0  . clobetasol ointment (TEMOVATE) 4.03 % Apply 1 application topically daily as needed (eczema).     . dicyclomine (BENTYL) 20 MG tablet TAKE 1 TABLET BY MOUTH TWICE DAILY AS NEEDED FOR SPASMS 180 tablet 0  . EPINEPHrine 0.3 mg/0.3 mL IJ SOAJ injection Inject 0.3 mLs (0.3 mg total) into the muscle once. 2 Device 1  . ibuprofen (ADVIL) 200 MG tablet Take 2 tablets (400 mg total) by mouth every 8 (eight) hours as needed for moderate pain (with food). 30 tablet 0   Current Facility-Administered Medications on File Prior to Visit  Medication Dose Route Frequency Provider Last Rate Last Admin  . triamcinolone acetonide (KENALOG) 10 MG/ML injection 10 mg  10 mg Other Once Stover, Titorya, DPM        BP 122/62 (BP Location: Right Arm, Patient Position: Sitting, Cuff Size: Small)   Pulse (!) 59   Temp 98.6 F (37 C) (Oral)   Resp 16   Ht 5\' 10"  (1.778 m)   Wt 165 lb (74.8 kg)   LMP 10/17/1986   SpO2 100%   BMI 23.68 kg/m    Objective:   Physical Exam Constitutional:      Appearance: She is well-developed and well-nourished.  Neck:     Thyroid: No thyromegaly.  Cardiovascular:     Rate and Rhythm: Normal rate and regular rhythm.     Heart sounds: Normal heart sounds. No murmur heard.   Pulmonary:     Effort: Pulmonary effort is normal. No respiratory distress.     Breath sounds: Normal breath sounds. No wheezing.  Musculoskeletal:     Cervical back: Neck supple.  Skin:    General: Skin is warm and dry.  Neurological:     Mental Status: She is alert and oriented to person, place, and time.  Psychiatric:        Mood and Affect: Mood and affect normal.        Behavior: Behavior normal.        Thought Content: Thought content normal.        Judgment: Judgment normal.           Assessment & Plan:  Hypothyroid-will obtain follow-up TSH.  Continue 88 mcg Synthroid  for now.  Hypertension-blood pressure is stable.  Continue carvedilol 3.125mg  twice daily, losartan 25mg  daily. She had recent basic metabolic panel and CBC which were performed  in [place] and records will be scanned into chart.  Hyperlipidemia-lipids at goal per review of outside records.  Continue atorvastatin 80 mg p.o. daily.  Anxiety/depression-stable on Lexapro 10 mg p.o. daily.  Continue same.  This visit occurred during the SARS-CoV-2 public health emergency.  Safety protocols were in place, including screening questions prior to the visit, additional usage of staff PPE, and extensive cleaning of exam room while observing appropriate contact time as indicated for disinfecting solutions.

## 2020-09-20 NOTE — Patient Instructions (Signed)
Please complete lab work prior to leaving.   

## 2020-10-05 DIAGNOSIS — T63461D Toxic effect of venom of wasps, accidental (unintentional), subsequent encounter: Secondary | ICD-10-CM | POA: Diagnosis not present

## 2020-10-05 DIAGNOSIS — T63451D Toxic effect of venom of hornets, accidental (unintentional), subsequent encounter: Secondary | ICD-10-CM | POA: Diagnosis not present

## 2020-10-06 ENCOUNTER — Encounter: Payer: Self-pay | Admitting: Family

## 2020-10-06 DIAGNOSIS — M24849 Other specific joint derangements of unspecified hand, not elsewhere classified: Secondary | ICD-10-CM

## 2020-10-11 DIAGNOSIS — M1812 Unilateral primary osteoarthritis of first carpometacarpal joint, left hand: Secondary | ICD-10-CM | POA: Diagnosis not present

## 2020-10-11 DIAGNOSIS — M65311 Trigger thumb, right thumb: Secondary | ICD-10-CM | POA: Diagnosis not present

## 2020-10-27 DIAGNOSIS — L821 Other seborrheic keratosis: Secondary | ICD-10-CM | POA: Diagnosis not present

## 2020-10-27 DIAGNOSIS — L820 Inflamed seborrheic keratosis: Secondary | ICD-10-CM | POA: Diagnosis not present

## 2020-10-27 DIAGNOSIS — D225 Melanocytic nevi of trunk: Secondary | ICD-10-CM | POA: Diagnosis not present

## 2020-10-27 DIAGNOSIS — D1801 Hemangioma of skin and subcutaneous tissue: Secondary | ICD-10-CM | POA: Diagnosis not present

## 2020-10-27 DIAGNOSIS — L814 Other melanin hyperpigmentation: Secondary | ICD-10-CM | POA: Diagnosis not present

## 2020-11-04 DIAGNOSIS — T63461D Toxic effect of venom of wasps, accidental (unintentional), subsequent encounter: Secondary | ICD-10-CM | POA: Diagnosis not present

## 2020-11-04 DIAGNOSIS — T63451D Toxic effect of venom of hornets, accidental (unintentional), subsequent encounter: Secondary | ICD-10-CM | POA: Diagnosis not present

## 2020-11-28 NOTE — Progress Notes (Signed)
Cardiology Office Note   Date:  11/30/2020   ID:  Madeline Schaefer, DOB November 03, 1945, MRN [id number]  PCP:  [name], NP  Cardiologist:   None   Chief Complaint  Patient presents with  . Coronary Artery Disease      History of Present Illness: Madeline Schaefer is a 75 y.o. female who presents for follow up of CAD. I have not seen her since 2019.   He has a mildly reduced EF and moderate AI.   She has a distant history of stenting to her LAD. In  2011 she had an EF was 40 - 45%.  She had mild AI.  Her EF in June of this 2020 was 50 - 55%  with moderate AI.    She has a pacemaker and is followed by Dr. Rayann Heman.    It has been a few years since I saw her in the office.  She has done well.  She moved to a retirement home at [facility].  She still riding bikes with her 52 year old husband.  They drive a camper and spent part of the year in [place].  The patient denies any new symptoms such as chest discomfort, neck or arm discomfort. There has been no new shortness of breath, PND or orthopnea. There have been no reported palpitations, presyncope or syncope.    Past Medical History:  Diagnosis Date  . Blood transfusion without reported diagnosis   . BRONCHITIS   . CAD (coronary artery disease)    LAD stent 2003 ([facility]).    . Cataract   . DYSPNEA   . HLD (hyperlipidemia)   . Hypothyroidism   . Mobitz type 2 second degree heart block    s/p PPM by Dr Olevia Perches 2004  . PACEMAKER, PERMANENT     Past Surgical History:  Procedure Laterality Date  . CARDIAC CATHETERIZATION    . CYSTOSCOPY WITH RETROGRADE PYELOGRAM, URETEROSCOPY AND STENT PLACEMENT  08/19/2012   Procedure: CYSTOSCOPY WITH RETROGRADE PYELOGRAM, URETEROSCOPY AND STENT PLACEMENT;  Surgeon: Molli Hazard, MD;  Location: [facility];  Service: Urology;  Laterality: Right;  . MYOMECTOMY    . PACEMAKER GENERATOR CHANGE N/A 12/22/2011   Procedure: PACEMAKER GENERATOR CHANGE;  Surgeon: Thompson Grayer,  MD;  Location: [facility] CATH LAB;  Service: Cardiovascular;  Laterality: N/A;  . PACEMAKER INSERTION  2004, 12/22/11   initial implant by Dr Olevia Perches, Generator change (MDT Adapta L) by Dr Rayann Heman 6/13  . TOTAL ABDOMINAL HYSTERECTOMY W/ BILATERAL SALPINGOOPHORECTOMY       Current Outpatient Medications  Medication Sig Dispense Refill  . aspirin EC 81 MG tablet Take 81 mg by mouth daily.    Marland Kitchen atorvastatin (LIPITOR) 80 MG tablet Take 1 tablet (80 mg total) by mouth daily. 90 tablet 1  . carvedilol (COREG) 3.125 MG tablet Take 1 tablet (3.125 mg total) by mouth 2 (two) times daily. Please make overdue appt with Dr. Percival Spanish before anymore refills. 1st attempt 60 tablet 0  . clobetasol ointment (TEMOVATE) 7.02 % Apply 1 application topically daily as needed (eczema).     . dicyclomine (BENTYL) 20 MG tablet TAKE 1 TABLET BY MOUTH TWICE DAILY AS NEEDED FOR SPASMS 180 tablet 0  . EPINEPHrine 0.3 mg/0.3 mL IJ SOAJ injection Inject 0.3 mLs (0.3 mg total) into the muscle once. 2 Device 1  . escitalopram (LEXAPRO) 10 MG tablet Take 1 tablet (10 mg total) by mouth daily. 90 tablet 1  . ibuprofen (ADVIL) 200 MG tablet  Take 2 tablets (400 mg total) by mouth every 8 (eight) hours as needed for moderate pain (with food). 30 tablet 0  . levothyroxine (SYNTHROID) 88 MCG tablet Take 1 tablet (88 mcg total) by mouth daily. 90 tablet 1  . losartan (COZAAR) 25 MG tablet Take 1 tablet (25 mg total) by mouth daily. 90 tablet 1   Current Facility-Administered Medications  Medication Dose Route Frequency Provider Last Rate Last Admin  . triamcinolone acetonide (KENALOG) 10 MG/ML injection 10 mg  10 mg Other Once Landis Martins, DPM        Allergies:   Bee venom, Codeine, Pneumovax 23 [pneumococcal vac polyvalent], Lisinopril, Hornet venom, and Sulfonamide derivatives    ROS:  Please see the history of present illness.   Otherwise, review of systems are positive for none.   All other systems are reviewed and negative.     PHYSICAL EXAM: VS:  BP (!) 150/73   Pulse 62   Ht 5\' 10"  (1.778 m)   Wt 168 lb 9.6 oz (76.5 kg)   LMP 10/17/1986   SpO2 98%   BMI 24.19 kg/m  , BMI Body mass index is 24.19 kg/m. GENERAL:  Well appearing NECK:  No jugular venous distention, waveform within normal limits, carotid upstroke brisk and symmetric, no bruits, no thyromegaly LUNGS:  Clear to auscultation bilaterally CHEST:  Well healed pacemaker pocket. HEART:  PMI not displaced or sustained,S1 and S2 within normal limits, no S3, no S4, no clicks, no rubs, 2 out of 6 apical systolic murmur very brief and radiating slightly at the aortic outflow tract and into the carotids, 2 out of 6 apical diastolic murmurs ABD:  Flat, positive bowel sounds normal in frequency in pitch, no bruits, no rebound, no guarding, no midline pulsatile mass, no hepatomegaly, no splenomegaly EXT:  2 plus pulses throughout, no edema, no cyanosis no clubbing   EKG:  EKG is ordered today. The ekg ordered today demonstrates Atrioventricular demand pacing   Recent Labs: 12/22/2019: BUN 20; Creatinine, Ser 0.77; Potassium 4.4; Sodium 136 09/20/2020: TSH 0.61    Lipid Panel    Component Value Date/Time   CHOL 159 07/16/2019 1446   CHOL 128 01/12/2017 1544   TRIG 99.0 07/16/2019 1446   HDL 48.10 07/16/2019 1446   HDL 51 01/12/2017 1544   CHOLHDL 3 07/16/2019 1446   VLDL 19.8 07/16/2019 1446   LDLCALC 91 07/16/2019 1446   LDLCALC 64 01/12/2017 1544      Wt Readings from Last 3 Encounters:  11/30/20 168 lb 9.6 oz (76.5 kg)  09/20/20 165 lb (74.8 kg)  12/22/19 163 lb (73.9 kg)      Other studies Reviewed: Additional studies/ records that were reviewed today include: Labs. Review of the above records demonstrates:  Please see elsewhere in the note.     ASSESSMENT AND PLAN:   CAD:    The patient has no new sypmtoms.  No further cardiovascular testing is indicated.  We will continue with aggressive risk reduction and meds as  listed.  STATUS POST PACEMAKER:    She is up-to-date with follow-up and I did review the last interrogation.  This was from February that I can find.  HTN:      Blood pressure is slightly elevated but this is unusual.  She will keep a blood pressure diary and let me know.  I might need to uptitrate her medications.   DYSLIPIDEMIA:    Her LDL is 73 with an HDL of 51.  Triglycerides  are 88.  This was done in [place] and I was able to find these records to review for this appointment.   REDUCED EF:   EF was 50 - 55% on echo in 2020.  I will repeat an echocardiogram.   AI/MR:   I will follow this up as above.   Current medicines are reviewed at length with the patient today.  The patient has no concerns regarding medicines.  The following changes have been made:  no change  Labs/ tests ordered today include:   Orders Placed This Encounter  Procedures  . EKG 12-Lead  . ECHOCARDIOGRAM COMPLETE     Disposition:   FU with me in one year.     Signed, Minus Breeding, MD  11/30/2020 2:54 PM    [facility]

## 2020-11-30 ENCOUNTER — Other Ambulatory Visit: Payer: Self-pay

## 2020-11-30 ENCOUNTER — Encounter: Payer: Self-pay | Admitting: Cardiology

## 2020-11-30 ENCOUNTER — Ambulatory Visit: Payer: Medicare PPO | Admitting: Cardiology

## 2020-11-30 VITALS — BP 150/73 | HR 62 | Ht 70.0 in | Wt 168.6 lb

## 2020-11-30 DIAGNOSIS — E785 Hyperlipidemia, unspecified: Secondary | ICD-10-CM

## 2020-11-30 DIAGNOSIS — I10 Essential (primary) hypertension: Secondary | ICD-10-CM

## 2020-11-30 DIAGNOSIS — I2510 Atherosclerotic heart disease of native coronary artery without angina pectoris: Secondary | ICD-10-CM | POA: Diagnosis not present

## 2020-11-30 DIAGNOSIS — I351 Nonrheumatic aortic (valve) insufficiency: Secondary | ICD-10-CM

## 2020-11-30 NOTE — Patient Instructions (Signed)
Medication Instructions:  The current medical regimen is effective;  continue present plan and medications.  *If you need a refill on your cardiac medications before your next appointment, please call your pharmacy*   Testing/Procedures: Echocardiogram - Your physician has requested that you have an echocardiogram. Echocardiography is a painless test that uses sound waves to create images of your heart. It provides your doctor with information about the size and shape of your heart and how well your heart's chambers and valves are working. This procedure takes approximately one hour. There are no restrictions for this procedure. This will be performed at our [address] location - [address], [address].    Follow-Up: At [facility], you and your health needs are our priority.  As part of our continuing mission to provide you with exceptional heart care, we have created designated Provider Care Teams.  These Care Teams include your primary Cardiologist (physician) and Advanced Practice Providers (APPs -  Physician Assistants and Nurse Practitioners) who all work together to provide you with the care you need, when you need it.  We recommend signing up for the patient portal called "MyChart".  Sign up information is provided on this After Visit Summary.  MyChart is used to connect with patients for Virtual Visits (Telemedicine).  Patients are able to view lab/test results, encounter notes, upcoming appointments, etc.  Non-urgent messages can be sent to your provider as well.   To learn more about what you can do with MyChart, go to [email or web address].    Your next appointment:   12 month(s)  The format for your next appointment:   In Person  Provider:   James Hochrein, MD   

## 2020-12-01 DIAGNOSIS — L820 Inflamed seborrheic keratosis: Secondary | ICD-10-CM | POA: Diagnosis not present

## 2020-12-02 DIAGNOSIS — T63451D Toxic effect of venom of hornets, accidental (unintentional), subsequent encounter: Secondary | ICD-10-CM | POA: Diagnosis not present

## 2020-12-02 DIAGNOSIS — T63461D Toxic effect of venom of wasps, accidental (unintentional), subsequent encounter: Secondary | ICD-10-CM | POA: Diagnosis not present

## 2020-12-08 ENCOUNTER — Ambulatory Visit (INDEPENDENT_AMBULATORY_CARE_PROVIDER_SITE_OTHER): Payer: Medicare PPO

## 2020-12-08 DIAGNOSIS — I441 Atrioventricular block, second degree: Secondary | ICD-10-CM

## 2020-12-10 LAB — CUP PACEART REMOTE DEVICE CHECK
Battery Impedance: 1482 Ohm
Battery Remaining Longevity: 46 mo
Battery Voltage: 2.76 V
Brady Statistic AP VP Percent: 27 %
Brady Statistic AP VS Percent: 0 %
Brady Statistic AS VP Percent: 73 %
Brady Statistic AS VS Percent: 0 %
Date Time Interrogation Session: 20220526150944
Implantable Lead Implant Date: 20040907
Implantable Lead Implant Date: 20040907
Implantable Lead Location: 753859
Implantable Lead Location: 753860
Implantable Lead Model: 5076
Implantable Lead Model: 5076
Implantable Pulse Generator Implant Date: 20130607
Lead Channel Impedance Value: 469 Ohm
Lead Channel Impedance Value: 729 Ohm
Lead Channel Pacing Threshold Amplitude: 0.5 V
Lead Channel Pacing Threshold Amplitude: 0.875 V
Lead Channel Pacing Threshold Pulse Width: 0.4 ms
Lead Channel Pacing Threshold Pulse Width: 0.4 ms
Lead Channel Setting Pacing Amplitude: 2 V
Lead Channel Setting Pacing Amplitude: 2.5 V
Lead Channel Setting Pacing Pulse Width: 0.4 ms
Lead Channel Setting Sensing Sensitivity: 2 mV

## 2020-12-16 ENCOUNTER — Other Ambulatory Visit: Payer: Medicare PPO

## 2020-12-22 ENCOUNTER — Other Ambulatory Visit: Payer: Self-pay | Admitting: Cardiology

## 2020-12-22 DIAGNOSIS — I255 Ischemic cardiomyopathy: Secondary | ICD-10-CM

## 2020-12-30 ENCOUNTER — Encounter: Payer: Self-pay | Admitting: Orthopedic Surgery

## 2020-12-30 ENCOUNTER — Ambulatory Visit: Payer: Medicare PPO | Admitting: Physician Assistant

## 2020-12-30 ENCOUNTER — Ambulatory Visit: Payer: Self-pay

## 2020-12-30 DIAGNOSIS — M5441 Lumbago with sciatica, right side: Secondary | ICD-10-CM

## 2020-12-30 DIAGNOSIS — G8929 Other chronic pain: Secondary | ICD-10-CM | POA: Diagnosis not present

## 2020-12-30 MED ORDER — PREDNISONE 10 MG PO TABS: 10.0000 mg | ORAL_TABLET | Freq: Every day | ORAL | 0 refills | Status: DC

## 2020-12-30 NOTE — Progress Notes (Signed)
Office Visit Note   Patient: Madeline Schaefer           Date of Birth: 1945-11-23           MRN: [id number] Visit Date: 12/30/2020              Requested by: [name], NP [address] [place],  [place] [address] PCP: [name], NP  Chief Complaint  Patient presents with   Right Leg - Pain      HPI:  Is a pleasant 75 year old woman with a chief complaint of right thigh and lower leg pain.  This began about 2 months ago and she has had to modify some of her yoga poses.  She denies any injury.  She has a vague history of some back issues in the past.  She sometimes gets some numbness in the bottom of the right foot.  She describes the pain as aching Assessment & Plan: Visit Diagnoses:  1. Chronic right-sided low back pain with right-sided sciatica     Plan: Patient will begin a low-dose of prednisone 10 mg daily with breakfast.  Follow-up in 3 weeks.  She cannot get an MRI because she has a pacemaker.  If she is no better would consider referring to Dr. Ernestina Patches to see if he would still try a epidural steroid injection.  Follow-Up Instructions: No follow-ups on file.   Ortho Exam  Patient is alert, oriented, no adenopathy, well-dressed, normal affect, normal respiratory effort. Examination demonstrates mildly tender over the mid lumbar spine.  No tenderness of the buttocks.  Some tenderness in the deep palpation in the thigh no tenderness in the calf.  She has 5 out of 5 strength with dorsiflexion and plantarflexion.  Mild pulling sensation on straight leg raise  Imaging: No results found. No images are attached to the encounter.  Labs: Lab Results  Component Value Date   HGBA1C 6.0 (H) 08/06/2015   HGBA1C 6.1 11/23/2014   HGBA1C 6.3 07/07/2014   ESRSEDRATE 31 (H) 10/12/2008   REPTSTATUS 08/20/2012 FINAL 08/19/2012   CULT  08/19/2012    Multiple bacterial morphotypes present, none predominant. Suggest appropriate recollection if  clinically indicated.     Lab Results  Component Value Date   ALBUMIN 4.2 11/21/2018   ALBUMIN 4.1 06/03/2018   ALBUMIN 3.9 07/03/2017    No results found for: MG Lab Results  Component Value Date   VD25OH 50 07/26/2012    No results found for: PREALBUMIN CBC EXTENDED Latest Ref Rng & Units 11/21/2018 08/19/2012 12/21/2011  WBC 4.0 - 10.5 K/uL 7.4 7.1 6.4  RBC 3.87 - 5.11 Mil/uL 4.93 4.78 4.82  HGB 12.0 - 15.0 g/dL 14.4 14.2 14.1  HCT 36.0 - 46.0 % 43.3 42.4 42.8  PLT 150.0 - 400.0 K/uL 248.0 217 203.0  NEUTROABS 1.4 - 7.7 K/uL 5.2 4.3 3.9  LYMPHSABS 0.7 - 4.0 K/uL 1.5 2.0 1.7     There is no height or weight on file to calculate BMI.  Orders:  Orders Placed This Encounter  Procedures   XR Lumbar Spine 2-3 Views   No orders of the defined types were placed in this encounter.    Procedures: No procedures performed  Clinical Data: No additional findings.  ROS:  All other systems negative, except as noted in the HPI. Review of Systems  Objective: Vital Signs: LMP 10/17/1986   Specialty Comments:  No specialty comments available.  PMFS History: Patient Active Problem List  Diagnosis Date Noted   Loss of weight 11/21/2018   Change in bowel habits 11/21/2018   Dyslipidemia 01/20/2017   Coronary artery disease involving native coronary artery of native heart with angina pectoris ([place]) 01/20/2017   Aortic valve regurgitation 12/15/2016   Ischemic cardiomyopathy 12/15/2016   Osteopenia 11/16/2014   Adjustment disorder with mixed anxiety and depressed mood 09/26/2013   Essential hypertension 08/21/2013   Hyperglycemia 11/20/2012   Eczema 11/20/2012   Routine general medical examination at a health care facility 11/20/2012   Second degree Mobitz II AV block 01/27/2011   PACEMAKER-[employer] 12/01/2008   Hypothyroidism 08/03/2008   Hyperlipidemia 08/03/2008   Coronary atherosclerosis 08/03/2008   Past Medical History:  Diagnosis Date   Blood transfusion  without reported diagnosis    BRONCHITIS    CAD (coronary artery disease)    LAD stent 2003 ([facility]).     Cataract    DYSPNEA    HLD (hyperlipidemia)    Hypothyroidism    Mobitz type 2 second degree heart block    s/p PPM by Dr Olevia Perches 2004   PACEMAKER, PERMANENT     Family History  Problem Relation Age of Onset   Heart attack Mother        died at age 21   Breast cancer Mother        age 84, had mastectomy.    Prostate cancer Father    Colon cancer Neg Hx    Esophageal cancer Neg Hx    Rectal cancer Neg Hx     Past Surgical History:  Procedure Laterality Date   CARDIAC CATHETERIZATION     CYSTOSCOPY WITH RETROGRADE PYELOGRAM, URETEROSCOPY AND STENT PLACEMENT  08/19/2012   Procedure: CYSTOSCOPY WITH RETROGRADE PYELOGRAM, URETEROSCOPY AND STENT PLACEMENT;  Surgeon: Molli Hazard, MD;  Location: [facility];  Service: Urology;  Laterality: Right;   MYOMECTOMY     PACEMAKER GENERATOR CHANGE N/A 12/22/2011   Procedure: PACEMAKER GENERATOR CHANGE;  Surgeon: Thompson Grayer, MD;  Location[facility] CATH LAB;  Service: Cardiovascular;  Laterality: N/A;   PACEMAKER INSERTION  2004, 12/22/11   initial implant by Dr Olevia Perches, Generator change (MDT Adapta L) by Dr Rayann Heman 6/13   TOTAL ABDOMINAL HYSTERECTOMY W/ BILATERAL SALPINGOOPHORECTOMY     Social History   Occupational History   Occupation: retired  Tobacco Use   Smoking status: Never   Smokeless tobacco: Never  Vaping Use   Vaping Use: Never used  Substance and Sexual Activity   Alcohol use: Yes    Comment: 1-2 drink daily   Drug use: No   Sexual activity: Yes

## 2020-12-31 ENCOUNTER — Other Ambulatory Visit (HOSPITAL_COMMUNITY): Payer: Medicare PPO

## 2020-12-31 DIAGNOSIS — T63461D Toxic effect of venom of wasps, accidental (unintentional), subsequent encounter: Secondary | ICD-10-CM | POA: Diagnosis not present

## 2020-12-31 DIAGNOSIS — T63441D Toxic effect of venom of bees, accidental (unintentional), subsequent encounter: Secondary | ICD-10-CM | POA: Diagnosis not present

## 2020-12-31 NOTE — Progress Notes (Signed)
Remote pacemaker transmission.   

## 2021-01-10 ENCOUNTER — Ambulatory Visit (INDEPENDENT_AMBULATORY_CARE_PROVIDER_SITE_OTHER): Payer: Medicare PPO

## 2021-01-10 ENCOUNTER — Other Ambulatory Visit: Payer: Self-pay

## 2021-01-10 DIAGNOSIS — I351 Nonrheumatic aortic (valve) insufficiency: Secondary | ICD-10-CM | POA: Diagnosis not present

## 2021-01-10 LAB — ECHOCARDIOGRAM COMPLETE
AR max vel: 2.06 cm2
AV Peak grad: 17.5 mmHg
AV Vena cont: 0.55 cm
Ao pk vel: 2.09 m/s
Area-P 1/2: 5.27 cm2
Calc EF: 56.9 %
P 1/2 time: 502 msec
S' Lateral: 2.5 cm
Single Plane A2C EF: 49.2 %
Single Plane A4C EF: 62.6 %

## 2021-01-13 ENCOUNTER — Ambulatory Visit: Payer: Medicare PPO | Admitting: Orthopedic Surgery

## 2021-02-01 DIAGNOSIS — T63461D Toxic effect of venom of wasps, accidental (unintentional), subsequent encounter: Secondary | ICD-10-CM | POA: Diagnosis not present

## 2021-02-01 DIAGNOSIS — T63451D Toxic effect of venom of hornets, accidental (unintentional), subsequent encounter: Secondary | ICD-10-CM | POA: Diagnosis not present

## 2021-03-02 ENCOUNTER — Telehealth: Payer: Self-pay | Admitting: Family

## 2021-03-02 NOTE — Telephone Encounter (Signed)
Form signed by provider and faxed back a few hours ago

## 2021-03-02 NOTE — Telephone Encounter (Signed)
[facility] Calling asking for a referral for a Bone Density test that the patient is scheduled for on Tomorrow. Please advise [phone] fax.  Pt told [facility] that she had Osteopenia from  2018

## 2021-03-03 DIAGNOSIS — Z1231 Encounter for screening mammogram for malignant neoplasm of breast: Secondary | ICD-10-CM | POA: Diagnosis not present

## 2021-03-03 DIAGNOSIS — M8589 Other specified disorders of bone density and structure, multiple sites: Secondary | ICD-10-CM | POA: Diagnosis not present

## 2021-03-03 DIAGNOSIS — Z780 Asymptomatic menopausal state: Secondary | ICD-10-CM | POA: Diagnosis not present

## 2021-03-03 LAB — HM MAMMOGRAPHY

## 2021-03-04 DIAGNOSIS — T63451D Toxic effect of venom of hornets, accidental (unintentional), subsequent encounter: Secondary | ICD-10-CM | POA: Diagnosis not present

## 2021-03-09 ENCOUNTER — Ambulatory Visit (INDEPENDENT_AMBULATORY_CARE_PROVIDER_SITE_OTHER): Payer: Medicare PPO

## 2021-03-09 DIAGNOSIS — I441 Atrioventricular block, second degree: Secondary | ICD-10-CM | POA: Diagnosis not present

## 2021-03-09 LAB — CUP PACEART REMOTE DEVICE CHECK
Battery Impedance: 1593 Ohm
Battery Remaining Longevity: 43 mo
Battery Voltage: 2.76 V
Brady Statistic AP VP Percent: 28 %
Brady Statistic AP VS Percent: 0 %
Brady Statistic AS VP Percent: 72 %
Brady Statistic AS VS Percent: 0 %
Date Time Interrogation Session: 20220824093126
Implantable Lead Implant Date: 20040907
Implantable Lead Implant Date: 20040907
Implantable Lead Location: 753859
Implantable Lead Location: 753860
Implantable Lead Model: 5076
Implantable Lead Model: 5076
Implantable Pulse Generator Implant Date: 20130607
Lead Channel Impedance Value: 432 Ohm
Lead Channel Impedance Value: 684 Ohm
Lead Channel Pacing Threshold Amplitude: 0.625 V
Lead Channel Pacing Threshold Amplitude: 0.75 V
Lead Channel Pacing Threshold Pulse Width: 0.4 ms
Lead Channel Pacing Threshold Pulse Width: 0.4 ms
Lead Channel Setting Pacing Amplitude: 2 V
Lead Channel Setting Pacing Amplitude: 2.5 V
Lead Channel Setting Pacing Pulse Width: 0.4 ms
Lead Channel Setting Sensing Sensitivity: 2 mV

## 2021-03-15 ENCOUNTER — Encounter: Payer: Self-pay | Admitting: Family

## 2021-03-16 ENCOUNTER — Encounter: Payer: Self-pay | Admitting: Family

## 2021-03-16 MED ORDER — CALCIUM CARBONATE-VITAMIN D 600-400 MG-UNIT PO TABS: 1.0000 | ORAL_TABLET | Freq: Two times a day (BID) | ORAL | Status: DC

## 2021-03-24 NOTE — Progress Notes (Signed)
Remote pacemaker transmission.   

## 2021-04-08 DIAGNOSIS — T63451D Toxic effect of venom of hornets, accidental (unintentional), subsequent encounter: Secondary | ICD-10-CM | POA: Diagnosis not present

## 2021-04-08 DIAGNOSIS — T63461D Toxic effect of venom of wasps, accidental (unintentional), subsequent encounter: Secondary | ICD-10-CM | POA: Diagnosis not present

## 2021-04-30 ENCOUNTER — Other Ambulatory Visit: Payer: Self-pay | Admitting: Family

## 2021-05-02 ENCOUNTER — Telehealth: Payer: Self-pay | Admitting: Family

## 2021-05-02 NOTE — Telephone Encounter (Signed)
Rx sent earlier today.

## 2021-05-02 NOTE — Telephone Encounter (Signed)
Pt. Wanted to make sure fax was received from pharmacy regarding medication. Pt states she is completely out.  Medication: levothyroxine (SYNTHROID) 88 MCG tablet   Has the patient contacted their pharmacy? Yes.   (If no, request that the patient contact the pharmacy for the refill.) (If yes, when and what did the pharmacy advise?)  Preferred Pharmacy (with phone number or street name): [place], [place]  [address] [place] [address]  Phone:  [phone]    Agent: Please be advised that RX refills may take up to 3 business days. We ask that you follow-up with your pharmacy.

## 2021-05-05 DIAGNOSIS — J301 Allergic rhinitis due to pollen: Secondary | ICD-10-CM | POA: Diagnosis not present

## 2021-05-05 DIAGNOSIS — T63451D Toxic effect of venom of hornets, accidental (unintentional), subsequent encounter: Secondary | ICD-10-CM | POA: Diagnosis not present

## 2021-05-05 DIAGNOSIS — H1045 Other chronic allergic conjunctivitis: Secondary | ICD-10-CM | POA: Diagnosis not present

## 2021-05-05 DIAGNOSIS — T63461D Toxic effect of venom of wasps, accidental (unintentional), subsequent encounter: Secondary | ICD-10-CM | POA: Diagnosis not present

## 2021-05-10 ENCOUNTER — Ambulatory Visit: Payer: Medicare PPO | Admitting: Family

## 2021-05-10 DIAGNOSIS — L218 Other seborrheic dermatitis: Secondary | ICD-10-CM | POA: Diagnosis not present

## 2021-05-10 DIAGNOSIS — L509 Urticaria, unspecified: Secondary | ICD-10-CM | POA: Diagnosis not present

## 2021-05-10 DIAGNOSIS — L821 Other seborrheic keratosis: Secondary | ICD-10-CM | POA: Diagnosis not present

## 2021-05-10 DIAGNOSIS — L820 Inflamed seborrheic keratosis: Secondary | ICD-10-CM | POA: Diagnosis not present

## 2021-05-10 DIAGNOSIS — D225 Melanocytic nevi of trunk: Secondary | ICD-10-CM | POA: Diagnosis not present

## 2021-05-27 ENCOUNTER — Ambulatory Visit: Payer: Medicare PPO | Admitting: Family

## 2021-05-27 ENCOUNTER — Other Ambulatory Visit: Payer: Self-pay

## 2021-05-27 VITALS — BP 146/75 | HR 62 | Temp 98.8°F | Resp 16 | Ht 70.0 in | Wt 170.0 lb

## 2021-05-27 DIAGNOSIS — F4323 Adjustment disorder with mixed anxiety and depressed mood: Secondary | ICD-10-CM

## 2021-05-27 DIAGNOSIS — E785 Hyperlipidemia, unspecified: Secondary | ICD-10-CM | POA: Diagnosis not present

## 2021-05-27 DIAGNOSIS — I10 Essential (primary) hypertension: Secondary | ICD-10-CM | POA: Diagnosis not present

## 2021-05-27 DIAGNOSIS — E039 Hypothyroidism, unspecified: Secondary | ICD-10-CM

## 2021-05-27 DIAGNOSIS — R21 Rash and other nonspecific skin eruption: Secondary | ICD-10-CM | POA: Insufficient documentation

## 2021-05-27 MED ORDER — CLOTRIMAZOLE-BETAMETHASONE 1-0.05 % EX CREA: 1.0000 "application " | TOPICAL_CREAM | Freq: Two times a day (BID) | CUTANEOUS | 0 refills | Status: AC

## 2021-05-27 MED ORDER — ATORVASTATIN CALCIUM 80 MG PO TABS: 80.0000 mg | ORAL_TABLET | Freq: Every day | ORAL | 1 refills | Status: DC

## 2021-05-27 NOTE — Addendum Note (Signed)
Addended by: [name] on: 05/27/2021 04:38 PM   Modules accepted: Level of Service

## 2021-05-27 NOTE — Progress Notes (Signed)
Subjective:   By signing my name below, I, Madeline Schaefer, attest that this documentation has been prepared under the direction and in the presence of [name], NP, 05/27/2021   Patient ID: Madeline Schaefer, female    DOB: Jul 17, 1946, 75 y.o.   MRN: [id number]  Chief Complaint  Patient presents with   Hyperlipidemia    Here for follow up   Anxiety    Here for follow up    HPI Patient is in today for an office visit.  Itching: She is complaining of an itchy rash on her left armpit. She notes that she does have eczema on her hands that flares up from time to time.  Anxiety: She mentions that she has been feeling better on the 10 mg Lexapro. She notes that before beginning the Lexapro she was very tearful at the slightest inconvenience and now she notes that she can't recall the last time she cried. She is wondering if she is able to stop taking it or taper off of it.  Colonoscopy: She is inquiring regarding her colonoscopy because she is having trouble with her gut but she is not requesting it. Her last colonoscopy was in 2015 and was recommended to have it performed again after 10 years. She will be beyond the age suggestion after 37 years so she is just going to keep an eye on it.  Blood pressure: He blood pressure is elevated in the office today but she notes that it always spikes when she comes to the doctors office. She monitors it at home and had no reports of spiked blood pressure. BP Readings from Last 3 Encounters:  05/27/21 (!) 146/75  11/30/20 (!) 150/73  09/20/20 122/62   Health Maintenance Due  Topic Date Due   COVID-19 Vaccine (5 - Booster) 06/21/2020   TETANUS/TDAP  05/01/2021    Past Medical History:  Diagnosis Date   Blood transfusion without reported diagnosis    BRONCHITIS    CAD (coronary artery disease)    LAD stent 2003 ([facility]).     Cataract    DYSPNEA    HLD (hyperlipidemia)    Hypothyroidism    Mobitz type 2 second degree heart block     s/p PPM by Dr Olevia Perches 2004   Osteopenia    PACEMAKER, PERMANENT     Past Surgical History:  Procedure Laterality Date   CARDIAC CATHETERIZATION     CYSTOSCOPY WITH RETROGRADE PYELOGRAM, URETEROSCOPY AND STENT PLACEMENT  08/19/2012   Procedure: CYSTOSCOPY WITH RETROGRADE PYELOGRAM, URETEROSCOPY AND STENT PLACEMENT;  Surgeon: Molli Hazard, MD;  Location: [facility];  Service: Urology;  Laterality: Right;   MYOMECTOMY     PACEMAKER GENERATOR CHANGE N/A 12/22/2011   Procedure: PACEMAKER GENERATOR CHANGE;  Surgeon: Thompson Grayer, MD;  Location: [facility] CATH LAB;  Service: Cardiovascular;  Laterality: N/A;   PACEMAKER INSERTION  2004, 12/22/11   initial implant by Dr Olevia Perches, Generator change (MDT Adapta L) by Dr Rayann Heman 6/13   TOTAL ABDOMINAL HYSTERECTOMY W/ BILATERAL SALPINGOOPHORECTOMY      Family History  Problem Relation Age of Onset   Heart attack Mother        died at age 66   Breast cancer Mother        age 27, had mastectomy.    Prostate cancer Father    Colon cancer Neg Hx    Esophageal cancer Neg Hx    Rectal cancer Neg Hx     Social History   Socioeconomic  History   Marital status: Married    Spouse name: Not on file   Number of children: 0   Years of education: Not on file   Highest education level: Not on file  Occupational History   Occupation: retired  Tobacco Use   Smoking status: Never   Smokeless tobacco: Never  Vaping Use   Vaping Use: Never used  Substance and Sexual Activity   Alcohol use: Yes    Comment: 1-2 drink daily   Drug use: No   Sexual activity: Yes  Other Topics Concern   Not on file  Social History Narrative   Retired Furniture conservator/restorer   Enjoys RV, bikes   No children   No pets   Social Determinants of Radio broadcast assistant Strain: Not on file  Food Insecurity: Not on file  Transportation Needs: Not on file  Physical Activity: Not on file  Stress: Not on file  Social Connections: Not on file  Intimate  Partner Violence: Not on file    Outpatient Medications Prior to Visit  Medication Sig Dispense Refill   aspirin EC 81 MG tablet Take 81 mg by mouth daily.     Calcium Carbonate-Vitamin D 600-400 MG-UNIT tablet Take 1 tablet by mouth 2 (two) times daily.     carvedilol (COREG) 3.125 MG tablet Take 1 tablet (3.125 mg total) by mouth 2 (two) times daily with a meal. 90 tablet 3   clobetasol ointment (TEMOVATE) 3.81 % Apply 1 application topically daily as needed (eczema).      EPINEPHrine 0.3 mg/0.3 mL IJ SOAJ injection Inject 0.3 mLs (0.3 mg total) into the muscle once. 2 Device 1   escitalopram (LEXAPRO) 10 MG tablet Take 1 tablet by mouth once daily 90 tablet 0   ibuprofen (ADVIL) 200 MG tablet Take 2 tablets (400 mg total) by mouth every 8 (eight) hours as needed for moderate pain (with food). 30 tablet 0   levothyroxine (SYNTHROID) 88 MCG tablet Take 1 tablet by mouth once daily 90 tablet 0   losartan (COZAAR) 25 MG tablet Take 1 tablet (25 mg total) by mouth daily. 90 tablet 1   atorvastatin (LIPITOR) 80 MG tablet Take 1 tablet (80 mg total) by mouth daily. 90 tablet 1   dicyclomine (BENTYL) 20 MG tablet TAKE 1 TABLET BY MOUTH TWICE DAILY AS NEEDED FOR SPASMS 180 tablet 0   predniSONE (DELTASONE) 10 MG tablet Take 1 tablet (10 mg total) by mouth daily with breakfast. 30 tablet 0   Facility-Administered Medications Prior to Visit  Medication Dose Route Frequency Provider Last Rate Last Admin   triamcinolone acetonide (KENALOG) 10 MG/ML injection 10 mg  10 mg Other Once Landis Martins, DPM        Allergies  Allergen Reactions   Bee Venom Anaphylaxis   Codeine Other (See Comments)    REACTION: Passed  out, facial swelling and hives REACTION: Passed  out, facial swelling and hives   Pneumovax 23 [Pneumococcal Vac Polyvalent] Swelling    Local swelling and pain   Lisinopril     COUGH   Hornet Venom Hives    Hives over entire body   Sulfonamide Derivatives Hives    Review of  Systems  Skin:  Positive for rash (itchy rash under left axilla).  Psychiatric/Behavioral:  The patient is not nervous/anxious (resolved).       Objective:    Physical Exam Constitutional:      General: She is not in acute distress.  Appearance: Normal appearance. She is not ill-appearing.  HENT:     Head: Normocephalic and atraumatic.     Right Ear: External ear normal.     Left Ear: External ear normal.  Eyes:     Extraocular Movements: Extraocular movements intact.     Pupils: Pupils are equal, round, and reactive to light.  Cardiovascular:     Rate and Rhythm: Normal rate and regular rhythm.     Heart sounds: Normal heart sounds. No murmur heard.   No gallop.  Pulmonary:     Effort: Pulmonary effort is normal. No respiratory distress.     Breath sounds: Normal breath sounds. No wheezing or rales.  Lymphadenopathy:     Cervical: No cervical adenopathy.  Skin:    General: Skin is warm and dry.     Findings: Rash (Erythemas on left axilla) present.  Neurological:     Mental Status: She is alert and oriented to person, place, and time.  Psychiatric:        Behavior: Behavior normal.        Judgment: Judgment normal.    BP (!) 146/75 (BP Location: Right Arm, Patient Position: Sitting, Cuff Size: Small)   Pulse 62   Temp 98.8 F (37.1 C) (Oral)   Resp 16   Ht _0  (1.778 m)   Wt 170 lb (77.1 kg)   LMP 10/17/1986   BMI 24.39 kg/m  Wt Readings from Last 3 Encounters:  05/27/21 170 lb (77.1 kg)  11/30/20 168 lb 9.6 oz (76.5 kg)  09/20/20 165 lb (74.8 kg)       Assessment & Plan:   Problem List Items Addressed This Visit       Unprioritized   Skin rash - Primary    New, left axilla.  Will give trial of lotrisone cream.       Hypothyroidism    Continue synthroid 88 mcg.       Relevant Orders   TSH   Hyperlipidemia    Lab Results  Component Value Date   CHOL 159 07/16/2019   HDL 48.10 07/16/2019   LDLCALC 91 07/16/2019   TRIG 99.0 07/16/2019    CHOLHDL 3 07/16/2019        Relevant Medications   atorvastatin (LIPITOR) 80 MG tablet   Other Relevant Orders   Lipid panel   Essential hypertension    BP Readings from Last 3 Encounters:  05/27/21 (!) 146/75  11/30/20 (!) 150/73  09/20/20 122/62  Acceptabel for her age. Continue losaran 75m.        Relevant Medications   atorvastatin (LIPITOR) 80 MG tablet   Other Relevant Orders   Comp Met (CMET)   Adjustment disorder with mixed anxiety and depressed mood    Stable, wishes to come off of lexapro. Will decrease lexapro to 59mand she will let me know how she feels in 1 month.       Meds ordered this encounter  Medications   clotrimazole-betamethasone (LOTRISONE) cream    Sig: Apply 1 application topically 2 (two) times daily.    Dispense:  30 g    Refill:  0    Order Specific Question:   Supervising Provider    Answer:   BLPenni Homans [[id number]]   atorvastatin (LIPITOR) 80 MG tablet    Sig: Take 1 tablet (80 mg total) by mouth daily.    Dispense:  90 tablet    Refill:  1    Order Specific Question:   Supervising  Provider    Answer:   Mosie Lukes [[id number]]    I, [name], NP, personally preformed the services described in this documentation.  All medical record entries made by the scribe were at my direction and in my presence.  I have reviewed the chart and discharge instructions (if applicable) and agree that the record reflects my personal performance and is accurate and complete. 05/27/2021  I,Madeline Schaefer,acting as a Education administrator for Nance Pear, NP.,have documented all relevant documentation on the behalf of Nance Pear, NP,as directed by  Nance Pear, NP while in the presence of Nance Pear, NP.  Nance Pear, NP

## 2021-05-27 NOTE — Assessment & Plan Note (Signed)
Stable, wishes to come off of lexapro. Will decrease lexapro to 5mg  and she will let me know how she feels in 1 month.

## 2021-05-27 NOTE — Assessment & Plan Note (Signed)
New, left axilla.  Will give trial of lotrisone cream.

## 2021-05-27 NOTE — Assessment & Plan Note (Signed)
Continue synthroid 88 mcg.

## 2021-05-27 NOTE — Assessment & Plan Note (Signed)
BP Readings from Last 3 Encounters:  05/27/21 (!) 146/75  11/30/20 (!) 150/73  09/20/20 122/62   Acceptabel for her age. Continue losaran 25mg .

## 2021-05-27 NOTE — Patient Instructions (Signed)
Please complete lab work prior to leaving.   

## 2021-05-27 NOTE — Assessment & Plan Note (Signed)
Lab Results  Component Value Date   CHOL 159 07/16/2019   HDL 48.10 07/16/2019   LDLCALC 91 07/16/2019   TRIG 99.0 07/16/2019   CHOLHDL 3 07/16/2019

## 2021-05-28 LAB — COMPREHENSIVE METABOLIC PANEL
AG Ratio: 1.5 (calc) (ref 1.0–2.5)
ALT: 18 U/L (ref 6–29)
AST: 18 U/L (ref 10–35)
Albumin: 4 g/dL (ref 3.6–5.1)
Alkaline phosphatase (APISO): 71 U/L (ref 37–153)
BUN: 24 mg/dL (ref 7–25)
CO2: 26 mmol/L (ref 20–32)
Calcium: 8.7 mg/dL (ref 8.6–10.4)
Chloride: 103 mmol/L (ref 98–110)
Creat: 0.73 mg/dL (ref 0.60–1.00)
Globulin: 2.6 g/dL (calc) (ref 1.9–3.7)
Glucose, Bld: 89 mg/dL (ref 65–99)
Potassium: 4.1 mmol/L (ref 3.5–5.3)
Sodium: 137 mmol/L (ref 135–146)
Total Bilirubin: 0.5 mg/dL (ref 0.2–1.2)
Total Protein: 6.6 g/dL (ref 6.1–8.1)

## 2021-05-28 LAB — LIPID PANEL
Cholesterol: 137 mg/dL (ref <200)
HDL: 46 mg/dL — ABNORMAL LOW (ref >=50)
LDL Cholesterol (Calc): 73 mg/dL (calc)
Non-HDL Cholesterol (Calc): 91 mg/dL (calc) (ref <130)
Total CHOL/HDL Ratio: 3 (calc) (ref <5.0)
Triglycerides: 97 mg/dL (ref <150)

## 2021-05-28 LAB — TSH: TSH: 0.49 mIU/L (ref 0.40–4.50)

## 2021-06-07 DIAGNOSIS — T63441D Toxic effect of venom of bees, accidental (unintentional), subsequent encounter: Secondary | ICD-10-CM | POA: Diagnosis not present

## 2021-06-07 DIAGNOSIS — T63461D Toxic effect of venom of wasps, accidental (unintentional), subsequent encounter: Secondary | ICD-10-CM | POA: Diagnosis not present

## 2021-06-08 ENCOUNTER — Ambulatory Visit (INDEPENDENT_AMBULATORY_CARE_PROVIDER_SITE_OTHER): Payer: Medicare PPO

## 2021-06-08 DIAGNOSIS — I441 Atrioventricular block, second degree: Secondary | ICD-10-CM | POA: Diagnosis not present

## 2021-06-12 LAB — CUP PACEART REMOTE DEVICE CHECK
Battery Impedance: 1652 Ohm
Battery Remaining Longevity: 42 mo
Battery Voltage: 2.75 V
Brady Statistic AP VP Percent: 28 %
Brady Statistic AP VS Percent: 0 %
Brady Statistic AS VP Percent: 72 %
Brady Statistic AS VS Percent: 0 %
Date Time Interrogation Session: 20221123124945
Implantable Lead Implant Date: 20040907
Implantable Lead Implant Date: 20040907
Implantable Lead Location: 753859
Implantable Lead Location: 753860
Implantable Lead Model: 5076
Implantable Lead Model: 5076
Implantable Pulse Generator Implant Date: 20130607
Lead Channel Impedance Value: 462 Ohm
Lead Channel Impedance Value: 703 Ohm
Lead Channel Pacing Threshold Amplitude: 0.625 V
Lead Channel Pacing Threshold Amplitude: 0.75 V
Lead Channel Pacing Threshold Pulse Width: 0.4 ms
Lead Channel Pacing Threshold Pulse Width: 0.4 ms
Lead Channel Setting Pacing Amplitude: 2 V
Lead Channel Setting Pacing Amplitude: 2.5 V
Lead Channel Setting Pacing Pulse Width: 0.4 ms
Lead Channel Setting Sensing Sensitivity: 2 mV

## 2021-06-13 ENCOUNTER — Encounter: Payer: Self-pay | Admitting: Family

## 2021-06-17 NOTE — Progress Notes (Signed)
Remote pacemaker transmission.   

## 2021-06-19 ENCOUNTER — Other Ambulatory Visit: Payer: Self-pay | Admitting: Cardiology

## 2021-06-19 DIAGNOSIS — I255 Ischemic cardiomyopathy: Secondary | ICD-10-CM

## 2021-06-28 ENCOUNTER — Encounter: Payer: Self-pay | Admitting: Family

## 2021-06-28 ENCOUNTER — Telehealth: Payer: Self-pay | Admitting: Family

## 2021-06-28 MED ORDER — MOLNUPIRAVIR 200 MG PO CAPS
4.0000 | ORAL_CAPSULE | Freq: Two times a day (BID) | ORAL | 0 refills | Status: AC
Start: 2021-06-28 — End: 2021-07-03

## 2021-06-28 NOTE — Telephone Encounter (Signed)
Spoke with pt earlier today during her husband's virtual visit. Her symptoms began 2 days ago and she and her husband have both tested positive for covid.  See mychart.

## 2021-06-28 NOTE — Telephone Encounter (Signed)
Pt called in to let us know she is covid positive as well. She would like her medication called in so she can pick up hers and her husbands medication at the same time.  [facility] [address], [place] - [place]  [address] [place] [address]  Phone:  [phone]  Fax:  [phone]

## 2021-06-28 NOTE — Telephone Encounter (Signed)
Provider already informed of this with a previous message.

## 2021-06-28 NOTE — Telephone Encounter (Signed)
Will see Madeline Schaefer today as a vv

## 2021-06-30 ENCOUNTER — Encounter: Payer: Self-pay | Admitting: Family

## 2021-07-01 ENCOUNTER — Telehealth: Payer: Self-pay

## 2021-07-01 NOTE — Telephone Encounter (Signed)
Nurse Assessment Nurse: Claiborne Billings, RN, Kim Date/Time (Eastern Time): 06/30/2021 12:19:50 PM Confirm and document reason for call. If symptomatic, describe symptoms. ---Caller states she was prescribe molnupiravir, has had 3 doses and has developed a faint red rash on her stomach, left arm and back; just noticed this today. She states she has not had a bad taste in her mouth. Rash is not itchy. No trouble swallowing or breathing. Does the patient have any new or worsening symptoms? ---Yes Will a triage be completed? ---Yes Related visit to physician within the last 2 weeks? ---Yes Does the PT have any chronic conditions? (i.e. diabetes, asthma, this includes High risk factors for pregnancy, etc.) ---Yes List chronic conditions. ---HTN, pacemaker Is this a behavioral health or substance abuse call? ---No Guidelines Guideline Title Affirmed Question Affirmed Notes Nurse Date/Time Eilene Ghazi Time) Rash - Widespread On Drugs Hives or itching Suzette Battiest 06/30/2021 12:25:15 PM Disp. Time Eilene Ghazi Time) Disposition Final User 06/30/2021 12:27:44 PM See PCP within 24 Hours Yes Claiborne Billings, RN, Maudie Mercury PLEASE NOTE: All timestamps contained within this report are represented as [place] Standard Time. CONFIDENTIALTY NOTICE: This fax transmission is intended only for the addressee. It contains information that is legally privileged, confidential or otherwise protected from use or disclosure. If you are not the intended recipient, you are strictly prohibited from reviewing, disclosing, copying using or disseminating any of this information or taking any action in reliance on or regarding this information. If you have received this fax in error, please notify us immediately by telephone so that we can arrange for its return to [place]. Phone: [phone], Toll-Free: [phone], Fax: [phone] Page: 2 of 2 Call Id: [id number] [place] Disagree/Comply Comply Caller Understands Yes PreDisposition Did not know  what to do Care Advice Given Per Guideline SEE PCP WITHIN 24 HOURS: * IF OFFICE WILL BE OPEN: You need to be examined within the next 24 hours. Call your doctor (or NP/PA) when the office opens and make an appointment. STOP THE MEDICINE: * Severe itching and rash can be a sign of a drug allergy. ANTIHISTAMINE MEDICINES FOR HIVES OR FOR SEVERE ITCHING: * For severe itching, take either cetirizine or loratadine. * They are over-the-counter (OTC) antihistamine medicines. You can buy them at the drugstore. * CETIRIZINE (REACTINE, ZYRTEC): The adult dose is 10 mg and you take it once a day. Cetirizine is available in the [place] as Zyrtec and in [place] as Reactine. CALL BACK IF: * You become worse CARE ADVICE given per Rash - Widespread on Drugs (Adult) guideline Referrals REFERRED TO PCP OFFICE

## 2021-07-01 NOTE — Telephone Encounter (Signed)
This information was sent to provider earlier and she  contacted patient about this.

## 2021-07-01 NOTE — Telephone Encounter (Signed)
Caller states that she needs to schedule an appointment. Declined triage.  Telephone: [phone]

## 2021-07-31 ENCOUNTER — Encounter: Payer: Self-pay | Admitting: Family

## 2021-08-01 ENCOUNTER — Other Ambulatory Visit: Payer: Self-pay

## 2021-08-01 DIAGNOSIS — Z6824 Body mass index (BMI) 24.0-24.9, adult: Secondary | ICD-10-CM | POA: Diagnosis not present

## 2021-08-01 DIAGNOSIS — T63451D Toxic effect of venom of hornets, accidental (unintentional), subsequent encounter: Secondary | ICD-10-CM | POA: Diagnosis not present

## 2021-08-01 MED ORDER — LEVOTHYROXINE SODIUM 88 MCG PO TABS: 88.0000 ug | ORAL_TABLET | Freq: Every day | ORAL | 0 refills | Status: DC

## 2021-08-05 DIAGNOSIS — I10 Essential (primary) hypertension: Secondary | ICD-10-CM | POA: Diagnosis not present

## 2021-08-05 DIAGNOSIS — E038 Other specified hypothyroidism: Secondary | ICD-10-CM | POA: Diagnosis not present

## 2021-08-05 DIAGNOSIS — Z6824 Body mass index (BMI) 24.0-24.9, adult: Secondary | ICD-10-CM | POA: Diagnosis not present

## 2021-08-05 DIAGNOSIS — Z0001 Encounter for general adult medical examination with abnormal findings: Secondary | ICD-10-CM | POA: Diagnosis not present

## 2021-08-17 DIAGNOSIS — T63454D Toxic effect of venom of hornets, undetermined, subsequent encounter: Secondary | ICD-10-CM | POA: Diagnosis not present

## 2021-08-17 DIAGNOSIS — T63464D Toxic effect of venom of wasps, undetermined, subsequent encounter: Secondary | ICD-10-CM | POA: Diagnosis not present

## 2021-09-07 ENCOUNTER — Ambulatory Visit (INDEPENDENT_AMBULATORY_CARE_PROVIDER_SITE_OTHER): Payer: Medicare PPO

## 2021-09-07 DIAGNOSIS — I441 Atrioventricular block, second degree: Secondary | ICD-10-CM | POA: Diagnosis not present

## 2021-09-09 LAB — CUP PACEART REMOTE DEVICE CHECK
Battery Impedance: 1765 Ohm
Battery Remaining Longevity: 40 mo
Battery Voltage: 2.75 V
Brady Statistic AP VP Percent: 28 %
Brady Statistic AP VS Percent: 0 %
Brady Statistic AS VP Percent: 72 %
Brady Statistic AS VS Percent: 0 %
Date Time Interrogation Session: 20230223122215
Implantable Lead Implant Date: 20040907
Implantable Lead Implant Date: 20040907
Implantable Lead Location: 753859
Implantable Lead Location: 753860
Implantable Lead Model: 5076
Implantable Lead Model: 5076
Implantable Pulse Generator Implant Date: 20130607
Lead Channel Impedance Value: 438 Ohm
Lead Channel Impedance Value: 717 Ohm
Lead Channel Pacing Threshold Amplitude: 0.625 V
Lead Channel Pacing Threshold Amplitude: 0.75 V
Lead Channel Pacing Threshold Pulse Width: 0.4 ms
Lead Channel Pacing Threshold Pulse Width: 0.4 ms
Lead Channel Setting Pacing Amplitude: 2 V
Lead Channel Setting Pacing Amplitude: 2.5 V
Lead Channel Setting Pacing Pulse Width: 0.4 ms
Lead Channel Setting Sensing Sensitivity: 2 mV

## 2021-09-14 DIAGNOSIS — T63454D Toxic effect of venom of hornets, undetermined, subsequent encounter: Secondary | ICD-10-CM | POA: Diagnosis not present

## 2021-09-14 DIAGNOSIS — T63464D Toxic effect of venom of wasps, undetermined, subsequent encounter: Secondary | ICD-10-CM | POA: Diagnosis not present

## 2021-09-14 NOTE — Progress Notes (Signed)
Remote pacemaker transmission.   

## 2021-09-24 ENCOUNTER — Other Ambulatory Visit: Payer: Self-pay | Admitting: Cardiology

## 2021-09-24 DIAGNOSIS — I255 Ischemic cardiomyopathy: Secondary | ICD-10-CM

## 2021-09-26 ENCOUNTER — Telehealth: Payer: Self-pay | Admitting: Cardiology

## 2021-09-26 ENCOUNTER — Telehealth: Payer: Self-pay

## 2021-09-26 NOTE — Telephone Encounter (Signed)
?*  STAT* If patient is at the pharmacy, call can be transferred to refill team. ? ? ?1. Which medications need to be refilled? (please list name of each medication and dose if known)  ?carvedilol (COREG) 3.125 MG tablet ? ?2. Which pharmacy/location (including street and city if local pharmacy) is medication to be sent to? ?[place], [place] ? ?3. Do they need a 30 day or 90 day supply?  ?90 day supply ? ?Patient states she is completely out of medication. ? ?

## 2021-09-26 NOTE — Telephone Encounter (Signed)
Pt called stating she needs a refill on Lexapro 10 MG Tab.  She did try to half the dose as discussed with PCP, but she really needs the 10 MG dosage.  Pharmacy is [facility] on [address]. ?

## 2021-09-27 MED ORDER — ESCITALOPRAM OXALATE 10 MG PO TABS: 10.0000 mg | ORAL_TABLET | Freq: Every day | ORAL | 1 refills | Status: DC

## 2021-09-28 DIAGNOSIS — D1801 Hemangioma of skin and subcutaneous tissue: Secondary | ICD-10-CM | POA: Diagnosis not present

## 2021-09-28 DIAGNOSIS — D692 Other nonthrombocytopenic purpura: Secondary | ICD-10-CM | POA: Diagnosis not present

## 2021-09-28 DIAGNOSIS — L821 Other seborrheic keratosis: Secondary | ICD-10-CM | POA: Diagnosis not present

## 2021-09-28 DIAGNOSIS — L218 Other seborrheic dermatitis: Secondary | ICD-10-CM | POA: Diagnosis not present

## 2021-09-28 DIAGNOSIS — L570 Actinic keratosis: Secondary | ICD-10-CM | POA: Diagnosis not present

## 2021-09-28 DIAGNOSIS — L820 Inflamed seborrheic keratosis: Secondary | ICD-10-CM | POA: Diagnosis not present

## 2021-10-17 ENCOUNTER — Ambulatory Visit: Payer: Medicare PPO | Admitting: Adult Health

## 2021-10-23 ENCOUNTER — Other Ambulatory Visit: Payer: Self-pay | Admitting: Family

## 2021-10-23 ENCOUNTER — Encounter: Payer: Self-pay | Admitting: Family

## 2021-10-24 MED ORDER — LEVOTHYROXINE SODIUM 88 MCG PO TABS
88.0000 ug | ORAL_TABLET | Freq: Every day | ORAL | 0 refills | Status: DC
Start: 2021-10-24 — End: 2021-12-19

## 2021-10-25 DIAGNOSIS — T63461D Toxic effect of venom of wasps, accidental (unintentional), subsequent encounter: Secondary | ICD-10-CM | POA: Diagnosis not present

## 2021-10-25 DIAGNOSIS — T63441D Toxic effect of venom of bees, accidental (unintentional), subsequent encounter: Secondary | ICD-10-CM | POA: Diagnosis not present

## 2021-10-25 DIAGNOSIS — T63451D Toxic effect of venom of hornets, accidental (unintentional), subsequent encounter: Secondary | ICD-10-CM | POA: Diagnosis not present

## 2021-11-01 NOTE — Progress Notes (Signed)
?Electrophysiology Office Follow up Visit Note:   ? ?Date:  11/02/2021  ? ?ID:  Madeline Schaefer, DOB Jul 06, 1946, MRN [id number] ? ?PCP:  Madeline Alar, NP  ?[place] Cardiologist:  None  ?[facility] Electrophysiologist:  Vickie Epley, MD  ? ? ?Interval History:   ? ?Madeline Schaefer is a 76 y.o. female who presents for a follow up visit. They were last seen in clinic by Dr Percival Spanish 11/30/2020. She has a history of CAD, Mobitz II s/p PPM.  ? ?Today she tells me she is having some exertional chest pressure and shortness of breath.  The chest pressure really is minimal but she does tell me that she has noticed that with more vigorous exercise she will experience shortness of breath and have to stop before restarting.  This is similar to the episode that led up to her getting her first stent.  No syncope or presyncope.  No trouble with the pacemaker. ? ?  ? ?Past Medical History:  ?Diagnosis Date  ? Blood transfusion without reported diagnosis   ? BRONCHITIS   ? CAD (coronary artery disease)   ? LAD stent 2003 ([facility]).    ? Cataract   ? DYSPNEA   ? HLD (hyperlipidemia)   ? Hypothyroidism   ? Mobitz type 2 second degree heart block   ? s/p PPM by Dr Olevia Perches 2004  ? Osteopenia   ? PACEMAKER, PERMANENT   ? ? ?Past Surgical History:  ?Procedure Laterality Date  ? CARDIAC CATHETERIZATION    ? CYSTOSCOPY WITH RETROGRADE PYELOGRAM, URETEROSCOPY AND STENT PLACEMENT  08/19/2012  ? Procedure: CYSTOSCOPY WITH RETROGRADE PYELOGRAM, URETEROSCOPY AND STENT PLACEMENT;  Surgeon: Molli Hazard, MD;  Location: [facility];  Service: Urology;  Laterality: Right;  ? MYOMECTOMY    ? PACEMAKER GENERATOR CHANGE N/A 12/22/2011  ? Procedure: PACEMAKER GENERATOR CHANGE;  Surgeon: Thompson Grayer, MD;  Location: [facility] CATH LAB;  Service: Cardiovascular;  Laterality: N/A;  ? PACEMAKER INSERTION  2004, 12/22/11  ? initial implant by Dr Olevia Perches, Generator change (MDT Adapta L) by Dr Rayann Heman 6/13  ? TOTAL ABDOMINAL  HYSTERECTOMY W/ BILATERAL SALPINGOOPHORECTOMY    ? ? ?Current Medications: ?Current Meds  ?Medication Sig  ? aspirin EC 81 MG tablet Take 81 mg by mouth daily.  ? atorvastatin (LIPITOR) 80 MG tablet Take 1 tablet (80 mg total) by mouth daily.  ? carvedilol (COREG) 3.125 MG tablet TAKE 1 TABLET BY MOUTH TWICE DAILY WITH A MEAL  ? clobetasol ointment (TEMOVATE) 2.53 % Apply 1 application topically daily as needed (eczema).   ? clotrimazole-betamethasone (LOTRISONE) cream Apply 1 application topically 2 (two) times daily.  ? EPINEPHrine 0.3 mg/0.3 mL IJ SOAJ injection Inject 0.3 mLs (0.3 mg total) into the muscle once.  ? escitalopram (LEXAPRO) 10 MG tablet Take 1 tablet (10 mg total) by mouth daily.  ? fexofenadine (ALLEGRA) 180 MG tablet 1 tablet Swallow whole with water; do not take with fruit juices.  ? ibuprofen (ADVIL) 200 MG tablet Take 2 tablets (400 mg total) by mouth every 8 (eight) hours as needed for moderate pain (with food).  ? levothyroxine (SYNTHROID) 88 MCG tablet Take 1 tablet (88 mcg total) by mouth daily.  ? losartan (COZAAR) 25 MG tablet Take 1 tablet (25 mg total) by mouth daily. (Patient taking differently: Take 12.5 mg by mouth daily.)  ? ?Current Facility-Administered Medications for the 11/02/21 encounter (Office Visit) with Vickie Epley, MD  ?Medication  ? triamcinolone acetonide (KENALOG) 10  MG/ML injection 10 mg  ?  ? ?Allergies:   Bee venom, Codeine, Pneumovax 23 [pneumococcal vac polyvalent], Lisinopril, Hornet venom, and Sulfonamide derivatives  ? ?Social History  ? ?Socioeconomic History  ? Marital status: Married  ?  Spouse name: Not on file  ? Number of children: 0  ? Years of education: Not on file  ? Highest education level: Not on file  ?Occupational History  ? Occupation: retired  ?Tobacco Use  ? Smoking status: Never  ? Smokeless tobacco: Never  ?Vaping Use  ? Vaping Use: Never used  ?Substance and Sexual Activity  ? Alcohol use: Yes  ?  Comment: 1-2 drink daily  ? Drug use:  No  ? Sexual activity: Yes  ?Other Topics Concern  ? Not on file  ?Social History Narrative  ? Retired Furniture conservator/restorer  ? Enjoys RV, bikes  ? No children  ? No pets  ? ?Social Determinants of Health  ? ?Financial Resource Strain: Not on file  ?Food Insecurity: Not on file  ?Transportation Needs: Not on file  ?Physical Activity: Not on file  ?Stress: Not on file  ?Social Connections: Not on file  ?  ? ?Family History: ?The patient's family history includes Breast cancer in her mother; Heart attack in her mother; Prostate cancer in her father. There is no history of Colon cancer, Esophageal cancer, or Rectal cancer. ? ?ROS:   ?Please see the history of present illness.    ?All other systems reviewed and are negative. ? ?EKGs/Labs/Other Studies Reviewed:   ? ?The following studies were reviewed today: ? ?November 02, 2021 in clinic device interrogation personally reviewed ?Battery longevity 3 years ?Lead parameter stable ?No underlying rhythm at VVI 30 ?No A-fib ?60-1 30 ? ? ? ?Recent Labs: ?05/27/2021: ALT 18; BUN 24; Creat 0.73; Potassium 4.1; Sodium 137; TSH 0.49  ?Recent Lipid Panel ?   ?Component Value Date/Time  ? CHOL 137 05/27/2021 1535  ? CHOL 128 01/12/2017 1544  ? TRIG 97 05/27/2021 1535  ? HDL 46 (L) 05/27/2021 1535  ? HDL 51 01/12/2017 1544  ? CHOLHDL 3.0 05/27/2021 1535  ? VLDL 19.8 07/16/2019 1446  ? [place] 73 05/27/2021 1535  ? ? ?Physical Exam:   ? ?VS:  BP 134/74 (BP Location: Left Arm, Patient Position: Sitting, Cuff Size: Normal)   Pulse 60   Ht '5\' 10"'$  (1.778 m)   Wt 165 lb (74.8 kg)   LMP 10/17/1986   SpO2 98%   BMI 23.68 kg/m?    ? ?Wt Readings from Last 3 Encounters:  ?11/02/21 165 lb (74.8 kg)  ?05/27/21 170 lb (77.1 kg)  ?11/30/20 168 lb 9.6 oz (76.5 kg)  ?  ? ?GEN:  Well nourished, well developed in no acute distress.  Appears younger than the stated age ?HEENT: Normal ?NECK: No JVD; No carotid bruits ?LYMPHATICS: No lymphadenopathy ?CARDIAC: RRR, no murmurs, rubs, gallops.  Pacemaker  pocket well-healed ?RESPIRATORY:  Clear to auscultation without rales, wheezing or rhonchi  ?ABDOMEN: Soft, non-tender, non-distended ?MUSCULOSKELETAL:  No edema; No deformity  ?SKIN: Warm and dry ?NEUROLOGIC:  Alert and oriented x 3 ?PSYCHIATRIC:  Normal affect  ? ? ? ?  ? ?ASSESSMENT:   ? ?1. Dyspnea on exertion   ?2. Second degree Mobitz II AV block   ?3. Cardiac pacemaker in situ   ?4. Coronary artery disease involving native coronary artery of native heart without angina pectoris   ? ?PLAN:   ? ?In order of problems listed above: ? ?#  Dyspnea on exertion ?#Coronary artery disease ?Patient with dyspnea on exertion which is new and similar to her first stent symptoms.  I would like to get her a stress test.  We will order a PET stress.  Follow-up 6 to 8 weeks from now after her stress test.  APP appointment okay. ? ?#Complete heart block ?#Permanent pacemaker in situ ?Device functioning appropriately ? ? ? ? ?Medication Adjustments/Labs and Tests Ordered: ?Current medicines are reviewed at length with the patient today.  Concerns regarding medicines are outlined above.  ?No orders of the defined types were placed in this encounter. ? ?No orders of the defined types were placed in this encounter. ? ? ? ?Signed, ?Lars Mage, MD, [facility], [place] ?11/02/2021 9:50 AM    ?Electrophysiology ?[place] ?

## 2021-11-02 ENCOUNTER — Encounter: Payer: Self-pay | Admitting: Cardiology

## 2021-11-02 ENCOUNTER — Ambulatory Visit (INDEPENDENT_AMBULATORY_CARE_PROVIDER_SITE_OTHER): Payer: Medicare PPO | Admitting: Cardiology

## 2021-11-02 ENCOUNTER — Encounter: Payer: Self-pay | Admitting: *Deleted

## 2021-11-02 VITALS — BP 134/74 | HR 60 | Ht 70.0 in | Wt 165.0 lb

## 2021-11-02 DIAGNOSIS — R079 Chest pain, unspecified: Secondary | ICD-10-CM

## 2021-11-02 DIAGNOSIS — Z950 Presence of cardiac pacemaker: Secondary | ICD-10-CM | POA: Diagnosis not present

## 2021-11-02 DIAGNOSIS — I441 Atrioventricular block, second degree: Secondary | ICD-10-CM | POA: Diagnosis not present

## 2021-11-02 DIAGNOSIS — I2510 Atherosclerotic heart disease of native coronary artery without angina pectoris: Secondary | ICD-10-CM

## 2021-11-02 DIAGNOSIS — R0609 Other forms of dyspnea: Secondary | ICD-10-CM | POA: Diagnosis not present

## 2021-11-02 LAB — CUP PACEART INCLINIC DEVICE CHECK
Battery Impedance: 1861 Ohm
Battery Remaining Longevity: 37 mo
Battery Voltage: 2.75 V
Brady Statistic AP VP Percent: 28 %
Brady Statistic AP VS Percent: 0 %
Brady Statistic AS VP Percent: 72 %
Brady Statistic AS VS Percent: 0 %
Date Time Interrogation Session: 20230419094815
Implantable Lead Implant Date: 20040907
Implantable Lead Implant Date: 20040907
Implantable Lead Location: 753859
Implantable Lead Location: 753860
Implantable Lead Model: 5076
Implantable Lead Model: 5076
Implantable Pulse Generator Implant Date: 20130607
Lead Channel Impedance Value: 430 Ohm
Lead Channel Impedance Value: 662 Ohm
Lead Channel Pacing Threshold Amplitude: 0.5 V
Lead Channel Pacing Threshold Amplitude: 0.75 V
Lead Channel Pacing Threshold Amplitude: 0.75 V
Lead Channel Pacing Threshold Amplitude: 0.75 V
Lead Channel Pacing Threshold Pulse Width: 0.4 ms
Lead Channel Pacing Threshold Pulse Width: 0.4 ms
Lead Channel Pacing Threshold Pulse Width: 0.4 ms
Lead Channel Pacing Threshold Pulse Width: 0.4 ms
Lead Channel Sensing Intrinsic Amplitude: 1 mV
Lead Channel Setting Pacing Amplitude: 2 V
Lead Channel Setting Pacing Amplitude: 2.5 V
Lead Channel Setting Pacing Pulse Width: 0.4 ms
Lead Channel Setting Sensing Sensitivity: 2 mV

## 2021-11-02 LAB — PACEMAKER DEVICE OBSERVATION

## 2021-11-02 NOTE — Patient Instructions (Addendum)
Medications: ?Your physician recommends that you continue on your current medications as directed. Please refer to the Current Medication list given to you today. ?*If you need a refill on your cardiac medications before your next appointment, please call your pharmacy* ? ?Lab Work: ?None. ?If you have labs (blood work) drawn today and your tests are completely normal, you will receive your results only by: ?MyChart Message (if you have MyChart) OR ?A paper copy in the mail ?If you have any lab test that is abnormal or we need to change your treatment, we will call you to review the results. ? ?Testing/Procedures: ?Cardiac PET/CT ? ?Follow-Up: ?At [facility], you and your health needs are our priority.  As part of our continuing mission to provide you with exceptional heart care, we have created designated Provider Care Teams.  These Care Teams include your primary Cardiologist (physician) and Advanced Practice Providers (APPs -  Physician Assistants and Nurse Practitioners) who all work together to provide you with the care you need, when you need it. ? ?Your physician wants you to follow-up in: 6-8 weeks with one of the following Advanced Practice Providers on your designated Care Team:   ? ?Ignacia Bayley, NP ?Christell Faith PA ?Cadence Kathlen Mody PA ? ?  You will receive a reminder letter in the mail two months in advance. If you don't receive a letter, please call our office to schedule the follow-up appointment. ? ?Remote monitoring is used to monitor your Pacemaker from home. This monitoring reduces the number of office visits required to check your device to one time per year. It allows [place] to keep an eye on the functioning of your device to ensure it is working properly. You are scheduled for a device check from home on 12/07/21. You may send your transmission at any time that day. If you have a wireless device, the transmission will be sent automatically. After your physician reviews your transmission, you will  receive a postcard with your next transmission date. ? ?We recommend signing up for the patient portal called "MyChart".  Sign up information is provided on this After Visit Summary.  MyChart is used to connect with patients for Virtual Visits (Telemedicine).  Patients are able to view lab/test results, encounter notes, upcoming appointments, etc.  Non-urgent messages can be sent to your provider as well.   ?To learn more about what you can do with MyChart, go to [email or web address].   ? ?Any Other Special Instructions Will Be Listed Below (If Applicable). ? ? ? ?

## 2021-11-07 ENCOUNTER — Encounter: Payer: Self-pay | Admitting: Family

## 2021-11-08 NOTE — Telephone Encounter (Signed)
Patient advised per Lenna Sciara she can be referred to [facility] which is part of [facility]. Patient will let us know, she will "keep looking to see if there is anyone specifically she will like to see.  ?

## 2021-11-11 ENCOUNTER — Telehealth: Payer: Self-pay | Admitting: Family

## 2021-11-11 DIAGNOSIS — H52223 Regular astigmatism, bilateral: Secondary | ICD-10-CM | POA: Diagnosis not present

## 2021-11-11 NOTE — Telephone Encounter (Signed)
I called patient to schedule her Annual Wellness Visit. Patient said she has moved to [place].  She really likes [name] and hates to transfer to another PCP, but she'd like to see someone in [place]. Patient wants to know if Lenna Sciara could recommend a good doctor in [place]? ?

## 2021-11-11 NOTE — Telephone Encounter (Signed)
Patient advised of provider's advise.  ?

## 2021-11-11 NOTE — Telephone Encounter (Signed)
I would recommend Kathrin Penner NP at [facility].  ?

## 2021-11-15 DIAGNOSIS — T63451D Toxic effect of venom of hornets, accidental (unintentional), subsequent encounter: Secondary | ICD-10-CM | POA: Diagnosis not present

## 2021-11-21 ENCOUNTER — Telehealth (HOSPITAL_COMMUNITY): Payer: Self-pay | Admitting: Emergency Medicine

## 2021-11-21 NOTE — Telephone Encounter (Signed)
Reaching out to patient to offer assistance regarding upcoming cardiac imaging study; pt verbalizes understanding of appt date/time, parking situation and where to check in, pre-test NPO status and medications ordered, and verified current allergies; name and call back number provided for further questions should they arise ?Marchia Bond RN Navigator Cardiac Imaging ?[facility] ?[phone] office ?[phone] cell ? ?Denies iv issues ?[facility] ?Arrival 730 ?

## 2021-11-22 ENCOUNTER — Encounter (HOSPITAL_COMMUNITY)
Admission: RE | Admit: 2021-11-22 | Discharge: 2021-11-22 | Disposition: A | Discharge status: Home / Self Care | Payer: Medicare PPO | Source: Ambulatory Visit | Attending: Cardiology | Admitting: Cardiology

## 2021-11-22 DIAGNOSIS — R079 Chest pain, unspecified: Secondary | ICD-10-CM | POA: Diagnosis not present

## 2021-11-22 DIAGNOSIS — Z950 Presence of cardiac pacemaker: Secondary | ICD-10-CM | POA: Insufficient documentation

## 2021-11-22 DIAGNOSIS — I441 Atrioventricular block, second degree: Secondary | ICD-10-CM | POA: Insufficient documentation

## 2021-11-22 DIAGNOSIS — R0609 Other forms of dyspnea: Secondary | ICD-10-CM | POA: Insufficient documentation

## 2021-11-22 DIAGNOSIS — I2510 Atherosclerotic heart disease of native coronary artery without angina pectoris: Secondary | ICD-10-CM | POA: Insufficient documentation

## 2021-11-22 MED ORDER — RUBIDIUM RB82 GENERATOR (RUBYFILL)
25.0000 | PACK | Freq: Once | INTRAVENOUS | Status: AC
  Administered 2021-11-22: 25 via INTRAVENOUS

## 2021-11-22 MED ORDER — REGADENOSON 0.4 MG/5ML IV SOLN
INTRAVENOUS | Status: AC
  Filled 2021-11-22: qty 5

## 2021-11-23 LAB — NM PET CT CARDIAC PERFUSION MULTI W/ABSOLUTE BLOODFLOW
LV dias vol: 88 mL (ref 46–106)
MBFR: 1.77
Nuc Rest EF: 56 %
Nuc Stress EF: 61 %
Peak HR: 77 {beats}/min
Rest HR: 65 {beats}/min
Rest MBF: 0.81 ml/g/min
Rest Nuclear Isotope Dose: 19.4 mCi
Rest perfusion cavity size (mL): 88 mL
ST Depression (mm): 0 mm
Stress MBF: 1.43 ml/g/min
Stress Nuclear Isotope Dose: 19.4 mCi
Stress perfusion cavity size (mL): 90 mL
TID: 0.91

## 2021-12-02 ENCOUNTER — Encounter: Payer: Medicare PPO | Admitting: Family

## 2021-12-04 NOTE — Progress Notes (Signed)
Cardiology Office Note   Date:  12/05/2021   ID:  AARIKA MOON, DOB Sep 12, 1945, MRN [id number]  PCP:  [name], NP  Cardiologist:   None   Chief Complaint  Patient presents with   Shortness of Breath      History of Present Illness: Madeline Schaefer is a 76 y.o. female who presents for follow up of CAD. I have not seen her since 2019.   He has a mildly reduced EF and moderate AI.   She has a distant history of stenting to her LAD. In  2011 she had an EF was 40 - 45%.  She had mild AI.  Her EF in June of this 2022 was 60%  with moderate AI.    She has a pacemaker and recently saw Dr. Quentin Ore.  She was having some dypsnea.    She had a PET scan.  This demonstrated a small fixed defect with no ischemia .  The EF was 61%.    She said she been getting a little short of breath walking up an incline but this is not getting any worse.  The patient denies any new symptoms such as chest discomfort, neck or arm discomfort. There has been no new PND or orthopnea. There have been no reported palpitations, presyncope or syncope.    Past Medical History:  Diagnosis Date   Blood transfusion without reported diagnosis    BRONCHITIS    CAD (coronary artery disease)    LAD stent 2003 ([facility]).     Cataract    DYSPNEA    HLD (hyperlipidemia)    Hypothyroidism    Mobitz type 2 second degree heart block    s/p PPM by Dr Olevia Perches 2004   Osteopenia    PACEMAKER, PERMANENT     Past Surgical History:  Procedure Laterality Date   CARDIAC CATHETERIZATION     CYSTOSCOPY WITH RETROGRADE PYELOGRAM, URETEROSCOPY AND STENT PLACEMENT  08/19/2012   Procedure: CYSTOSCOPY WITH RETROGRADE PYELOGRAM, URETEROSCOPY AND STENT PLACEMENT;  Surgeon: Molli Hazard, MD;  Location: [facility];  Service: Urology;  Laterality: Right;   MYOMECTOMY     PACEMAKER GENERATOR CHANGE N/A 12/22/2011   Procedure: PACEMAKER GENERATOR CHANGE;  Surgeon: Thompson Grayer, MD;  Location[facility] C[facility] LAB;   Service: Cardiovascular;  Laterality: N/A;   PACEMAKER INSERTION  2004, 12/22/11   initial implant by Dr Olevia Perches, Generator change (MDT Adapta L) by Dr Rayann Heman 6/13   TOTAL ABDOMINAL HYSTERECTOMY W/ BILATERAL SALPINGOOPHORECTOMY       Current Outpatient Medications  Medication Sig Dispense Refill   aspirin EC 81 MG tablet Take 81 mg by mouth daily.     atorvastatin (LIPITOR) 80 MG tablet Take 1 tablet (80 mg total) by mouth daily. 90 tablet 1   carvedilol (COREG) 3.125 MG tablet TAKE 1 TABLET BY MOUTH TWICE DAILY WITH A MEAL 180 tablet 0   clobetasol ointment (TEMOVATE) 3.57 % Apply 1 application topically daily as needed (eczema).      clotrimazole-betamethasone (LOTRISONE) cream Apply 1 application topically 2 (two) times daily. 30 g 0   EPINEPHrine 0.3 mg/0.3 mL IJ SOAJ injection Inject 0.3 mLs (0.3 mg total) into the muscle once. 2 Device 1   escitalopram (LEXAPRO) 10 MG tablet Take 1 tablet (10 mg total) by mouth daily. 90 tablet 1   fexofenadine (ALLEGRA) 180 MG tablet 1 tablet Swallow whole with water; do not take with fruit juices.     ibuprofen (ADVIL) 200 MG  tablet Take 2 tablets (400 mg total) by mouth every 8 (eight) hours as needed for moderate pain (with food). 30 tablet 0   levothyroxine (SYNTHROID) 88 MCG tablet Take 1 tablet (88 mcg total) by mouth daily. 90 tablet 0   losartan (COZAAR) 25 MG tablet Take 1 tablet (25 mg total) by mouth daily. (Patient taking differently: Take 12.5 mg by mouth daily. Patient takes 1/2) 90 tablet 1   Current Facility-Administered Medications  Medication Dose Route Frequency Provider Last Rate Last Admin   triamcinolone acetonide (KENALOG) 10 MG/ML injection 10 mg  10 mg Other Once Landis Martins, DPM        Allergies:   Bee venom, Codeine, Pneumovax 23 [pneumococcal vac polyvalent], Lisinopril, Hornet venom, and Sulfonamide derivatives    ROS:  Please see the history of present illness.   Otherwise, review of systems are positive for none.    All other systems are reviewed and negative.    PHYSICAL EXAM: VS:  BP 136/70   Pulse 60   Ht '5\' 10"'$  (1.778 m)   Wt 166 lb (75.3 kg)   LMP 10/17/1986   SpO2 98%   BMI 23.82 kg/m  , BMI Body mass index is 23.82 kg/m. GENERAL:  Well appearing NECK:  No jugular venous distention, waveform within normal limits, carotid upstroke brisk and symmetric, no bruits, no thyromegaly LUNGS:  Clear to auscultation bilaterally CHEST: Well-healed pacemaker pocket HEART:  PMI not displaced or sustained,S1 and S2 within normal limits, no S3, no S4, no clicks, no rubs, 2 out of 6 brief apical systolic murmur radiating slightly at the aortic outflow tract, 2 out of 6 diastolic murmurs ABD:  Flat, positive bowel sounds normal in frequency in pitch, no bruits, no rebound, no guarding, no midline pulsatile mass, no hepatomegaly, no splenomegaly EXT:  2 plus pulses throughout, no edema, no cyanosis no clubbing, varicose veins   EKG:  EKG is  ordered today. The ekg ordered AV paced rhythm, rate 60.  Demonstrates Atrioventricular demand pacing   Recent Labs: 05/27/2021: ALT 18; BUN 24; Creat 0.73; Potassium 4.1; Sodium 137; TSH 0.49    Lipid Panel    Component Value Date/Time   CHOL 137 05/27/2021 1535   CHOL 128 01/12/2017 1544   TRIG 97 05/27/2021 1535   HDL 46 (L) 05/27/2021 1535   HDL 51 01/12/2017 1544   CHOLHDL 3.0 05/27/2021 1535   VLDL 19.8 07/16/2019 1446   LDLCALC 73 05/27/2021 1535      Wt Readings from Last 3 Encounters:  12/05/21 166 lb (75.3 kg)  11/02/21 165 lb (74.8 kg)  05/27/21 170 lb (77.1 kg)      Other studies Reviewed: Additional studies/ records that were reviewed today include: EP notes, labs and PET scan report Review of the above records demonstrates: See elsewhere   ASSESSMENT AND PLAN:    CAD:    She had a recent negative PET.  However, I told her that if she has any increasing symptoms to let me know and we will do further evaluation.  STATUS POST  PACEMAKER:    She is up-to-date with follow-up and I did review the EP note as above.    HTN:      Blood pressure is at target.  No change in therapy.   DYSLIPIDEMIA:    Her LDL is 73 in November with an HDL of 46.  No change in therapy.   REDUCED EF:   EF was  NL above.  No change  in therapy   AI/MR:  There was moderate AI and mild MR in June last year.  I will likely follow this up next year with repeat echocardiography    I will follow this up as above.   Current medicines are reviewed at length with the patient today.  The patient has no concerns regarding medicines.  The following changes have been made:  no change  Labs/ tests ordered today include:   Orders Placed This Encounter  Procedures   EKG 12-Lead     Disposition:   FU with me in one year.     Signed, Minus Breeding, MD  12/05/2021 10:27 AM    [facility]

## 2021-12-05 ENCOUNTER — Encounter: Payer: Self-pay | Admitting: Cardiology

## 2021-12-05 ENCOUNTER — Ambulatory Visit: Payer: Medicare PPO | Admitting: Cardiology

## 2021-12-05 VITALS — BP 136/70 | HR 60 | Ht 70.0 in | Wt 166.0 lb

## 2021-12-05 DIAGNOSIS — I10 Essential (primary) hypertension: Secondary | ICD-10-CM | POA: Diagnosis not present

## 2021-12-05 DIAGNOSIS — I351 Nonrheumatic aortic (valve) insufficiency: Secondary | ICD-10-CM

## 2021-12-05 DIAGNOSIS — I25119 Atherosclerotic heart disease of native coronary artery with unspecified angina pectoris: Secondary | ICD-10-CM | POA: Diagnosis not present

## 2021-12-05 DIAGNOSIS — E785 Hyperlipidemia, unspecified: Secondary | ICD-10-CM

## 2021-12-05 NOTE — Patient Instructions (Signed)
Medication Instructions:  Your physician recommends that you continue on your current medications as directed. Please refer to the Current Medication list given to you today.  *If you need a refill on your cardiac medications before your next appointment, please call your pharmacy*  Follow-Up: At [facility], you and your health needs are our priority.  As part of our continuing mission to provide you with exceptional heart care, we have created designated Provider Care Teams.  These Care Teams include your primary Cardiologist (physician) and Advanced Practice Providers (APPs -  Physician Assistants and Nurse Practitioners) who all work together to provide you with the care you need, when you need it.  We recommend signing up for the patient portal called "MyChart".  Sign up information is provided on this After Visit Summary.  MyChart is used to connect with patients for Virtual Visits (Telemedicine).  Patients are able to view lab/test results, encounter notes, upcoming appointments, etc.  Non-urgent messages can be sent to your provider as well.   To learn more about what you can do with MyChart, go to [email or web address].    Your next appointment:   12 month(s)  The format for your next appointment:   In Person  Provider:   Dr. Percival Spanish  Important Information About Sugar

## 2021-12-07 ENCOUNTER — Encounter: Payer: Medicare PPO | Admitting: Cardiology

## 2021-12-07 ENCOUNTER — Ambulatory Visit (INDEPENDENT_AMBULATORY_CARE_PROVIDER_SITE_OTHER): Payer: Medicare PPO

## 2021-12-07 DIAGNOSIS — I441 Atrioventricular block, second degree: Secondary | ICD-10-CM | POA: Diagnosis not present

## 2021-12-07 NOTE — Progress Notes (Unsigned)
Electrophysiology Office Follow up Visit Note:    Date:  12/07/2021   ID:  Madeline Schaefer, DOB 13-Jun-1946, MRN [id number]  PCP:  [name], NP  [facility] Cardiologist:  None  [facility] Electrophysiologist:  Vickie Epley, MD    Interval History:    Madeline Schaefer is a 76 y.o. female who presents for a follow up visit. They were last seen in clinic ***. Since their last appointment, ***       Past Medical History:  Diagnosis Date   Blood transfusion without reported diagnosis    BRONCHITIS    CAD (coronary artery disease)    LAD stent 2003 ([facility]).     Cataract    DYSPNEA    HLD (hyperlipidemia)    Hypothyroidism    Mobitz type 2 second degree heart block    s/p PPM by Dr Olevia Perches 2004   Osteopenia    PACEMAKER, PERMANENT     Past Surgical History:  Procedure Laterality Date   CARDIAC CATHETERIZATION     CYSTOSCOPY WITH RETROGRADE PYELOGRAM, URETEROSCOPY AND STENT PLACEMENT  08/19/2012   Procedure: CYSTOSCOPY WITH RETROGRADE PYELOGRAM, URETEROSCOPY AND STENT PLACEMENT;  Surgeon: Molli Hazard, MD;  Location: [facility];  Service: Urology;  Laterality: Right;   MYOMECTOMY     PACEMAKER GENERATOR CHANGE N/A 12/22/2011   Procedure: PACEMAKER GENERATOR CHANGE;  Surgeon: Thompson Grayer, MD;  Location: [facility] CATH LAB;  Service: Cardiovascular;  Laterality: N/A;   PACEMAKER INSERTION  2004, 12/22/11   initial implant by Dr Olevia Perches, Generator change (MDT Adapta L) by Dr Rayann Heman 6/13   TOTAL ABDOMINAL HYSTERECTOMY W/ BILATERAL SALPINGOOPHORECTOMY      Current Medications: No outpatient medications have been marked as taking for the 12/07/21 encounter (Appointment) with Vickie Epley, MD.   Current Facility-Administered Medications for the 12/07/21 encounter (Appointment) with Vickie Epley, MD  Medication   triamcinolone acetonide (KENALOG) 10 MG/ML injection 10 mg     Allergies:   Bee venom, Codeine, Pneumovax 23  [pneumococcal vac polyvalent], Lisinopril, Hornet venom, and Sulfonamide derivatives   Social History   Socioeconomic History   Marital status: Married    Spouse name: Not on file   Number of children: 0   Years of education: Not on file   Highest education level: Not on file  Occupational History   Occupation: retired  Tobacco Use   Smoking status: Never   Smokeless tobacco: Never  Vaping Use   Vaping Use: Never used  Substance and Sexual Activity   Alcohol use: Yes    Comment: 1-2 drink daily   Drug use: No   Sexual activity: Yes  Other Topics Concern   Not on file  Social History Narrative   Retired Furniture conservator/restorer   Enjoys RV, bikes   No children   No pets   Social Determinants of Radio broadcast assistant Strain: Not on file  Food Insecurity: Not on file  Transportation Needs: Not on file  Physical Activity: Not on file  Stress: Not on file  Social Connections: Not on file     Family History: The patient's family history includes Breast cancer in her mother; Heart attack in her mother; Prostate cancer in her father. There is no history of Colon cancer, Esophageal cancer, or Rectal cancer.  ROS:   Please see the history of present illness.    All other systems reviewed and are negative.  EKGs/Labs/Other Studies Reviewed:    The following  studies were reviewed today: ***  EKG:  The ekg ordered today demonstrates ***  Recent Labs: 05/27/2021: ALT 18; BUN 24; Creat 0.73; Potassium 4.1; Sodium 137; TSH 0.49  Recent Lipid Panel    Component Value Date/Time   CHOL 137 05/27/2021 1535   CHOL 128 01/12/2017 1544   TRIG 97 05/27/2021 1535   HDL 46 (L) 05/27/2021 1535   HDL 51 01/12/2017 1544   CHOLHDL 3.0 05/27/2021 1535   VLDL 19.8 07/16/2019 1446   LDLCALC 73 05/27/2021 1535    Physical Exam:    VS:  LMP 10/17/1986     Wt Readings from Last 3 Encounters:  12/05/21 166 lb (75.3 kg)  11/02/21 165 lb (74.8 kg)  05/27/21 170 lb (77.1 kg)      GEN: *** Well nourished, well developed in no acute distress HEENT: Normal NECK: No JVD; No carotid bruits LYMPHATICS: No lymphadenopathy CARDIAC: ***RRR, no murmurs, rubs, gallops RESPIRATORY:  Clear to auscultation without rales, wheezing or rhonchi  ABDOMEN: Soft, non-tender, non-distended MUSCULOSKELETAL:  No edema; No deformity  SKIN: Warm and dry NEUROLOGIC:  Alert and oriented x 3 PSYCHIATRIC:  Normal affect        ASSESSMENT:    No diagnosis found. PLAN:    In order of problems listed above:           Total time spent with patient today *** minutes. This includes reviewing records, evaluating the patient and coordinating care.   Medication Adjustments/Labs and Tests Ordered: Current medicines are reviewed at length with the patient today.  Concerns regarding medicines are outlined above.  No orders of the defined types were placed in this encounter.  No orders of the defined types were placed in this encounter.    Signed, Lars Mage, MD, [facility], [facility] 12/07/2021 10:09 AM    Electrophysiology [facility]

## 2021-12-08 LAB — CUP PACEART REMOTE DEVICE CHECK
Battery Impedance: 1890 Ohm
Battery Remaining Longevity: 37 mo
Battery Voltage: 2.75 V
Brady Statistic AP VP Percent: 31 %
Brady Statistic AP VS Percent: 0 %
Brady Statistic AS VP Percent: 69 %
Brady Statistic AS VS Percent: 0 %
Date Time Interrogation Session: 20230524121003
Implantable Lead Implant Date: 20040907
Implantable Lead Implant Date: 20040907
Implantable Lead Location: 753859
Implantable Lead Location: 753860
Implantable Lead Model: 5076
Implantable Lead Model: 5076
Implantable Pulse Generator Implant Date: 20130607
Lead Channel Impedance Value: 429 Ohm
Lead Channel Impedance Value: 694 Ohm
Lead Channel Pacing Threshold Amplitude: 0.625 V
Lead Channel Pacing Threshold Amplitude: 0.75 V
Lead Channel Pacing Threshold Pulse Width: 0.4 ms
Lead Channel Pacing Threshold Pulse Width: 0.4 ms
Lead Channel Setting Pacing Amplitude: 2 V
Lead Channel Setting Pacing Amplitude: 2.5 V
Lead Channel Setting Pacing Pulse Width: 0.4 ms
Lead Channel Setting Sensing Sensitivity: 2 mV

## 2021-12-16 DIAGNOSIS — T63451D Toxic effect of venom of hornets, accidental (unintentional), subsequent encounter: Secondary | ICD-10-CM | POA: Diagnosis not present

## 2021-12-16 DIAGNOSIS — T63461D Toxic effect of venom of wasps, accidental (unintentional), subsequent encounter: Secondary | ICD-10-CM | POA: Diagnosis not present

## 2021-12-19 ENCOUNTER — Other Ambulatory Visit: Payer: Self-pay | Admitting: Cardiology

## 2021-12-19 ENCOUNTER — Other Ambulatory Visit: Payer: Self-pay | Admitting: Family

## 2021-12-19 DIAGNOSIS — I255 Ischemic cardiomyopathy: Secondary | ICD-10-CM

## 2021-12-20 ENCOUNTER — Encounter: Payer: Self-pay | Admitting: Family

## 2021-12-20 ENCOUNTER — Ambulatory Visit: Payer: Medicare PPO | Admitting: Family

## 2021-12-20 VITALS — BP 142/76 | HR 67 | Temp 98.4°F | Ht 70.0 in | Wt 167.6 lb

## 2021-12-20 DIAGNOSIS — I10 Essential (primary) hypertension: Secondary | ICD-10-CM

## 2021-12-20 DIAGNOSIS — I25119 Atherosclerotic heart disease of native coronary artery with unspecified angina pectoris: Secondary | ICD-10-CM | POA: Diagnosis not present

## 2021-12-20 DIAGNOSIS — Z1231 Encounter for screening mammogram for malignant neoplasm of breast: Secondary | ICD-10-CM | POA: Diagnosis not present

## 2021-12-20 NOTE — Assessment & Plan Note (Signed)
Slightly elevated today.  Discussed with patient the importance of keeping blood pressure log at home and to continue to spot check blood pressure.  Encouraged low-sodium diet.  As blood pressure has previously been 136/70, we jointly agreed to defer making medication changes at this time.  We will likely increase losartan 25 mg if needed.  Patient will return in 1 month with blood pressure readings and also bring blood pressure machine with her.  Continue losartan 12.5 mg, Coreg 3.125 mg twice daily.

## 2021-12-20 NOTE — Patient Instructions (Signed)
Blood pressure slightly elevated today. Monitor blood pressure at home and me 5-6 reading on separate days over a couple of weeks. Goal is less than 120/80, based on newest guidelines, however we certainly want to be less than 130/80.  Please bring blood pressure machine and log with you to follow-up appointment.  Please call  and schedule your 3D mammogram and /or bone density scan as we discussed.   [facility]  ( new location in 2023)  [address], [place], [place] [address]  [place], [place]   Nice to meet you!

## 2021-12-20 NOTE — Progress Notes (Signed)
Subjective:    Patient ID: Madeline Schaefer, female    DOB: [birth year], 76 y.o.   MRN: [id number]  CC: Madeline Schaefer is a 76 y.o. female who presents today to establish care to transfer of care from [facility]   HPI: She feels well today No new complaints She has a puppy and has been walking.  No chest pain or shortness of breath    Coronary atherosclerosis-compliant with Lipitor 80 mg Hypothyroidism-compliant with levothyroxine 88 mcg Hypertension-compliant with losartan 12.5 mg, Coreg 3.125 mg twice daily. She has BP machine at home. Blood pressure is usually higher in the doctor's office.   She follows with cardiology, Dr Percival Spanish; last seen  12/05/21 for CAD, lipidemia, reduced ejection fraction, AI/MR.  She has a pacemaker. Compliant with asa '81mg'$   HISTORY:  Past Medical History:  Diagnosis Date   Blood transfusion without reported diagnosis    BRONCHITIS    CAD (coronary artery disease)    LAD stent 2003 ([facility]).     Cataract    DYSPNEA    HLD (hyperlipidemia)    Hypothyroidism    Mobitz type 2 second degree heart block    s/p PPM by Dr Olevia Perches 2004   Osteopenia    PACEMAKER, PERMANENT    Past Surgical History:  Procedure Laterality Date   CARDIAC CATHETERIZATION     CYSTOSCOPY WITH RETROGRADE PYELOGRAM, URETEROSCOPY AND STENT PLACEMENT  08/19/2012   Procedure: CYSTOSCOPY WITH RETROGRADE PYELOGRAM, URETEROSCOPY AND STENT PLACEMENT;  Surgeon: Molli Hazard, MD;  Location: [facility];  Service: Urology;  Laterality: Right;   MYOMECTOMY     PACEMAKER GENERATOR CHANGE N/A 12/22/2011   Procedure: PACEMAKER GENERATOR CHANGE;  Surgeon: Thompson Grayer, MD;  Location: [facility] CATH LAB;  Service: Cardiovascular;  Laterality: N/A;   PACEMAKER INSERTION  2004, 12/22/11   initial implant by Dr Olevia Perches, Generator change (MDT Adapta L) by Dr Rayann Heman 6/13   TOTAL ABDOMINAL HYSTERECTOMY W/ BILATERAL SALPINGOOPHORECTOMY     Family History  Problem Relation Age  of Onset   Heart attack Mother        died at age 45   Breast cancer Mother        age 55, had mastectomy.    Prostate cancer Father    Colon cancer Neg Hx    Esophageal cancer Neg Hx    Rectal cancer Neg Hx     Allergies: Bee venom, Codeine, Pneumovax 23 [pneumococcal vac polyvalent], Lisinopril, Hornet venom, Sulfa antibiotics, and Sulfonamide derivatives Current Outpatient Medications on File Prior to Visit  Medication Sig Dispense Refill   aspirin EC 81 MG tablet Take 81 mg by mouth daily.     atorvastatin (LIPITOR) 80 MG tablet Take 1 tablet by mouth once daily 90 tablet 0   carvedilol (COREG) 3.125 MG tablet TAKE 1 TABLET BY MOUTH TWICE DAILY WITH A MEAL 180 tablet 0   clobetasol ointment (TEMOVATE) 8.52 % Apply 1 application topically daily as needed (eczema).      clotrimazole-betamethasone (LOTRISONE) cream Apply 1 application topically 2 (two) times daily. 30 g 0   EPINEPHrine 0.3 mg/0.3 mL IJ SOAJ injection Inject 0.3 mLs (0.3 mg total) into the muscle once. 2 Device 1   escitalopram (LEXAPRO) 10 MG tablet Take 1 tablet (10 mg total) by mouth daily. 90 tablet 1   fexofenadine (ALLEGRA) 180 MG tablet 1 tablet Swallow whole with water; do not take with fruit juices.     ibuprofen (ADVIL) 200 MG tablet  Take 2 tablets (400 mg total) by mouth every 8 (eight) hours as needed for moderate pain (with food). 30 tablet 0   levothyroxine (SYNTHROID) 88 MCG tablet Take 1 tablet by mouth once daily 90 tablet 0   losartan (COZAAR) 25 MG tablet Take 1 tablet (25 mg total) by mouth daily. (Patient taking differently: Take 12.5 mg by mouth daily. Patient takes 1/2) 90 tablet 1   Current Facility-Administered Medications on File Prior to Visit  Medication Dose Route Frequency Provider Last Rate Last Admin   triamcinolone acetonide (KENALOG) 10 MG/ML injection 10 mg  10 mg Other Once Landis Martins, DPM        Social History   Tobacco Use   Smoking status: Never   Smokeless tobacco: Never   Vaping Use   Vaping Use: Never used  Substance Use Topics   Alcohol use: Yes    Comment: 1-2 drink daily   Drug use: No    Review of Systems  Constitutional:  Negative for chills and fever.  Respiratory:  Negative for cough.   Cardiovascular:  Negative for chest pain and palpitations.  Gastrointestinal:  Negative for nausea and vomiting.     Objective:    BP (!) 142/76 (BP Location: Left Arm, Patient Position: Sitting, Cuff Size: Normal)   Pulse 67   Temp 98.4 F (36.9 C) (Oral)   Ht '5\' 10"'$  (1.778 m)   Wt 167 lb 9.6 oz (76 kg)   LMP 10/17/1986   SpO2 98%   BMI 24.05 kg/m  BP Readings from Last 3 Encounters:  12/20/21 (!) 142/76  12/05/21 136/70  11/02/21 134/74   Wt Readings from Last 3 Encounters:  12/20/21 167 lb 9.6 oz (76 kg)  12/05/21 166 lb (75.3 kg)  11/02/21 165 lb (74.8 kg)    Physical Exam Vitals reviewed.  Constitutional:      Appearance: She is well-developed.  Eyes:     Conjunctiva/sclera: Conjunctivae normal.  Cardiovascular:     Rate and Rhythm: Normal rate and regular rhythm.     Pulses: Normal pulses.     Heart sounds: Normal heart sounds.  Pulmonary:     Effort: Pulmonary effort is normal.     Breath sounds: Normal breath sounds. No wheezing, rhonchi or rales.  Skin:    General: Skin is warm and dry.  Neurological:     Mental Status: She is alert.  Psychiatric:        Speech: Speech normal.        Behavior: Behavior normal.        Thought Content: Thought content normal.       Assessment & Plan:   Problem List Items Addressed This Visit       Cardiovascular and Mediastinum   Coronary atherosclerosis    Chronic, symptomatically stable.  Continue Lipitor 80 mg, aspirin 81 mg.  She will continue to follow with Dr Alba Cory.   Lab Results  Component Value Date   LDLCALC 73 05/27/2021          Essential hypertension - Primary    Slightly elevated today.  Discussed with patient the importance of keeping blood pressure log at  home and to continue to spot check blood pressure.  Encouraged low-sodium diet.  As blood pressure has previously been 136/70, we jointly agreed to defer making medication changes at this time.  We will likely increase losartan 25 mg if needed.  Patient will return in 1 month with blood pressure readings and also bring blood  pressure machine with her.  Continue losartan 12.5 mg, Coreg 3.125 mg twice daily.       Other Visit Diagnoses     Encounter for screening mammogram for malignant neoplasm of breast       Relevant Orders   MM 3D SCREEN BREAST BILATERAL        I am having Willa Rough W. Muto maintain her aspirin EC, clobetasol ointment, EPINEPHrine, ibuprofen, losartan, clotrimazole-betamethasone, carvedilol, escitalopram, fexofenadine, atorvastatin, and levothyroxine. We will continue to administer triamcinolone acetonide.   No orders of the defined types were placed in this encounter.   Return precautions given.   Risks, benefits, and alternatives of the medications and treatment plan prescribed today were discussed, and patient expressed understanding.   Education regarding symptom management and diagnosis given to patient on AVS.  Continue to follow with Burnard Hawthorne, FNP for routine health maintenance.   Madeline Schaefer and I agreed with plan.   Mable Paris, FNP

## 2021-12-20 NOTE — Assessment & Plan Note (Signed)
Chronic, symptomatically stable.  Continue Lipitor 80 mg, aspirin 81 mg.  She will continue to follow with Dr Alba Cory.   Lab Results  Component Value Date   LDLCALC 73 05/27/2021

## 2021-12-20 NOTE — Progress Notes (Signed)
Remote pacemaker transmission.   

## 2022-01-19 DIAGNOSIS — T63451D Toxic effect of venom of hornets, accidental (unintentional), subsequent encounter: Secondary | ICD-10-CM | POA: Diagnosis not present

## 2022-01-19 DIAGNOSIS — T63461D Toxic effect of venom of wasps, accidental (unintentional), subsequent encounter: Secondary | ICD-10-CM | POA: Diagnosis not present

## 2022-01-24 ENCOUNTER — Other Ambulatory Visit: Payer: Self-pay | Admitting: Family

## 2022-02-03 ENCOUNTER — Telehealth: Payer: Self-pay | Admitting: Family

## 2022-02-03 NOTE — Telephone Encounter (Signed)
Called patient and was able to get patient scheduled for 02/22/22

## 2022-02-03 NOTE — Telephone Encounter (Signed)
Patient called to r/s her 08/07 appt due to schedule conflict w/ husbands appt.   She wanted to know the following: If there were any other appts earlier or later that day-  no available appts. If there were any other appointments available that week - no available appts.  If she could come in have someone check her BP and read her machine w/o having to see the provider.

## 2022-02-14 DIAGNOSIS — T63451D Toxic effect of venom of hornets, accidental (unintentional), subsequent encounter: Secondary | ICD-10-CM | POA: Diagnosis not present

## 2022-02-14 DIAGNOSIS — T63461D Toxic effect of venom of wasps, accidental (unintentional), subsequent encounter: Secondary | ICD-10-CM | POA: Diagnosis not present

## 2022-02-20 ENCOUNTER — Ambulatory Visit: Payer: Medicare PPO | Admitting: Family

## 2022-02-20 ENCOUNTER — Ambulatory Visit: Payer: Self-pay | Admitting: Licensed Clinical Social Worker

## 2022-02-20 NOTE — Patient Outreach (Signed)
  Care Coordination   Initial Visit Note   02/20/2022 Name: Madeline Schaefer MRN: [id number] DOB: [birth year]  Madeline Schaefer is a 76 y.o. year old female who sees Arnett, Yvetta Coder, FNP for primary care. I spoke with  Madeline Schaefer by phone today  What matters to the patients health and wellness today?  Patient stated she was not interested in Care coordination support    Goals Addressed    None. Patient said she was not interested in Care coordination services     SDOH assessments and interventions completed:  No     Care Coordination Interventions Activated:  No  Care Coordination Interventions:  No, not indicated   Follow up plan: No further intervention required.   Encounter Outcome:  Pt. Refused

## 2022-02-20 NOTE — Patient Instructions (Signed)
Visit Information  Thank you for taking time to visit with me today. Please don't hesitate to contact me if I can be of assistance to you before our next scheduled telephone appointment.  Following are the goals we discussed today:   Patient refused Care Coordination support. .She was not interested in Care coordination support  Please call the care guide team at [phone] if you need to cancel or reschedule your appointment.   If you are experiencing a Mental Health or [place] or need someone to talk to, please go to [facility] [place] [phone])   Following is a copy of your full plan of care:   No Care Plan. Patient not interested in Care coordination services  [name] was given information about Care Management services by the embedded care coordination team including:  Care Management services include personalized support from designated clinical staff supervised by her physician, including individualized plan of care and coordination with other care providers 24/7 contact phone numbers for assistance for urgent and routine care needs. The patient may stop CCM services at any time (effective at the end of the month) by phone call to the office staff.  Patient agreed to services and verbal consent obtained.   Follow Up: No follow up needed at present  Madeline Schaefer.Madeline Schaefer MSW, [facility] representative [facility] Care Management [phone]

## 2022-02-22 ENCOUNTER — Other Ambulatory Visit: Payer: Self-pay | Admitting: Family

## 2022-02-22 ENCOUNTER — Encounter: Payer: Self-pay | Admitting: Family

## 2022-02-22 ENCOUNTER — Ambulatory Visit: Payer: Medicare PPO | Admitting: Family

## 2022-02-22 VITALS — BP 142/70 | HR 63 | Temp 98.2°F | Ht 70.0 in | Wt 164.8 lb

## 2022-02-22 DIAGNOSIS — Z1231 Encounter for screening mammogram for malignant neoplasm of breast: Secondary | ICD-10-CM

## 2022-02-22 DIAGNOSIS — E039 Hypothyroidism, unspecified: Secondary | ICD-10-CM

## 2022-02-22 DIAGNOSIS — I10 Essential (primary) hypertension: Secondary | ICD-10-CM

## 2022-02-22 DIAGNOSIS — I25119 Atherosclerotic heart disease of native coronary artery with unspecified angina pectoris: Secondary | ICD-10-CM

## 2022-02-22 DIAGNOSIS — R7303 Prediabetes: Secondary | ICD-10-CM | POA: Diagnosis not present

## 2022-02-22 MED ORDER — LEVOTHYROXINE SODIUM 88 MCG PO TABS: 88.0000 ug | ORAL_TABLET | Freq: Every day | ORAL | 3 refills | Status: DC

## 2022-02-22 MED ORDER — LOSARTAN POTASSIUM 25 MG PO TABS: 25.0000 mg | ORAL_TABLET | Freq: Every day | ORAL | 1 refills | Status: DC

## 2022-02-22 NOTE — Patient Instructions (Addendum)
Continue low sodium diet and exercise  Please call  and schedule your 3D mammogram and /or bone density scan as we discussed.   [facility]  ( new location in 2023)  [address], [place], [place] [address]  [place], [place]

## 2022-02-22 NOTE — Progress Notes (Signed)
Subjective:    Patient ID: Madeline Schaefer, female    DOB: 06-29-1946, 76 y.o.   MRN: [id number]  CC: Madeline Schaefer is a 76 y.o. female who presents today for blood pressure follow up.   HPI: Feels well today.  No new complaints   Compliant with losartan 25 mg qam (increased from 12.5 mg) , carvedilol 3.125 mg twice daily.  Blood pressure at home has fluctuated between 124/68, 137/71 123/61 to 157/78, 139/68, 145/70/.  No chest pain or shortness of breath HISTORY:  Past Medical History:  Diagnosis Date   Blood transfusion without reported diagnosis    BRONCHITIS    CAD (coronary artery disease)    LAD stent 2003 ([facility]).     Cataract    DYSPNEA    HLD (hyperlipidemia)    Hypothyroidism    Mobitz type 2 second degree heart block    s/p PPM by Dr Madeline Schaefer 2004   Osteopenia    PACEMAKER, PERMANENT    Past Surgical History:  Procedure Laterality Date   CARDIAC CATHETERIZATION     CYSTOSCOPY WITH RETROGRADE PYELOGRAM, URETEROSCOPY AND STENT PLACEMENT  08/19/2012   Procedure: CYSTOSCOPY WITH RETROGRADE PYELOGRAM, URETEROSCOPY AND STENT PLACEMENT;  Surgeon: Madeline Hazard, MD;  Location: [facility];  Service: Urology;  Laterality: Right;   MYOMECTOMY     PACEMAKER GENERATOR CHANGE N/A 12/22/2011   Procedure: PACEMAKER GENERATOR CHANGE;  Surgeon: Madeline Grayer, MD;  Location: [facility] CATH LAB;  Service: Cardiovascular;  Laterality: N/A;   PACEMAKER INSERTION  2004, 12/22/11   initial implant by Dr Madeline Schaefer, Generator change (MDT Adapta L) by Dr Madeline Schaefer 6/13   TOTAL ABDOMINAL HYSTERECTOMY W/ BILATERAL SALPINGOOPHORECTOMY     Family History  Problem Relation Age of Onset   Heart attack Mother        died at age 37   Breast cancer Mother        age 64, had mastectomy.    Prostate cancer Father    Colon cancer Neg Hx    Esophageal cancer Neg Hx    Rectal cancer Neg Hx     Allergies: Bee venom, Codeine, Pneumovax 23 [pneumococcal vac polyvalent], Lisinopril, Hornet  venom, Sulfa antibiotics, and Sulfonamide derivatives Current Outpatient Medications on File Prior to Visit  Medication Sig Dispense Refill   aspirin EC 81 MG tablet Take 81 mg by mouth daily.     atorvastatin (LIPITOR) 80 MG tablet Take 1 tablet by mouth once daily 90 tablet 0   carvedilol (COREG) 3.125 MG tablet TAKE 1 TABLET BY MOUTH TWICE DAILY WITH A MEAL 180 tablet 2   clobetasol ointment (TEMOVATE) 6.64 % Apply 1 application topically daily as needed (eczema).      clotrimazole-betamethasone (LOTRISONE) cream Apply 1 application topically 2 (two) times daily. 30 g 0   EPINEPHrine 0.3 mg/0.3 mL IJ SOAJ injection Inject 0.3 mLs (0.3 mg total) into the muscle once. 2 Device 1   escitalopram (LEXAPRO) 10 MG tablet Take 1 tablet (10 mg total) by mouth daily. 90 tablet 1   fexofenadine (ALLEGRA) 180 MG tablet 1 tablet Swallow whole with water; do not take with fruit juices.     ibuprofen (ADVIL) 200 MG tablet Take 2 tablets (400 mg total) by mouth every 8 (eight) hours as needed for moderate pain (with food). 30 tablet 0   Current Facility-Administered Medications on File Prior to Visit  Medication Dose Route Frequency Provider Last Rate Last Admin   triamcinolone acetonide (KENALOG) 10  MG/ML injection 10 mg  10 mg Other Once Madeline Schaefer, DPM        Social History   Tobacco Use   Smoking status: Never   Smokeless tobacco: Never  Vaping Use   Vaping Use: Never used  Substance Use Topics   Alcohol use: Yes    Comment: 1-2 drink daily   Drug use: No    Review of Systems  Constitutional:  Negative for chills and fever.  Respiratory:  Negative for cough.   Cardiovascular:  Negative for chest pain and palpitations.  Gastrointestinal:  Negative for nausea and vomiting.      Objective:    BP (!) 142/70 (BP Location: Left Arm, Patient Position: Sitting, Cuff Size: Normal)   Pulse 63   Temp 98.2 F (36.8 C) (Oral)   Ht '5\' 10"'$  (1.778 m)   Wt 164 lb 12.8 oz (74.8 kg)   LMP  10/17/1986   SpO2 98%   BMI 23.65 kg/m  BP Readings from Last 3 Encounters:  02/22/22 (!) 142/70  12/20/21 (!) 142/76  12/05/21 136/70   Wt Readings from Last 3 Encounters:  02/22/22 164 lb 12.8 oz (74.8 kg)  12/20/21 167 lb 9.6 oz (76 kg)  12/05/21 166 lb (75.3 kg)    Physical Exam Vitals reviewed.  Constitutional:      Appearance: She is well-developed.  Eyes:     Conjunctiva/sclera: Conjunctivae normal.  Cardiovascular:     Rate and Rhythm: Normal rate and regular rhythm.     Pulses: Normal pulses.     Heart sounds: Normal heart sounds.  Pulmonary:     Effort: Pulmonary effort is normal.     Breath sounds: Normal breath sounds. No wheezing, rhonchi or rales.  Skin:    General: Skin is warm and dry.  Neurological:     Mental Status: She is alert.  Psychiatric:        Speech: Speech normal.        Behavior: Behavior normal.        Thought Content: Thought content normal.        Assessment & Plan:   Problem List Items Addressed This Visit       Cardiovascular and Mediastinum   Coronary atherosclerosis   Relevant Medications   losartan (COZAAR) 25 MG tablet   Other Relevant Orders   Lipid panel   Essential hypertension    Blood pressure remains labile after reviewing blood pressure at home.  Blood pressure machine checked our office and is accurate today.  Patient will remain on losartan 25 mg as she was previously on 12.5 mg daily.  She will continue carvedilol 3.125 mg twice daily.  Advised her blood pressure was greater than 140/80 she may take an additional 12.5 mg prn in addition to losartan 25 mg.  She will let me know how she is doing      Relevant Medications   losartan (COZAAR) 25 MG tablet   Other Relevant Orders   Comprehensive metabolic panel   CBC with Differential/Platelet     Endocrine   Hypothyroidism   Relevant Medications   levothyroxine (SYNTHROID) 88 MCG tablet   Other Relevant Orders   TSH   Other Visit Diagnoses      Prediabetes    -  Primary   Relevant Orders   Hemoglobin A1c        I have changed Madeline Schaefer's losartan and levothyroxine. I am also having her maintain her aspirin EC, clobetasol ointment, EPINEPHrine, ibuprofen,  clotrimazole-betamethasone, escitalopram, fexofenadine, atorvastatin, and carvedilol. We will continue to administer triamcinolone acetonide.   Meds ordered this encounter  Medications   losartan (COZAAR) 25 MG tablet    Sig: Take 1 tablet (25 mg total) by mouth daily. Take an additional 12.'5mg'$  if BP > 140/80    Dispense:  90 tablet    Refill:  1    Order Specific Question:   Supervising Provider    Answer:   Crecencio Mc [[id number]]   levothyroxine (SYNTHROID) 88 MCG tablet    Sig: Take 1 tablet (88 mcg total) by mouth daily.    Dispense:  90 tablet    Refill:  3    Order Specific Question:   Supervising Provider    Answer:   Crecencio Mc [[id number]]    Return precautions given.   Risks, benefits, and alternatives of the medications and treatment plan prescribed today were discussed, and patient expressed understanding.   Education regarding symptom management and diagnosis given to patient on AVS.  Continue to follow with Burnard Hawthorne, FNP for routine health maintenance.   Madeline Schaefer and I agreed with plan.   Mable Paris, FNP

## 2022-02-22 NOTE — Assessment & Plan Note (Signed)
Blood pressure remains labile after reviewing blood pressure at home.  Blood pressure machine checked our office and is accurate today.  Patient will remain on losartan 25 mg as she was previously on 12.5 mg daily.  She will continue carvedilol 3.125 mg twice daily.  Advised her blood pressure was greater than 140/80 she may take an additional 12.5 mg prn in addition to losartan 25 mg.  She will let me know how she is doing

## 2022-03-08 ENCOUNTER — Ambulatory Visit (INDEPENDENT_AMBULATORY_CARE_PROVIDER_SITE_OTHER): Payer: Medicare PPO

## 2022-03-08 DIAGNOSIS — I441 Atrioventricular block, second degree: Secondary | ICD-10-CM | POA: Diagnosis not present

## 2022-03-13 LAB — CUP PACEART REMOTE DEVICE CHECK
Battery Impedance: 1956 Ohm
Battery Remaining Longevity: 36 mo
Battery Voltage: 2.75 V
Brady Statistic AP VP Percent: 26 %
Brady Statistic AP VS Percent: 0 %
Brady Statistic AS VP Percent: 74 %
Brady Statistic AS VS Percent: 0 %
Date Time Interrogation Session: 20230827173025
Implantable Lead Implant Date: 20040907
Implantable Lead Implant Date: 20040907
Implantable Lead Location: 753859
Implantable Lead Location: 753860
Implantable Lead Model: 5076
Implantable Lead Model: 5076
Implantable Pulse Generator Implant Date: 20130607
Lead Channel Impedance Value: 469 Ohm
Lead Channel Impedance Value: 689 Ohm
Lead Channel Pacing Threshold Amplitude: 0.5 V
Lead Channel Pacing Threshold Amplitude: 0.75 V
Lead Channel Pacing Threshold Pulse Width: 0.4 ms
Lead Channel Pacing Threshold Pulse Width: 0.4 ms
Lead Channel Setting Pacing Amplitude: 2 V
Lead Channel Setting Pacing Amplitude: 2.5 V
Lead Channel Setting Pacing Pulse Width: 0.4 ms
Lead Channel Setting Sensing Sensitivity: 2 mV

## 2022-03-15 ENCOUNTER — Other Ambulatory Visit (INDEPENDENT_AMBULATORY_CARE_PROVIDER_SITE_OTHER): Payer: Medicare PPO

## 2022-03-15 ENCOUNTER — Telehealth: Payer: Self-pay | Admitting: Family

## 2022-03-15 DIAGNOSIS — I25119 Atherosclerotic heart disease of native coronary artery with unspecified angina pectoris: Secondary | ICD-10-CM | POA: Diagnosis not present

## 2022-03-15 DIAGNOSIS — E039 Hypothyroidism, unspecified: Secondary | ICD-10-CM | POA: Diagnosis not present

## 2022-03-15 DIAGNOSIS — I10 Essential (primary) hypertension: Secondary | ICD-10-CM

## 2022-03-15 DIAGNOSIS — R7303 Prediabetes: Secondary | ICD-10-CM | POA: Diagnosis not present

## 2022-03-15 LAB — CBC WITH DIFFERENTIAL/PLATELET
Basophils Absolute: 0 10*3/uL (ref 0.0–0.1)
Basophils Relative: 0.8 % (ref 0.0–3.0)
Eosinophils Absolute: 0.2 10*3/uL (ref 0.0–0.7)
Eosinophils Relative: 4.7 % (ref 0.0–5.0)
HCT: 38.9 % (ref 36.0–46.0)
Hemoglobin: 12.9 g/dL (ref 12.0–15.0)
Lymphocytes Relative: 29.2 % (ref 12.0–46.0)
Lymphs Abs: 1.2 10*3/uL (ref 0.7–4.0)
MCHC: 33.2 g/dL (ref 30.0–36.0)
MCV: 88.6 fl (ref 78.0–100.0)
Monocytes Absolute: 0.4 10*3/uL (ref 0.1–1.0)
Monocytes Relative: 9 % (ref 3.0–12.0)
Neutro Abs: 2.4 10*3/uL (ref 1.4–7.7)
Neutrophils Relative %: 56.3 % (ref 43.0–77.0)
Platelets: 197 10*3/uL (ref 150.0–400.0)
RBC: 4.39 Mil/uL (ref 3.87–5.11)
RDW: 15 % (ref 11.5–15.5)
WBC: 4.2 10*3/uL (ref 4.0–10.5)

## 2022-03-15 LAB — COMPREHENSIVE METABOLIC PANEL
ALT: 20 U/L (ref 0–35)
AST: 17 U/L (ref 0–37)
Albumin: 3.9 g/dL (ref 3.5–5.2)
Alkaline Phosphatase: 67 U/L (ref 39–117)
BUN: 20 mg/dL (ref 6–23)
CO2: 29 mEq/L (ref 19–32)
Calcium: 8.7 mg/dL (ref 8.4–10.5)
Chloride: 103 mEq/L (ref 96–112)
Creatinine, Ser: 0.69 mg/dL (ref 0.40–1.20)
GFR: 84.75 mL/min (ref >60.00)
Glucose, Bld: 93 mg/dL (ref 70–99)
Potassium: 4.4 mEq/L (ref 3.5–5.1)
Sodium: 139 mEq/L (ref 135–145)
Total Bilirubin: 0.9 mg/dL (ref 0.2–1.2)
Total Protein: 6.6 g/dL (ref 6.0–8.3)

## 2022-03-15 LAB — LIPID PANEL
Cholesterol: 132 mg/dL (ref 0–200)
HDL: 49.5 mg/dL (ref >39.00)
LDL Cholesterol: 68 mg/dL (ref 0–99)
NonHDL: 82.67
Total CHOL/HDL Ratio: 3
Triglycerides: 72 mg/dL (ref 0.0–149.0)
VLDL: 14.4 mg/dL (ref 0.0–40.0)

## 2022-03-15 LAB — HEMOGLOBIN A1C: Hgb A1c MFr Bld: 6.4 % (ref 4.6–6.5)

## 2022-03-15 LAB — TSH: TSH: 1.49 u[IU]/mL (ref 0.35–5.50)

## 2022-03-15 NOTE — Telephone Encounter (Signed)
Spoke with patient to sched AWV for her and spouse she stated to call back middle of Nov 2023

## 2022-03-16 DIAGNOSIS — T63461D Toxic effect of venom of wasps, accidental (unintentional), subsequent encounter: Secondary | ICD-10-CM | POA: Diagnosis not present

## 2022-03-16 DIAGNOSIS — T63441D Toxic effect of venom of bees, accidental (unintentional), subsequent encounter: Secondary | ICD-10-CM | POA: Diagnosis not present

## 2022-03-25 ENCOUNTER — Other Ambulatory Visit: Payer: Self-pay | Admitting: Family

## 2022-03-26 ENCOUNTER — Encounter: Payer: Self-pay | Admitting: Family

## 2022-03-27 ENCOUNTER — Other Ambulatory Visit: Payer: Self-pay

## 2022-03-27 DIAGNOSIS — I255 Ischemic cardiomyopathy: Secondary | ICD-10-CM

## 2022-03-27 MED ORDER — ATORVASTATIN CALCIUM 80 MG PO TABS: 80.0000 mg | ORAL_TABLET | Freq: Every day | ORAL | 4 refills | Status: DC

## 2022-03-27 MED ORDER — ESCITALOPRAM OXALATE 10 MG PO TABS: 10.0000 mg | ORAL_TABLET | Freq: Every day | ORAL | 1 refills | Status: DC

## 2022-03-27 MED ORDER — ATORVASTATIN CALCIUM 80 MG PO TABS: 80.0000 mg | ORAL_TABLET | Freq: Every day | ORAL | 0 refills | Status: DC

## 2022-03-27 MED ORDER — CARVEDILOL 3.125 MG PO TABS: 3.1250 mg | ORAL_TABLET | Freq: Two times a day (BID) | ORAL | 2 refills | Status: DC

## 2022-04-04 NOTE — Progress Notes (Signed)
Remote pacemaker transmission.   

## 2022-04-18 DIAGNOSIS — T63461D Toxic effect of venom of wasps, accidental (unintentional), subsequent encounter: Secondary | ICD-10-CM | POA: Diagnosis not present

## 2022-04-18 DIAGNOSIS — T63451D Toxic effect of venom of hornets, accidental (unintentional), subsequent encounter: Secondary | ICD-10-CM | POA: Diagnosis not present

## 2022-05-03 ENCOUNTER — Encounter: Payer: Self-pay | Admitting: Internal Medicine

## 2022-05-11 DIAGNOSIS — J301 Allergic rhinitis due to pollen: Secondary | ICD-10-CM | POA: Diagnosis not present

## 2022-05-11 DIAGNOSIS — T63451D Toxic effect of venom of hornets, accidental (unintentional), subsequent encounter: Secondary | ICD-10-CM | POA: Diagnosis not present

## 2022-05-11 DIAGNOSIS — H1045 Other chronic allergic conjunctivitis: Secondary | ICD-10-CM | POA: Diagnosis not present

## 2022-05-25 ENCOUNTER — Ambulatory Visit (INDEPENDENT_AMBULATORY_CARE_PROVIDER_SITE_OTHER): Payer: Medicare PPO

## 2022-05-25 VITALS — Ht 70.0 in | Wt 164.0 lb

## 2022-05-25 DIAGNOSIS — Z0000 Encounter for general adult medical examination without abnormal findings: Secondary | ICD-10-CM | POA: Diagnosis not present

## 2022-05-25 NOTE — Progress Notes (Signed)
Subjective:   Madeline Schaefer is a 76 y.o. female who presents for Medicare Annual (Subsequent) preventive examination.  Review of Systems    No ROS.  Medicare Wellness Virtual Visit.  Visual/audio telehealth visit, UTA vital signs.   See social history for additional risk factors.   Cardiac Risk Factors include: advanced age (>39mn, >>6women);hypertension     Objective:    Today's Vitals   05/25/22 0921  Weight: 164 lb (74.4 kg)  Height: '5\' 10"'$  (1.778 m)   Body mass index is 23.53 kg/m.     05/25/2022    9:08 AM 05/30/2018    9:28 AM 09/15/2016   10:54 AM 10/15/2013    1:17 PM 08/19/2012   12:06 PM  Advanced Directives  Does Patient Have a Medical Advance Directive? Yes Yes Yes Patient has advance directive, copy not in chart Patient has advance directive, copy not in chart  Type of Advance Directive [place] will [place]Living will H[place]Living will  [place]Living will  Does patient want to make changes to medical advance directive? No - Patient declined      Copy of [place] Chart? No - copy requested No - copy requested No - copy requested    Pre-existing out of facility DNR order (yellow form or pink MOST form)     No    Current Medications (verified) Outpatient Encounter Medications as of 05/25/2022  Medication Sig   aspirin EC 81 MG tablet Take 81 mg by mouth daily.   atorvastatin (LIPITOR) 80 MG tablet Take 1 tablet by mouth once daily   atorvastatin (LIPITOR) 80 MG tablet Take 1 tablet (80 mg total) by mouth daily.   carvedilol (COREG) 3.125 MG tablet Take 1 tablet (3.125 mg total) by mouth 2 (two) times daily with a meal.   clobetasol ointment (TEMOVATE) 07.98% Apply 1 application topically daily as needed (eczema).    clotrimazole-betamethasone (LOTRISONE) cream Apply 1 application topically 2 (two) times daily.   EPINEPHrine 0.3 mg/0.3 mL IJ  SOAJ injection Inject 0.3 mLs (0.3 mg total) into the muscle once.   escitalopram (LEXAPRO) 10 MG tablet Take 1 tablet by mouth once daily   fexofenadine (ALLEGRA) 180 MG tablet 1 tablet Swallow whole with water; do not take with fruit juices.   ibuprofen (ADVIL) 200 MG tablet Take 2 tablets (400 mg total) by mouth every 8 (eight) hours as needed for moderate pain (with food).   levothyroxine (SYNTHROID) 88 MCG tablet Take 1 tablet (88 mcg total) by mouth daily.   losartan (COZAAR) 25 MG tablet Take 1 tablet (25 mg total) by mouth daily. Take an additional 12.'5mg'$  if BP > 140/80   Facility-Administered Encounter Medications as of 05/25/2022  Medication   triamcinolone acetonide (KENALOG) 10 MG/ML injection 10 mg    Allergies (verified) Bee venom, Codeine, Pneumovax 23 [pneumococcal vac polyvalent], Lisinopril, Hornet venom, Sulfa antibiotics, and Sulfonamide derivatives   History: Past Medical History:  Diagnosis Date   Blood transfusion without reported diagnosis    BRONCHITIS    CAD (coronary artery disease)    LAD stent 2003 ([facility]).     Cataract    DYSPNEA    HLD (hyperlipidemia)    Hypothyroidism    Mobitz type 2 second degree heart block    s/p PPM by Dr BOlevia Perches2004   Osteopenia    PACEMAKER, PERMANENT    Past Surgical History:  Procedure Laterality  Date   CARDIAC CATHETERIZATION     CYSTOSCOPY WITH RETROGRADE PYELOGRAM, URETEROSCOPY AND STENT PLACEMENT  08/19/2012   Procedure: CYSTOSCOPY WITH RETROGRADE PYELOGRAM, URETEROSCOPY AND STENT PLACEMENT;  Surgeon: Molli Hazard, MD;  Location: [facility];  Service: Urology;  Laterality: Right;   MYOMECTOMY     PACEMAKER GENERATOR CHANGE N/A 12/22/2011   Procedure: PACEMAKER GENERATOR CHANGE;  Surgeon: Thompson Grayer, MD;  Location[facility] CATH LAB;  Service: Cardiovascular;  Laterality: N/A;   PACEMAKER INSERTION  2004, 12/22/11   initial implant by Dr Olevia Perches, Generator change (MDT Adapta L) by Dr Rayann Heman 6/13   TOTAL  ABDOMINAL HYSTERECTOMY W/ BILATERAL SALPINGOOPHORECTOMY     Family History  Problem Relation Age of Onset   Heart attack Mother        died at age 83   Breast cancer Mother        age 91, had mastectomy.    Prostate cancer Father    Colon cancer Neg Hx    Esophageal cancer Neg Hx    Rectal cancer Neg Hx    Social History   Socioeconomic History   Marital status: Married    Spouse name: Not on file   Number of children: 0   Years of education: Not on file   Highest education level: Not on file  Occupational History   Occupation: retired  Tobacco Use   Smoking status: Never   Smokeless tobacco: Never  Vaping Use   Vaping Use: Never used  Substance and Sexual Activity   Alcohol use: Yes    Comment: 1-2 drink daily   Drug use: No   Sexual activity: Yes  Other Topics Concern   Not on file  Social History Narrative   Retired Furniture conservator/restorer   Enjoys RV, bikes   No children   No pets   Social Determinants of Health   Financial Resource Strain: [place]  (05/25/2022)   Overall Financial Resource Strain (CARDIA)    Difficulty of Paying Living Expenses: Not hard at all  Food Insecurity: No [place] (05/25/2022)   Hunger Vital Sign    Worried About Running Out of Food in the Last Year: Never true    [place] in the Last Year: Never true  Transportation Needs: No Transportation Needs (05/25/2022)   PRAPARE - Hydrologist (Medical): No    Lack of Transportation (Non-Medical): No  Physical Activity: Not on file  Stress: No Stress Concern Present (05/25/2022)   [place]    Feeling of Stress : Not at all  Social Connections: Unknown (05/25/2022)   Social Connection and Isolation Panel [NHANES]    Frequency of Communication with Friends and Family: Not on file    Frequency of Social Gatherings with Friends and Family: Not on file    Attends Religious Services: Not  on file    Active Member of Clubs or Organizations: Not on file    Attends Archivist Meetings: Not on file    Marital Status: Married    Tobacco Counseling Counseling given: Not Answered   Clinical Intake:  Pre-visit preparation completed: Yes        Diabetes: No  How often do you need to have someone help you when you read instructions, pamphlets, or other written materials from your doctor or pharmacy?: 1 - Never   Interpreter Needed?: No      Activities of Daily  Living    05/25/2022    9:11 AM 05/27/2021    3:03 PM  In your present state of health, do you have any difficulty performing the following activities:  Hearing? 0 0  Vision? 0 0  Difficulty concentrating or making decisions? 0 0  Walking or climbing stairs? 0 0  Dressing or bathing? 0 0  Doing errands, shopping? 0 0  Preparing Food and eating ? N   Using the Toilet? N   In the past six months, have you accidently leaked urine? N   Do you have problems with loss of bowel control? N   Managing your Medications? N   Managing your Finances? N   Housekeeping or managing your Housekeeping? N     Patient Care Team: Burnard Hawthorne, FNP as PCP - General (Family Medicine) Vickie Epley, MD as PCP - Electrophysiology (Cardiology) Gatha Mayer, MD as Consulting Physician (Gastroenterology) Minus Breeding, MD as Consulting Physician (Cardiology)  Indicate any recent Medical Services you may have received from other than [facility] providers in the past year (date may be approximate).     Assessment:   This is a routine wellness examination for Dailin.  I connected with  Linus Galas Muhammed on 05/25/22 by a audio enabled telemedicine application and verified that I am speaking with the correct person using two identifiers.  Patient Location: Home  Provider Location: Office/Clinic  I discussed the limitations of evaluation and management by telemedicine. The patient expressed  understanding and agreed to proceed.   Hearing/Vision screen Hearing Screening - Comments:: Patient is able to hear conversational tones without difficulty.  No issues reported.   Vision Screening - Comments:: Followed by [facility], Dr. George Ina Wears corrective lenses at night while driving Cataract extraction, bilateral They have seen their ophthalmologist in the last 12 months.    Dietary issues and exercise activities discussed: Current Exercise Habits: Structured exercise class, Type of exercise: yoga;walking;calisthenics;strength training/weights, Time (Minutes): 60, Frequency (Times/Week): 5, Weekly Exercise (Minutes/Week): 300, Intensity: Mild   Goals Addressed             This Visit's Progress    Maintain Healthy Lifestyle   On track    Stay active Healthy diet Good water intake       Depression Screen    05/25/2022    9:10 AM 02/22/2022   11:22 AM 09/20/2020   10:33 AM 06/03/2018    9:13 AM 05/30/2018    9:31 AM 09/15/2016   10:54 AM 07/11/2016    2:23 PM  PHQ 2/9 Scores  PHQ - 2 Score 0 0 0 0 0 0 0    Fall Risk    05/25/2022    9:11 AM 02/22/2022   11:21 AM 05/27/2021    3:02 PM 12/22/2019    8:46 AM 06/03/2018    9:12 AM  Fall Risk   Falls in the past year? 0 0 0 0 0  Number falls in past yr: 0  0 0   Injury with Fall?   0 0 0  Risk for fall due to : No Fall Risks No Fall Risks     Follow up Falls evaluation completed;Falls prevention discussed Falls evaluation completed       FALL RISK PREVENTION PERTAINING TO THE HOME: Home free of loose throw rugs in walkways, pet beds, electrical cords, etc? No  Adequate lighting in your home to reduce risk of falls? No   ASSISTIVE DEVICES UTILIZED TO PREVENT FALLS: Life alert?  No  Use of a cane, walker or w/c? No   TIMED UP AND GO: Was the test performed? No .   Cognitive Function:  Patient is alert and oriented x3.  Denies difficulty with memory loss or making decisions. 100% independent.   Manages her own finances and medications.       05/25/2022    9:16 AM  6CIT Screen  Months in reverse 0 points    Immunizations Immunization History  Administered Date(s) Administered   Fluad Quad(high Dose 65+) 04/17/2019, 04/26/2020, 04/29/2021, 04/24/2022   Influenza Whole 06/01/2008, 05/06/2012   Influenza, High Dose Seasonal PF 05/12/2015, 04/28/2016, 04/27/2018   Influenza-Unspecified 05/17/2013, 06/10/2017   PFIZER(Purple Top)SARS-COV-2 Vaccination 08/13/2019, 09/03/2019, 03/24/2020   Pfizer Covid-19 Vaccine Bivalent Booster 9yr & up 04/04/2021   Pneumococcal Conjugate-13 11/23/2014   Pneumococcal Polysaccharide-23 05/04/2008, 04/28/2016   Respiratory Syncytial Virus Vaccine,Recomb Aduvanted(Arexvy) 05/21/2022   Td 10/06/2003, 11/14/2005   Tdap 05/02/2011, 06/24/2021   Zoster Recombinat (Shingrix) 01/10/2018, 03/19/2018   Zoster, Live 07/16/2007   Screening Tests Health Maintenance  Topic Date Due   COVID-19 Vaccine (5 - Pfizer series) 06/10/2022 (Originally 08/04/2021)   Medicare Annual Wellness (AWV)  05/26/2023   COLONOSCOPY (Pts 45-430yrInsurance coverage will need to be confirmed)  10/16/2023   TETANUS/TDAP  06/25/2031   Pneumonia Vaccine 6537Years old  Completed   INFLUENZA VACCINE  Completed   DEXA SCAN  Completed   Hepatitis C Screening  Completed   Zoster Vaccines- Shingrix  Completed   HPV VACCINES  Aged Out   Health Maintenance There are no preventive care reminders to display for this patient.  Lung Cancer Screening: (Low Dose CT Chest recommended if Age 75-80ears, 30 pack-year currently smoking OR have quit w/in 15years.) does not qualify.   Hepatitis C Screening: Completed 2017.  Vision Screening: Recommended annual ophthalmology exams for early detection of glaucoma and other disorders of the eye.  Dental Screening: Recommended annual dental exams for proper oral hygiene.  Community Resource Referral / Chronic Care Management: CRR  required this visit?  No   CCM required this visit?  No      Plan:     I have personally reviewed and noted the following in the patient's chart:   Medical and social history Use of alcohol, tobacco or illicit drugs  Current medications and supplements including opioid prescriptions. Patient is not currently taking opioid prescriptions. Functional ability and status Nutritional status Physical activity Advanced directives List of other physicians Hospitalizations, surgeries, and ER visits in previous 12 months Vitals Screenings to include cognitive, depression, and falls Referrals and appointments  In addition, I have reviewed and discussed with patient certain preventive protocols, quality metrics, and best practice recommendations. A written personalized care plan for preventive services as well as general preventive health recommendations were provided to patient.     DeLeta JunglingLPN   1101/12/107

## 2022-05-25 NOTE — Patient Instructions (Addendum)
Madeline Schaefer , Thank you for taking time to come for your Medicare Wellness Visit. I appreciate your ongoing commitment to your health goals. Please review the following plan we discussed and let me know if I can assist you in the future.   These are the goals we discussed:  Goals      Maintain Healthy Lifestyle     Stay active Healthy diet Good water intake        This is a list of the screening recommended for you and due dates:  Health Maintenance  Topic Date Due   COVID-19 Vaccine (5 - Pfizer series) 06/10/2022*   Medicare Annual Wellness Visit  05/26/2023   Colon Cancer Screening  10/16/2023   Tetanus Vaccine  06/25/2031   Pneumonia Vaccine  Completed   Flu Shot  Completed   DEXA scan (bone density measurement)  Completed   Hepatitis C Screening: USPSTF Recommendation to screen - Ages 40-79 yo.  Completed   Zoster (Shingles) Vaccine  Completed   HPV Vaccine  Aged Out  *Topic was postponed. The date shown is not the original due date.    Advanced directives: End of life planning; Advance aging; Advanced directives discussed.  Copy of current HCPOA/Living Will requested.    Conditions/risks identified: none new  Next appointment: Follow up in one year for your annual wellness visit    Preventive Care 65 Years and Older, Female Preventive care refers to lifestyle choices and visits with your health care provider that can promote health and wellness. What does preventive care include? A yearly physical exam. This is also called an annual well check. Dental exams once or twice a year. Routine eye exams. Ask your health care provider how often you should have your eyes checked. Personal lifestyle choices, including: Daily care of your teeth and gums. Regular physical activity. Eating a healthy diet. Avoiding tobacco and drug use. Limiting alcohol use. Practicing safe sex. Taking low-dose aspirin every day. Taking vitamin and mineral supplements as recommended by your  health care provider. What happens during an annual well check? The services and screenings done by your health care provider during your annual well check will depend on your age, overall health, lifestyle risk factors, and family history of disease. Counseling  Your health care provider may ask you questions about your: Alcohol use. Tobacco use. Drug use. Emotional well-being. Home and relationship well-being. Sexual activity. Eating habits. History of falls. Memory and ability to understand (cognition). Work and work Statistician. Reproductive health. Screening  You may have the following tests or measurements: Height, weight, and BMI. Blood pressure. Lipid and cholesterol levels. These may be checked every 5 years, or more frequently if you are over 31 years old. Skin check. Lung cancer screening. You may have this screening every year starting at age 108 if you have a 30-pack-year history of smoking and currently smoke or have quit within the past 15 years. Fecal occult blood test (FOBT) of the stool. You may have this test every year starting at age 6. Flexible sigmoidoscopy or colonoscopy. You may have a sigmoidoscopy every 5 years or a colonoscopy every 10 years starting at age 38. Hepatitis C blood test. Hepatitis B blood test. Sexually transmitted disease (STD) testing. Diabetes screening. This is done by checking your blood sugar (glucose) after you have not eaten for a while (fasting). You may have this done every 1-3 years. Bone density scan. This is done to screen for osteoporosis. You may have this done starting at  age 54. Mammogram. This may be done every 1-2 years. Talk to your health care provider about how often you should have regular mammograms. Talk with your health care provider about your test results, treatment options, and if necessary, the need for more tests. Vaccines  Your health care provider may recommend certain vaccines, such as: Influenza vaccine.  This is recommended every year. Tetanus, diphtheria, and acellular pertussis (Tdap, Td) vaccine. You may need a Td booster every 10 years. Zoster vaccine. You may need this after age 76. Pneumococcal 13-valent conjugate (PCV13) vaccine. One dose is recommended after age 48. Pneumococcal polysaccharide (PPSV23) vaccine. One dose is recommended after age 45. Talk to your health care provider about which screenings and vaccines you need and how often you need them. This information is not intended to replace advice given to you by your health care provider. Make sure you discuss any questions you have with your health care provider. Document Released: 07/30/2015 Document Revised: 03/22/2016 Document Reviewed: 05/04/2015 Elsevier Interactive Patient Education  2017 [place] Prevention in the Home Falls can cause injuries. They can happen to people of all ages. There are many things you can do to make your home safe and to help prevent falls. What can I do on the outside of my home? Regularly fix the edges of walkways and driveways and fix any cracks. Remove anything that might make you trip as you walk through a door, such as a raised step or threshold. Trim any bushes or trees on the path to your home. Use bright outdoor lighting. Clear any walking paths of anything that might make someone trip, such as rocks or tools. Regularly check to see if handrails are loose or broken. Make sure that both sides of any steps have handrails. Any raised decks and porches should have guardrails on the edges. Have any leaves, snow, or ice cleared regularly. Use sand or salt on walking paths during winter. Clean up any spills in your garage right away. This includes oil or grease spills. What can I do in the bathroom? Use night lights. Install grab bars by the toilet and in the tub and shower. Do not use towel bars as grab bars. Use non-skid mats or decals in the tub or shower. If you need to sit  down in the shower, use a plastic, non-slip stool. Keep the floor dry. Clean up any water that spills on the floor as soon as it happens. Remove soap buildup in the tub or shower regularly. Attach bath mats securely with double-sided non-slip rug tape. Do not have throw rugs and other things on the floor that can make you trip. What can I do in the bedroom? Use night lights. Make sure that you have a light by your bed that is easy to reach. Do not use any sheets or blankets that are too big for your bed. They should not hang down onto the floor. Have a firm chair that has side arms. You can use this for support while you get dressed. Do not have throw rugs and other things on the floor that can make you trip. What can I do in the kitchen? Clean up any spills right away. Avoid walking on wet floors. Keep items that you use a lot in easy-to-reach places. If you need to reach something above you, use a strong step stool that has a grab bar. Keep electrical cords out of the way. Do not use floor polish or wax that makes floors  slippery. If you must use wax, use non-skid floor wax. Do not have throw rugs and other things on the floor that can make you trip. What can I do with my stairs? Do not leave any items on the stairs. Make sure that there are handrails on both sides of the stairs and use them. Fix handrails that are broken or loose. Make sure that handrails are as long as the stairways. Check any carpeting to make sure that it is firmly attached to the stairs. Fix any carpet that is loose or worn. Avoid having throw rugs at the top or bottom of the stairs. If you do have throw rugs, attach them to the floor with carpet tape. Make sure that you have a light switch at the top of the stairs and the bottom of the stairs. If you do not have them, ask someone to add them for you. What else can I do to help prevent falls? Wear shoes that: Do not have high heels. Have rubber bottoms. Are  comfortable and fit you well. Are closed at the toe. Do not wear sandals. If you use a stepladder: Make sure that it is fully opened. Do not climb a closed stepladder. Make sure that both sides of the stepladder are locked into place. Ask someone to hold it for you, if possible. Clearly mark and make sure that you can see: Any grab bars or handrails. First and last steps. Where the edge of each step is. Use tools that help you move around (mobility aids) if they are needed. These include: Canes. Walkers. Scooters. Crutches. Turn on the lights when you go into a dark area. Replace any light bulbs as soon as they burn out. Set up your furniture so you have a clear path. Avoid moving your furniture around. If any of your floors are uneven, fix them. If there are any pets around you, be aware of where they are. Review your medicines with your doctor. Some medicines can make you feel dizzy. This can increase your chance of falling. Ask your doctor what other things that you can do to help prevent falls. This information is not intended to replace advice given to you by your health care provider. Make sure you discuss any questions you have with your health care provider. Document Released: 04/29/2009 Document Revised: 12/09/2015 Document Reviewed: 08/07/2014 Elsevier Interactive Patient Education  2017 [employer].

## 2022-05-30 ENCOUNTER — Encounter: Payer: Self-pay | Admitting: Family

## 2022-05-30 ENCOUNTER — Ambulatory Visit: Payer: Medicare PPO | Admitting: Internal Medicine

## 2022-05-30 ENCOUNTER — Ambulatory Visit: Payer: Medicare PPO | Admitting: Family

## 2022-05-30 VITALS — BP 128/78 | HR 70 | Temp 98.6°F | Ht 70.0 in | Wt 163.8 lb

## 2022-05-30 DIAGNOSIS — F4323 Adjustment disorder with mixed anxiety and depressed mood: Secondary | ICD-10-CM

## 2022-05-30 DIAGNOSIS — R7303 Prediabetes: Secondary | ICD-10-CM

## 2022-05-30 DIAGNOSIS — I10 Essential (primary) hypertension: Secondary | ICD-10-CM | POA: Diagnosis not present

## 2022-05-30 LAB — COMPREHENSIVE METABOLIC PANEL
ALT: 25 U/L (ref 0–35)
AST: 18 U/L (ref 0–37)
Albumin: 4.1 g/dL (ref 3.5–5.2)
Alkaline Phosphatase: 69 U/L (ref 39–117)
BUN: 17 mg/dL (ref 6–23)
CO2: 31 mEq/L (ref 19–32)
Calcium: 8.8 mg/dL (ref 8.4–10.5)
Chloride: 101 mEq/L (ref 96–112)
Creatinine, Ser: 0.82 mg/dL (ref 0.40–1.20)
GFR: 69.75 mL/min (ref >60.00)
Glucose, Bld: 88 mg/dL (ref 70–99)
Potassium: 4.4 mEq/L (ref 3.5–5.1)
Sodium: 137 mEq/L (ref 135–145)
Total Bilirubin: 0.8 mg/dL (ref 0.2–1.2)
Total Protein: 6.8 g/dL (ref 6.0–8.3)

## 2022-05-30 LAB — HEMOGLOBIN A1C: Hgb A1c MFr Bld: 6.3 % (ref 4.6–6.5)

## 2022-05-30 NOTE — Progress Notes (Signed)
Subjective:    Patient ID: Madeline Schaefer, female    DOB: 03-15-1946, 76 y.o.   MRN: [id number]  CC: Madeline Schaefer is a 76 y.o. female who presents today for follow up.   HPI: Feels well today.  No new complaints   She is compliant with losartan '25mg'$ , carvedilol 3.125 mg twice daily.  She has not needed to take additional losartan 12.5 mg as needed  At home,  average 130/65-70.   She would like to stop lexapro as doesn't feel she needs for depression.    HISTORY:  Past Medical History:  Diagnosis Date   Blood transfusion without reported diagnosis    BRONCHITIS    CAD (coronary artery disease)    LAD stent 2003 ([facility]).     Cataract    DYSPNEA    HLD (hyperlipidemia)    Hypothyroidism    Mobitz type 2 second degree heart block    s/p PPM by Dr Olevia Perches 2004   Osteopenia    PACEMAKER, PERMANENT    Past Surgical History:  Procedure Laterality Date   CARDIAC CATHETERIZATION     CYSTOSCOPY WITH RETROGRADE PYELOGRAM, URETEROSCOPY AND STENT PLACEMENT  08/19/2012   Procedure: CYSTOSCOPY WITH RETROGRADE PYELOGRAM, URETEROSCOPY AND STENT PLACEMENT;  Surgeon: Molli Hazard, MD;  Location: [facility];  Service: Urology;  Laterality: Right;   MYOMECTOMY     PACEMAKER GENERATOR CHANGE N/A 12/22/2011   Procedure: PACEMAKER GENERATOR CHANGE;  Surgeon: Thompson Grayer, MD;  Location: [facility] CATH LAB;  Service: Cardiovascular;  Laterality: N/A;   PACEMAKER INSERTION  2004, 12/22/11   initial implant by Dr Olevia Perches, Generator change (MDT Adapta L) by Dr Rayann Heman 6/13   TOTAL ABDOMINAL HYSTERECTOMY W/ BILATERAL SALPINGOOPHORECTOMY     Family History  Problem Relation Age of Onset   Heart attack Mother        died at age 35   Breast cancer Mother        age 38, had mastectomy.    Prostate cancer Father    Colon cancer Neg Hx    Esophageal cancer Neg Hx    Rectal cancer Neg Hx     Allergies: Bee venom, Codeine, Pneumovax 23 [pneumococcal vac polyvalent], Lisinopril,  Hornet venom, Sulfa antibiotics, and Sulfonamide derivatives Current Outpatient Medications on File Prior to Visit  Medication Sig Dispense Refill   aspirin EC 81 MG tablet Take 81 mg by mouth daily.     atorvastatin (LIPITOR) 80 MG tablet Take 1 tablet by mouth once daily 90 tablet 3   atorvastatin (LIPITOR) 80 MG tablet Take 1 tablet (80 mg total) by mouth daily. 90 tablet 4   carvedilol (COREG) 3.125 MG tablet Take 1 tablet (3.125 mg total) by mouth 2 (two) times daily with a meal. 180 tablet 2   clobetasol ointment (TEMOVATE) 6.59 % Apply 1 application topically daily as needed (eczema).      clotrimazole-betamethasone (LOTRISONE) cream Apply 1 application topically 2 (two) times daily. 30 g 0   EPINEPHrine 0.3 mg/0.3 mL IJ SOAJ injection Inject 0.3 mLs (0.3 mg total) into the muscle once. 2 Device 1   fexofenadine (ALLEGRA) 180 MG tablet 1 tablet Swallow whole with water; do not take with fruit juices.     ibuprofen (ADVIL) 200 MG tablet Take 2 tablets (400 mg total) by mouth every 8 (eight) hours as needed for moderate pain (with food). 30 tablet 0   levothyroxine (SYNTHROID) 88 MCG tablet Take 1 tablet (88 mcg total) by  mouth daily. 90 tablet 3   losartan (COZAAR) 25 MG tablet Take 1 tablet (25 mg total) by mouth daily. Take an additional 12.'5mg'$  if BP > 140/80 90 tablet 1   Current Facility-Administered Medications on File Prior to Visit  Medication Dose Route Frequency Provider Last Rate Last Admin   triamcinolone acetonide (KENALOG) 10 MG/ML injection 10 mg  10 mg Other Once Landis Martins, DPM        Social History   Tobacco Use   Smoking status: Never   Smokeless tobacco: Never  Vaping Use   Vaping Use: Never used  Substance Use Topics   Alcohol use: Yes    Comment: 1-2 drink daily   Drug use: No    Review of Systems  Constitutional:  Negative for chills and fever.  Respiratory:  Negative for cough.   Cardiovascular:  Negative for chest pain and palpitations.   Gastrointestinal:  Negative for nausea and vomiting.      Objective:    BP 128/78 (BP Location: Left Arm, Patient Position: Sitting, Cuff Size: Normal)   Pulse 70   Temp 98.6 F (37 C) (Oral)   Ht '5\' 10"'$  (1.778 m)   Wt 163 lb 12.8 oz (74.3 kg)   LMP 10/17/1986   SpO2 99%   BMI 23.50 kg/m  BP Readings from Last 3 Encounters:  05/30/22 128/78  02/22/22 (!) 142/70  12/20/21 (!) 142/76   Wt Readings from Last 3 Encounters:  05/30/22 163 lb 12.8 oz (74.3 kg)  05/25/22 164 lb (74.4 kg)  02/22/22 164 lb 12.8 oz (74.8 kg)    Physical Exam Vitals reviewed.  Constitutional:      Appearance: She is well-developed.  Eyes:     Conjunctiva/sclera: Conjunctivae normal.  Cardiovascular:     Rate and Rhythm: Normal rate and regular rhythm.     Pulses: Normal pulses.     Heart sounds: Normal heart sounds.  Pulmonary:     Effort: Pulmonary effort is normal.     Breath sounds: Normal breath sounds. No wheezing, rhonchi or rales.  Skin:    General: Skin is warm and dry.  Neurological:     Mental Status: She is alert.  Psychiatric:        Speech: Speech normal.        Behavior: Behavior normal.        Thought Content: Thought content normal.        Assessment & Plan:   Problem List Items Addressed This Visit       Cardiovascular and Mediastinum   Essential hypertension    Chronic, excellent control.  Continue losartan '25mg'$ , carvedilol 3.125 mg twice daily.  She has not needed to take additional losartan 12.5 mg as needed, however if BP > 140/80, she may use.        Relevant Orders   Comprehensive metabolic panel     Other   Adjustment disorder with mixed anxiety and depressed mood    Resolved at this time.  Under excellent control.  I advised patient how to wean off of Lexapro.  She will let me know how she is doing      Other Visit Diagnoses     Prediabetes    -  Primary   Relevant Orders   Hemoglobin A1c        I have discontinued Willa Rough W. Odor's  escitalopram. I am also having her maintain her aspirin EC, clobetasol ointment, EPINEPHrine, ibuprofen, clotrimazole-betamethasone, fexofenadine, losartan, levothyroxine, atorvastatin, atorvastatin, and carvedilol.  We will continue to administer triamcinolone acetonide.   No orders of the defined types were placed in this encounter.   Return precautions given.   Risks, benefits, and alternatives of the medications and treatment plan prescribed today were discussed, and patient expressed understanding.   Education regarding symptom management and diagnosis given to patient on AVS.  Continue to follow with Burnard Hawthorne, FNP for routine health maintenance.   Madeline Schaefer and I agreed with plan.   Mable Paris, FNP

## 2022-05-30 NOTE — Assessment & Plan Note (Signed)
Resolved at this time.  Under excellent control.  I advised patient how to wean off of Lexapro.  She will let me know how she is doing

## 2022-05-30 NOTE — Patient Instructions (Addendum)
You can take lexapro '5mg'$  every day for one week, then stop medication completely.   Nice to see you!

## 2022-05-30 NOTE — Assessment & Plan Note (Signed)
Chronic, excellent control.  Continue losartan '25mg'$ , carvedilol 3.125 mg twice daily.  She has not needed to take additional losartan 12.5 mg as needed, however if BP > 140/80, she may use.

## 2022-06-07 ENCOUNTER — Ambulatory Visit (INDEPENDENT_AMBULATORY_CARE_PROVIDER_SITE_OTHER): Payer: Medicare PPO

## 2022-06-07 DIAGNOSIS — I441 Atrioventricular block, second degree: Secondary | ICD-10-CM

## 2022-06-13 LAB — CUP PACEART REMOTE DEVICE CHECK
Battery Impedance: 2021 Ohm
Battery Remaining Longevity: 35 mo
Battery Voltage: 2.74 V
Brady Statistic AP VP Percent: 28 %
Brady Statistic AP VS Percent: 0 %
Brady Statistic AS VP Percent: 72 %
Brady Statistic AS VS Percent: 0 %
Date Time Interrogation Session: 20231127213024
Implantable Lead Connection Status: 753985
Implantable Lead Connection Status: 753985
Implantable Lead Implant Date: 20040907
Implantable Lead Implant Date: 20040907
Implantable Lead Location: 753859
Implantable Lead Location: 753860
Implantable Lead Model: 5076
Implantable Lead Model: 5076
Implantable Pulse Generator Implant Date: 20130607
Lead Channel Impedance Value: 439 Ohm
Lead Channel Impedance Value: 733 Ohm
Lead Channel Pacing Threshold Amplitude: 0.5 V
Lead Channel Pacing Threshold Amplitude: 0.75 V
Lead Channel Pacing Threshold Pulse Width: 0.4 ms
Lead Channel Pacing Threshold Pulse Width: 0.4 ms
Lead Channel Setting Pacing Amplitude: 2 V
Lead Channel Setting Pacing Amplitude: 2.5 V
Lead Channel Setting Pacing Pulse Width: 0.4 ms
Lead Channel Setting Sensing Sensitivity: 2 mV
Zone Setting Status: 755011
Zone Setting Status: 755011

## 2022-06-14 ENCOUNTER — Telehealth: Payer: Self-pay | Admitting: Family

## 2022-06-14 NOTE — Telephone Encounter (Signed)
Patient's claim for 05/25/22 was denied. The EOB stated that the G0439 annual wellness visit are only allowed once per calendar year. This wellness visit exceed that frequency; therefore, it is not allowed. I don't see the code used twice. Could you please help with this?

## 2022-06-14 NOTE — Telephone Encounter (Signed)
The patient claim for 05/25/22 was denied.

## 2022-06-19 ENCOUNTER — Telehealth: Payer: Self-pay | Admitting: Internal Medicine

## 2022-06-19 NOTE — Telephone Encounter (Signed)
Pt stated that she has been having issues with Reflux for the last month: Taking OTC Gaviscon which helps some: Pt chart was reviewed. Noted that it has been several years since pt was seen in the office: Pt was scheduled for an office visit on 07/04/2022 at 1:30 to see Vicie Mutters PA: Pt made aware: Address Provided: Pt verbalized understanding with all questions answered.

## 2022-06-19 NOTE — Telephone Encounter (Signed)
Inbound call from patient requesting a call back today. Says she is experiencing GERD and does not know what do to. Please advise.

## 2022-06-22 ENCOUNTER — Encounter: Payer: Self-pay | Admitting: Nurse Practitioner

## 2022-06-22 ENCOUNTER — Ambulatory Visit
Admission: RE | Admit: 2022-06-22 | Discharge: 2022-06-22 | Disposition: A | Discharge status: Home / Self Care | Payer: Medicare PPO | Source: Ambulatory Visit | Attending: Family | Admitting: Family

## 2022-06-22 ENCOUNTER — Telehealth: Payer: Self-pay | Admitting: Cardiology

## 2022-06-22 ENCOUNTER — Other Ambulatory Visit: Payer: Self-pay | Admitting: Family

## 2022-06-22 ENCOUNTER — Ambulatory Visit: Payer: Medicare PPO | Attending: Nurse Practitioner | Admitting: Nurse Practitioner

## 2022-06-22 VITALS — BP 138/72 | HR 62 | Ht 70.0 in | Wt 159.6 lb

## 2022-06-22 DIAGNOSIS — K219 Gastro-esophageal reflux disease without esophagitis: Secondary | ICD-10-CM | POA: Diagnosis not present

## 2022-06-22 DIAGNOSIS — I441 Atrioventricular block, second degree: Secondary | ICD-10-CM

## 2022-06-22 DIAGNOSIS — Z1231 Encounter for screening mammogram for malignant neoplasm of breast: Secondary | ICD-10-CM | POA: Insufficient documentation

## 2022-06-22 DIAGNOSIS — I351 Nonrheumatic aortic (valve) insufficiency: Secondary | ICD-10-CM

## 2022-06-22 DIAGNOSIS — E785 Hyperlipidemia, unspecified: Secondary | ICD-10-CM | POA: Diagnosis not present

## 2022-06-22 DIAGNOSIS — I2510 Atherosclerotic heart disease of native coronary artery without angina pectoris: Secondary | ICD-10-CM

## 2022-06-22 DIAGNOSIS — I10 Essential (primary) hypertension: Secondary | ICD-10-CM | POA: Diagnosis not present

## 2022-06-22 MED ORDER — PANTOPRAZOLE SODIUM 40 MG PO TBEC: 40.0000 mg | DELAYED_RELEASE_TABLET | Freq: Every day | ORAL | 3 refills | Status: DC

## 2022-06-22 NOTE — Telephone Encounter (Signed)
Spoke with patient of Dr. Percival Spanish. She has a pacemaker. She reports for 3 weeks she has a pain in her chest, that gurgles down into the esophagus. She also reports some pain down her backbone. She does yoga and walks the dog and has no issues. She is unsure if this is GI related or cardiac related but would like to be seen. Last visit was >6 months ago. She has a mammogram at [facility] today so she was scheduled for a visit with Angelica Ran FNP today at 11:20 at same location. There were no openings with Dr. Percival Spanish or APPs at [facility].

## 2022-06-22 NOTE — Patient Instructions (Signed)
Medication Instructions:  Your physician has recommended you make the following change in your medication:   START Protonix 40 mg once daily   *If you need a refill on your cardiac medications before your next appointment, please call your pharmacy*   Lab Work: None  If you have labs (blood work) drawn today and your tests are completely normal, you will receive your results only by: [place] (if you have MyChart) OR A paper copy in the mail If you have any lab test that is abnormal or we need to change your treatment, we will call you to review the results.   Testing/Procedures: None   Follow-Up: At [facility], you and your health needs are our priority.  As part of our continuing mission to provide you with exceptional heart care, we have created designated Provider Care Teams.  These Care Teams include your primary Cardiologist (physician) and Advanced Practice Providers (APPs -  Physician Assistants and Nurse Practitioners) who all work together to provide you with the care you need, when you need it.    Your next appointment:   Keep your scheduled appointments   Provider:   Minus Breeding, MD         Important Information About Sugar

## 2022-06-22 NOTE — Telephone Encounter (Signed)
Pt c/o of Chest Pain: STAT if CP now or developed within 24 hours  1. Are you having CP right now?   No  2. Are you experiencing any other symptoms (ex. SOB, nausea, vomiting, sweating)? No  3. How long have you been experiencing CP?  Started 3 weeks ago  4. Is your CP continuous or coming and going?   Coming an going  5. Have you taken Nitroglycerin?  No?   Patient stated she is having some gurgling in her chest.  Patient stated she has pain between breasts in chest and in her back.

## 2022-06-22 NOTE — Progress Notes (Signed)
Office Visit    Patient Name: Madeline Schaefer Date of Encounter: 06/22/2022  Primary Care Provider:  Burnard Hawthorne, FNP Primary Cardiologist:  Minus Breeding, MD  Chief Complaint    76 year old female with a history of coronary artery disease and remote LAD stenting (2003), Mobitz 2 heart block status post permanent pacemaker, hyperlipidemia, hypothyroidism, and moderate aortic insufficiency, who presents for follow-up related to chest pain.  Past Medical History    Past Medical History:  Diagnosis Date   Aortic insufficiency    a. 12/2020 Echo: EF 55-60%, no rwma, nl RV fxn, RVSP 31.29mHg, mild MR, mod TR, mild Ao sclerosis. Mod AI. Mod dil PA.   Aortic valve sclerosis    Blood transfusion without reported diagnosis    BRONCHITIS    CAD (coronary artery disease)    a. 08/2001 PCI: LAD [id number] [facility] BMS); b. 12/2009 Cath: LM nl, LAD <30ISR, RI nl, LCX nl, RCA nl, EF nl; c. 01/2017 MV: small apical inf defect w/o ischemia. EF 59%; d. 11/2021 Lexi PET/CT: EF 56% (61% w/ stress), small fixed apical/inf defect 2/2 PPM (artifact). No ischemia-->low risk.   Cardiac pacemaker in situ    a. 2004 s/p PPM (Madeline Schaefer); b. 12/2011 Gen Change (Madeline Schaefer): MD Adapta L ADDRL1 DC PPM (ser # Madeline Schaefer   Cataract    DYSPNEA    HLD (hyperlipidemia)    Hypothyroidism    Mobitz type 2 second degree heart block    a. 2004 s/p PPM (Madeline Schaefer); b. 12/2011 Gen Change (Madeline Schaefer): MD Adapta L ADDRL1 DC PPM (ser # Madeline Schaefer   Osteopenia    Tricuspid regurgitation    Past Surgical History:  Procedure Laterality Date   CARDIAC CATHETERIZATION     CYSTOSCOPY WITH RETROGRADE PYELOGRAM, URETEROSCOPY AND STENT PLACEMENT  08/19/2012   Procedure: CYSTOSCOPY WITH RETROGRADE PYELOGRAM, URETEROSCOPY AND STENT PLACEMENT;  Surgeon: Madeline HazardMD;  Location: [facility] Service: Urology;  Laterality: Right;   MYOMECTOMY     PACEMAKER GENERATOR CHANGE N/A 12/22/2011   Procedure: PACEMAKER  GENERATOR CHANGE;  Surgeon: Madeline GrayerMD;  [facility];  Service: Cardiovascular;  Laterality: N/A;   PACEMAKER INSERTION  2004, 12/22/11   initial implant by Dr Madeline PerchesGenerator change (MDT Adapta L) by Dr Madeline Schaefer/13   TOTAL ABDOMINAL HYSTERECTOMY W/ BILATERAL SALPINGOOPHORECTOMY      Allergies  Allergies  Allergen Reactions   Bee Venom Anaphylaxis   Codeine Other (See Comments) and Rash    REACTION: Passed  out, facial swelling and hives   Pneumovax 23 [Pneumococcal Vac Polyvalent] Swelling    Local swelling and pain   Lisinopril     COUGH   Hornet Venom Hives    Hives over entire body   Sulfa Antibiotics Hives   Sulfonamide Derivatives Hives    History of Present Illness    [age]ear old female with the above past medical history including CAD, Mobitz 2 heart block, hyperlipidemia, hypothyroidism, and moderate aortic insufficiency.  She initially underwent bare-metal stenting of the LAD in 2003.  In the setting of Mobitz 2 heart block, she underwent initial permanent pacemaker placement in 2004 with subsequent generator change in June 2013.  She underwent repeat diagnostic catheterization in June 2011, which showed less than 30% stenosis within the previously placed LAD stent and otherwise normal coronary arteries and normal LV function.  Stress testing in July 2018 showed a small apical inferior defect of mild severity without ischemia.  EF was 59%.  Most recent echo in June 2022 showed an EF of 55 to 60% with an RVSP of 31.4 mmHg, mild MR, moderate TR, moderate AI, and mild aortic sclerosis without stenosis.    More recently, in May 2023, she was seen in electrophysiology clinic with report of dyspnea concerning for angina.  She underwent Lexiscan PET/CT, which showed an EF of 56% at rest (61% with stress) with a small fixed apical inferior defect felt to be artifact in the setting of pacemaker.  Overall LV perfusion was normal without ischemia.  [name] was last  seen in cardiology clinic in late May 2023 at which time she reported some dyspnea with inclines, which was overall felt to be stable.  In light of recent nonischemic stress testing, medical therapy was continued.  Since her last visit, she has remained very active.  Her husband walk every day and she also participates in yoga up to 3 times a week.  Beginning about a month ago, she began experiencing intermittent "gurgling sensation" in the epigastric area that moves up and down her chest and sometimes down to her abdomen.  This typically occurs first thing in the morning, before lunch, and at some point in the evening.  The sensation is mildly uncomfortable but she is not really able to describe it beyond that.  There are no associated symptoms.  For the most part, symptoms occur at rest, but on the few occasions they have occurred either while she is out walking or at yoga class, symptoms have had no impact on her activity level, and she just keeps going about her business without stopping.  Symptoms do seem to improve with drinking water or eating, and she has also been taking some of her husband's Prilosec, though not on a consistent basis.  She has also taken Gaviscon on a few occasions with subsequent belching and resolution of symptoms.  Because of her cardiac history, she contacted our office today was placed on my schedule.  He is currently asymptomatic.  She does not experience dyspnea with routine activities (was her previous anginal equivalent) and has chronic, stable dyspnea with steep inclines.  She denies palpitations, PND, orthopnea, dizziness, syncope, edema, or early satiety.  Home Medications    Current Outpatient Medications  Medication Sig Dispense Refill   aspirin EC 81 MG tablet Take 81 mg by mouth daily.     atorvastatin (LIPITOR) 80 MG tablet Take 1 tablet by mouth once daily 90 tablet 3   carvedilol (COREG) 3.125 MG tablet Take 1 tablet (3.125 mg total) by mouth 2 (two) times daily  with a meal. 180 tablet 2   clobetasol ointment (TEMOVATE) 1.66 % Apply 1 application topically daily as needed (eczema).      clotrimazole-betamethasone (LOTRISONE) cream Apply 1 application topically 2 (two) times daily. 30 g 0   EPINEPHrine 0.3 mg/0.3 mL IJ SOAJ injection Inject 0.3 mLs (0.3 mg total) into the muscle once. 2 Device 1   ibuprofen (ADVIL) 200 MG tablet Take 2 tablets (400 mg total) by mouth every 8 (eight) hours as needed for moderate pain (with food). 30 tablet 0   levothyroxine (SYNTHROID) 88 MCG tablet Take 1 tablet (88 mcg total) by mouth daily. 90 tablet 3   losartan (COZAAR) 25 MG tablet Take 1 tablet (25 mg total) by mouth daily. Take an additional 12.'5mg'$  if BP > 140/80 90 tablet 1   pantoprazole (PROTONIX) 40 MG tablet Take 1 tablet (40 mg total) by mouth daily. 90 tablet 3  atorvastatin (LIPITOR) 80 MG tablet Take 1 tablet (80 mg total) by mouth daily. (Patient not taking: Reported on 06/22/2022) 90 tablet 4   fexofenadine (ALLEGRA) 180 MG tablet 1 tablet Swallow whole with water; do not take with fruit juices. (Patient not taking: Reported on 06/22/2022)     Current Facility-Administered Medications  Medication Dose Route Frequency Provider Last Rate Last Admin   triamcinolone acetonide (KENALOG) 10 MG/ML injection 10 mg  10 mg Other Once Landis Martins, DPM         Review of Systems    Intermittent "Gurgling sensation" in the center of her chest as outlined above.  She denies dyspnea, palpitations, PND, orthopnea, dizziness, syncope, edema, or early satiety.  All other systems reviewed and are otherwise negative except as noted above.    Physical Exam    VS:  BP 138/72 (BP Location: Left Arm, Patient Position: Sitting, Cuff Size: Normal)   Pulse 62   Ht '5\' 10"'$  (1.778 m)   Wt 159 lb 9.6 oz (72.4 kg)   LMP 10/17/1986   SpO2 99%   BMI 22.90 kg/m  , BMI Body mass index is 22.9 kg/m.     GEN: Well nourished, well developed, in no acute distress. HEENT:  normal. Neck: Supple, no JVD, carotid bruits, or masses. Cardiac: RRR, 2/6 systolic murmur loudest at the left lower sternal border.  No rubs or gallops. No clubbing, cyanosis, edema.  Radials 2+/PT 2+ and equal bilaterally.  Respiratory:  Respirations regular and unlabored, clear to auscultation bilaterally. GI: Soft, nontender, nondistended, BS + x 4. MS: no deformity or atrophy. Skin: warm and dry, no rash. Neuro:  Strength and sensation are intact. Psych: Normal affect.  Accessory Clinical Findings    ECG personally reviewed by me today -a sensed, V paced, 62- no acute changes.  Lab Results  Component Value Date   WBC 4.2 03/15/2022   HGB 12.9 03/15/2022   HCT 38.9 03/15/2022   MCV 88.6 03/15/2022   PLT 197.0 03/15/2022   Lab Results  Component Value Date   CREATININE 0.82 05/30/2022   BUN 17 05/30/2022   NA 137 05/30/2022   K 4.4 05/30/2022   CL 101 05/30/2022   CO2 31 05/30/2022   Lab Results  Component Value Date   ALT 25 05/30/2022   AST 18 05/30/2022   ALKPHOS 69 05/30/2022   BILITOT 0.8 05/30/2022   Lab Results  Component Value Date   CHOL 132 03/15/2022   HDL 49.50 03/15/2022   LDLCALC 68 03/15/2022   TRIG 72.0 03/15/2022   CHOLHDL 3 03/15/2022    Lab Results  Component Value Date   HGBA1C 6.3 05/30/2022    Assessment & Plan    1.  Precordial chest discomfort/CAD: Status post remote bare-metal stenting of the LAD in 2003 with patent stent on catheterization in 2011.  Recent low risk Lexiscan PET/CT in May 2023 in the setting of some dyspnea on exertion, which has overall been stable.  Over the past month, she has been experiencing what she describes as a "gurgling sensation" that occurs in the epigastrium, up and down her chest and sometimes into her abdomen.  This has been occurring 2-3 times a day, typically at rest, and seems to improve with drinking water, eating something, and potentially after recently taking her husband's Prilosec on a few  occasions.  She has also taken Gaviscon on a few occasions with subsequent belching and resolution of symptoms.  She has remained very active, walking regularly  and also participating in yoga up to 3 times per week, without symptoms or limitations, outside of stable dyspnea on exertion with steep inclines.  Current symptoms are atypical.  ECG is V paced.  She is scheduled to see GI in roughly 2 weeks.  I have provided her with a prescription for Protonix 40 mg once a day and offered reassurance from a cardiac standpoint, as no further ischemic evaluation is warranted at this time.  2.  GERD: See 1.  Adding PPI.  Has follow-up with GI in approximately 2 weeks.  3.  Essential hypertension: Blood pressure on the higher end today but has trended lower more recently.  Continue current regimen, which includes beta-blocker and ARB therapy.  4.  Hyperlipidemia: LDL of 68 in August with normal LFTs in November.  Continue statin therapy.  5.  Moderate aortic insufficiency: Patient with valvular heart disease including mild MR, moderate TR, moderate AI, and mild aortic sclerosis by echo in June 2022.  Per previous notes, plan for follow-up echo in 2024.  6.  Mobitz 2 heart block: Status post permanent pacemaker in 2004 with GEN change in 2013.  She is followed by electrophysiology.  A sensed, V paced today.  7.  Disposition: Patient will follow-up with Dr. Percival Spanish as planned next year.  She is considering transitioning her care to the [place] area now that she and her husband have moved here.  Madeline Hodgkins, NP 06/22/2022, 12:56 PM

## 2022-06-27 ENCOUNTER — Other Ambulatory Visit: Payer: Self-pay | Admitting: *Deleted

## 2022-06-27 ENCOUNTER — Inpatient Hospital Stay
Admission: RE | Admit: 2022-06-27 | Discharge: 2022-06-27 | Disposition: A | Discharge status: Home / Self Care | Payer: Self-pay | Source: Ambulatory Visit | Attending: *Deleted | Admitting: *Deleted

## 2022-06-27 DIAGNOSIS — Z1231 Encounter for screening mammogram for malignant neoplasm of breast: Secondary | ICD-10-CM

## 2022-06-29 NOTE — Progress Notes (Signed)
Remote pacemaker transmission.   

## 2022-07-03 NOTE — Progress Notes (Signed)
07/04/2022 [facility] [id number] 1945/07/26  Referring provider: Burnard Hawthorne, FNP Primary GI doctor: Dr. Carlean Purl  ASSESSMENT AND PLAN:   Non cardiac chest pain x 1 month Some intermittent dysphagia/odynophagia with eating quickly Stopped xanax week prior to pain starting No NSAIDS, on wine daily, no alarm symptoms.  Has been on PPI daily for 1 week with resolution of symptoms Check labs for lipase, CBC, CMET With patient feeling better and normal exam discussed EGD versus barium swallow, will proceed with barium swallow to look for dysmotility, stenosis, esophagitis, GERD, if abnormal will schedule for EGD If returns or any worsening symptoms schedule EGD Continue to protonix 40 mg once daily before food, stop wine, no NSAIDS Follow up 3 months  Screen for colon cancer 10/15/2013 colonoscopy with Dr. Carlean Purl for screening purposes good prep with Suprep, unremarkable recall 10/2023.  Ischemic cardiomyopathy, CAD, with moderate AR, mobitz 2 s/p pacemaker Echo 01/10/2021 TR moderate, AR moderate, no AS, EF 55-60% No SOB, no swelling    Patient Care Team: Burnard Hawthorne, FNP as PCP - General (Family Medicine) Vickie Epley, MD as PCP - Electrophysiology (Cardiology) Minus Breeding, MD as PCP - Cardiology (Cardiology) Gatha Mayer, MD as Consulting Physician (Gastroenterology) Minus Breeding, MD as Consulting Physician (Cardiology)  HISTORY OF PRESENT ILLNESS: 75 y.o. female with a past medical history of ischemic cardiomyopathy, CAD, AR, Mobitz type II status post pacemaker, dyslipidemia and others listed below presents for evaluation of GERD.   10/15/2013 colonoscopy with Dr. Carlean Purl for screening purposes good prep with Suprep, unremarkable recall 10/2023. 11/29/2018 CT abdomen pelvis with contrast diarrhea, 15 pound weight loss no acute process, right nephrolithiasis. 03/15/2022 labs reviewed show no anemia, normal kidney and liver  Patient states  if she eats very quickly would feel it would be slow moving and be painful.  For the past month she has had mid sternum chest pain with some radiation to her back .  Denies nausea, vomiting.  Has had GERD into her throat.  No AB pain. No melena, no hematochezia.  No diarrhea, constipation, seems well formed stools, had some alternating stools during covid.  [place] and water helped.  She states she has been on protonix 40 mg for just a week and feels complete resolution of her symptoms.  She states she tapered off alprazolam a few days before the pain started.  Husband states last 2 years have been stressful, sold their house and moved into retirement community.  No weight loss Getting ready to go to [facility] for a month on the 27th.   As needed ibuprofen. No NSAIDS. She has never smoked.  Drinks a class of red wine daily or whiskey sour.  Never smoker.  No steroid inhalers.   She  reports that she has never smoked. She has never used smokeless tobacco. She reports current alcohol use. She reports that she does not use drugs.  Current Medications:   Current Outpatient Medications (Endocrine & Metabolic):    levothyroxine (SYNTHROID) 88 MCG tablet, Take 1 tablet by mouth once daily  Current Facility-Administered Medications (Endocrine & Metabolic):    triamcinolone acetonide (KENALOG) 10 MG/ML injection 10 mg  Current Outpatient Medications (Cardiovascular):    atorvastatin (LIPITOR) 80 MG tablet, Take 1 tablet by mouth once daily   atorvastatin (LIPITOR) 80 MG tablet, Take 1 tablet (80 mg total) by mouth daily.   carvedilol (COREG) 3.125 MG tablet, Take 1 tablet (3.125 mg total) by mouth 2 (two) times daily  with a meal.   EPINEPHrine 0.3 mg/0.3 mL IJ SOAJ injection, Inject 0.3 mLs (0.3 mg total) into the muscle once.   losartan (COZAAR) 25 MG tablet, Take 1 tablet (25 mg total) by mouth daily. Take an additional 12.'5mg'$  if BP > 140/80   Current Outpatient Medications (Respiratory):     fexofenadine (ALLEGRA) 180 MG tablet,    Current Outpatient Medications (Analgesics):    aspirin EC 81 MG tablet, Take 81 mg by mouth daily.   ibuprofen (ADVIL) 200 MG tablet, Take 2 tablets (400 mg total) by mouth every 8 (eight) hours as needed for moderate pain (with food).     Current Outpatient Medications (Other):    clobetasol ointment (TEMOVATE) 6.16 %, Apply 1 application topically daily as needed (eczema).    clotrimazole-betamethasone (LOTRISONE) cream, Apply 1 application topically 2 (two) times daily.   pantoprazole (PROTONIX) 40 MG tablet, Take 1 tablet (40 mg total) by mouth daily.   Medical History:  Past Medical History:  Diagnosis Date   Aortic insufficiency    a. 12/2020 Echo: EF 55-60%, no rwma, nl RV fxn, RVSP 31.61mHg, mild MR, mod TR, mild Ao sclerosis. Mod AI. Mod dil PA.   Aortic valve sclerosis    Blood transfusion without reported diagnosis    BRONCHITIS    CAD (coronary artery disease)    a. 08/2001 PCI: LAD [id number] Zeta BMS); b. 12/2009 Cath: LM nl, LAD <30ISR, RI nl, LCX nl, RCA nl, EF nl; c. 01/2017 MV: small apical inf defect w/o ischemia. EF 59%; d. 11/2021 Lexi PET/CT: EF 56% (61% w/ stress), small fixed apical/inf defect 2/2 PPM (artifact). No ischemia-->low risk.   Cardiac pacemaker in situ    a. 2004 s/p PPM ([facility]); b. 12/2011 Gen Change ([facility]): MD Adapta L ADDRL1 DC PPM (ser # [id number]   Cataract    DYSPNEA    HLD (hyperlipidemia)    Hypothyroidism    Mobitz type 2 second degree heart block    a. 2004 s/p PPM ([facility]ie); b. 12/2011 Gen Change ([facility]): MD Adapta L ADDRL1 DC PPM (ser # [id number]   Osteopenia    Tricuspid regurgitation    Allergies:  Allergies  Allergen Reactions   Bee Venom Anaphylaxis   Codeine Other (See Comments) and Rash    REACTION: Passed  out, facial swelling and hives   Pneumovax 23 [Pneumococcal Vac Polyvalent] Swelling    Local swelling and pain   Lisinopril     COUGH   Hornet Venom Hives    Hives  over entire body   Sulfa Antibiotics Hives   Sulfonamide Derivatives Hives     Surgical History:  She  has a past surgical history that includes Pacemaker insertion (2004, 12/22/11); Total abdominal hysterectomy w/ bilateral salpingoophorectomy; Myomectomy; Cardiac catheterization; Cystoscopy with retrograde pyelogram, ureteroscopy and stent placement (08/19/2012); and pacemaker generator change (N/A, 12/22/2011). Family History:  Her family history includes Breast cancer in her mother; Heart attack in her mother; Prostate cancer in her father.  REVIEW OF SYSTEMS  : All other systems reviewed and negative except where noted in the History of Present Illness.  PHYSICAL EXAM: BP 128/72   Pulse 65   Ht '5\' 10"'$  (1.778 m)   Wt 164 lb 8 oz (74.6 kg)   LMP 10/17/1986   BMI 23.60 kg/m  General:   Pleasant, well developed female in no acute distress Head:   Normocephalic and atraumatic. Eyes:  sclerae anicteric,conjunctive pink  Heart:   regular rate and rhythm, holosystolic  murmur Pulm:  Clear anteriorly; no wheezing Abdomen:   Soft, Obese AB, Active bowel sounds. No tenderness . Without guarding and Without rebound, No organomegaly appreciated. Rectal: Not evaluated Extremities:  Without edema. Msk: Symmetrical without gross deformities. Peripheral pulses intact.  Neurologic:  Alert and  oriented x4;  No focal deficits.  Skin:   Dry and intact without significant lesions or rashes. Psychiatric:  Cooperative. Normal mood and affect.  RELEVANT LABS AND IMAGING: CBC    Component Value Date/Time   WBC 4.2 03/15/2022 0811   RBC 4.39 03/15/2022 0811   HGB 12.9 03/15/2022 0811   HCT 38.9 03/15/2022 0811   PLT 197.0 03/15/2022 0811   MCV 88.6 03/15/2022 0811   MCH 29.7 08/19/2012 0527   MCHC 33.2 03/15/2022 0811   RDW 15.0 03/15/2022 0811   LYMPHSABS 1.2 03/15/2022 0811   MONOABS 0.4 03/15/2022 0811   EOSABS 0.2 03/15/2022 0811   BASOSABS 0.0 03/15/2022 0811    CMP     Component Value  Date/Time   NA 137 05/30/2022 0950   K 4.4 05/30/2022 0950   CL 101 05/30/2022 0950   CO2 31 05/30/2022 0950   GLUCOSE 88 05/30/2022 0950   BUN 17 05/30/2022 0950   CREATININE 0.82 05/30/2022 0950   CREATININE 0.73 05/27/2021 1535   CALCIUM 8.8 05/30/2022 0950   PROT 6.8 05/30/2022 0950   PROT 6.9 01/12/2017 1544   ALBUMIN 4.1 05/30/2022 0950   ALBUMIN 4.3 01/12/2017 1544   AST 18 05/30/2022 0950   ALT 25 05/30/2022 0950   ALKPHOS 69 05/30/2022 0950   BILITOT 0.8 05/30/2022 0950   BILITOT 0.8 01/12/2017 1544   GFRNONAA 75 (L) 08/19/2012 0527   GFRAA 87 (L) 08/19/2012 0527     Vladimir Crofts, PA-C 2:01 PM

## 2022-07-04 ENCOUNTER — Other Ambulatory Visit (INDEPENDENT_AMBULATORY_CARE_PROVIDER_SITE_OTHER): Payer: Medicare PPO

## 2022-07-04 ENCOUNTER — Encounter: Payer: Self-pay | Admitting: Physician Assistant

## 2022-07-04 ENCOUNTER — Ambulatory Visit: Payer: Medicare PPO | Admitting: Physician Assistant

## 2022-07-04 VITALS — BP 128/72 | HR 65 | Ht 70.0 in | Wt 164.5 lb

## 2022-07-04 DIAGNOSIS — K219 Gastro-esophageal reflux disease without esophagitis: Secondary | ICD-10-CM

## 2022-07-04 DIAGNOSIS — I255 Ischemic cardiomyopathy: Secondary | ICD-10-CM | POA: Diagnosis not present

## 2022-07-04 DIAGNOSIS — R1319 Other dysphagia: Secondary | ICD-10-CM

## 2022-07-04 DIAGNOSIS — Z1211 Encounter for screening for malignant neoplasm of colon: Secondary | ICD-10-CM | POA: Diagnosis not present

## 2022-07-04 DIAGNOSIS — I351 Nonrheumatic aortic (valve) insufficiency: Secondary | ICD-10-CM

## 2022-07-04 LAB — COMPREHENSIVE METABOLIC PANEL
ALT: 17 U/L (ref 0–35)
AST: 14 U/L (ref 0–37)
Albumin: 4.1 g/dL (ref 3.5–5.2)
Alkaline Phosphatase: 71 U/L (ref 39–117)
BUN: 22 mg/dL (ref 6–23)
CO2: 29 mEq/L (ref 19–32)
Calcium: 9 mg/dL (ref 8.4–10.5)
Chloride: 102 mEq/L (ref 96–112)
Creatinine, Ser: 0.84 mg/dL (ref 0.40–1.20)
GFR: 67.72 mL/min (ref >60.00)
Glucose, Bld: 84 mg/dL (ref 70–99)
Potassium: 3.8 mEq/L (ref 3.5–5.1)
Sodium: 137 mEq/L (ref 135–145)
Total Bilirubin: 0.5 mg/dL (ref 0.2–1.2)
Total Protein: 7.2 g/dL (ref 6.0–8.3)

## 2022-07-04 LAB — CBC WITH DIFFERENTIAL/PLATELET
Basophils Absolute: 0.1 10*3/uL (ref 0.0–0.1)
Basophils Relative: 0.9 % (ref 0.0–3.0)
Eosinophils Absolute: 0.4 10*3/uL (ref 0.0–0.7)
Eosinophils Relative: 5.5 % — ABNORMAL HIGH (ref 0.0–5.0)
HCT: 41.2 % (ref 36.0–46.0)
Hemoglobin: 13.8 g/dL (ref 12.0–15.0)
Lymphocytes Relative: 26.1 % (ref 12.0–46.0)
Lymphs Abs: 1.9 10*3/uL (ref 0.7–4.0)
MCHC: 33.6 g/dL (ref 30.0–36.0)
MCV: 87.2 fl (ref 78.0–100.0)
Monocytes Absolute: 0.6 10*3/uL (ref 0.1–1.0)
Monocytes Relative: 9 % (ref 3.0–12.0)
Neutro Abs: 4.2 10*3/uL (ref 1.4–7.7)
Neutrophils Relative %: 58.5 % (ref 43.0–77.0)
Platelets: 266 10*3/uL (ref 150.0–400.0)
RBC: 4.73 Mil/uL (ref 3.87–5.11)
RDW: 14 % (ref 11.5–15.5)
WBC: 7.1 10*3/uL (ref 4.0–10.5)

## 2022-07-04 LAB — LIPASE: Lipase: 27 U/L (ref 11.0–59.0)

## 2022-07-04 NOTE — Patient Instructions (Addendum)
_______________________________________________________  If you are age 75 or older, your body mass index should be between 23-30. Your Body mass index is 23.6 kg/m. If this is out of the aforementioned range listed, please consider follow up with your Primary Care Provider.  If you are age 59 or younger, your body mass index should be between 19-25. Your Body mass index is 23.6 kg/m. If this is out of the aformentioned range listed, please consider follow up with your Primary Care Provider.   ________________________________________________________  The [facility] providers would like to encourage you to use [facility] to communicate with providers for non-urgent requests or questions.  Due to long hold times on the telephone, sending your provider a message by [facility] may be a faster and more efficient way to get a response.  Please allow 48 business hours for a response.  Please remember that this is for non-urgent requests.  _______________________________________________________  Your provider has requested that you go to the basement level for lab work before leaving today. Press "B" on the elevator. The lab is located at the first door on the left as you exit the elevator.  Please follow up in 3 months. Give [place] a call at [phone] to schedule an appointment.   You have been scheduled for a Barium Esophogram at [facility] (1st floor of the hospital) on 07-11-2022 at 10am. Please arrive 30 minutes prior to your appointment for registration. Make certain not to have anything to eat or drink 3 hours prior to your test. If you need to reschedule for any reason, please contact radiology at [phone] to do so. __________________________________________________________________ A barium swallow is an examination that concentrates on views of the esophagus. This tends to be a double contrast exam (barium and two liquids which, when combined, create a gas to distend the wall of the  oesophagus) or single contrast (non-ionic iodine based). The study is usually tailored to your symptoms so a good history is essential. Attention is paid during the study to the form, structure and configuration of the esophagus, looking for functional disorders (such as aspiration, dysphagia, achalasia, motility and reflux) EXAMINATION You may be asked to change into a gown, depending on the type of swallow being performed. A radiologist and radiographer will perform the procedure. The radiologist will advise you of the type of contrast selected for your procedure and direct you during the exam. You will be asked to stand, sit or lie in several different positions and to hold a small amount of fluid in your mouth before being asked to swallow while the imaging is performed .In some instances you may be asked to swallow barium coated marshmallows to assess the motility of a solid food bolus. The exam can be recorded as a digital or video fluoroscopy procedure. POST PROCEDURE It will take 1-2 days for the barium to pass through your system. To facilitate this, it is important, unless otherwise directed, to increase your fluids for the next 24-48hrs and to resume your normal diet.  This test typically takes about 30 minutes to perform. __________________________________________________________________________________  Please take your proton pump inhibitor medication, pantoprazole 40 mg daily for 8-12 weeks at least  Please take this medication 30 minutes to 1 hour before meals- this makes it more effective.  Avoid spicy and acidic foods Avoid fatty foods Limit your intake of coffee, tea, alcohol, and carbonated drinks Work to maintain a healthy weight Keep the head of the bed elevated at least 3 inches with blocks or a  wedge pillow if you are having any nighttime symptoms Stay upright for 2 hours after eating Avoid meals and snacks three to four hours before bedtime   Esophagitis  Esophagitis  is inflammation of the esophagus. The esophagus is the tube that carries food from the mouth to the stomach. Esophagitis can cause soreness or pain in the esophagus. This condition can make it difficult and painful to swallow. What are the causes? Most causes of esophagitis are not serious. Common causes of this condition include: Gastroesophageal reflux disease (GERD). This is when stomach contents move back up into the esophagus (reflux). Repeated vomiting. An allergic reaction, especially caused by food allergies (eosinophilic esophagitis). Injury to the esophagus by swallowing large pills with or without water, or swallowing certain types of medicines. Swallowing harmful chemicals, such as household cleaning products. Drinking a lot of alcohol. An infection of the esophagus. This most often occurs in people who have a weakened immune system. Radiation or chemotherapy treatment for cancer. Certain diseases such as sarcoidosis, Crohn's disease, and scleroderma. What are the signs or symptoms? Symptoms of this condition include: Difficult or painful swallowing. Pain with swallowing acidic liquids, such as citrus juices. You may also have pain when you burp. Chest pain and difficulty breathing. Nausea and vomiting. Pain in the abdomen. Weight loss. Ulcers in the mouth and white patches in the mouth (candidiasis). Fever. Coughing up blood or vomiting blood. Stool that is black, tarry, or bright red. How is this diagnosed? This condition may be diagnosed based on your medical history and a physical exam. You may also have other tests, including: A test to examine your esophagus and stomach with a small flexible tube with a camera (endoscopy). A test that measures the acidity level in your esophagus. A test that measures how much pressure is on your esophagus. A barium swallow or modified barium swallow to show the shape, size, and functioning of your esophagus. Allergy tests. How is this  treated? Treatment for this condition depends on the cause of your esophagitis. In some cases, steroids or other medicines may be given to help relieve your symptoms or to treat the underlying cause of your condition. You may have to make some lifestyle changes, such as: Avoiding alcohol. Quitting any products that contain nicotine or tobacco. These products include cigarettes, chewing tobacco, and vaping devices, such as e-cigarettes. If you need help quitting, ask your health care provider. Changing your diet. Exercising. Changing your sleep habits and your sleep environment. Follow these instructions at home: Medicines Take over-the-counter and prescription medicines only as told by your health care provider. Do not take aspirin, ibuprofen, or other NSAIDs unless your health care provider told you to do so. If you have trouble taking pills: Use a pill splitter to decrease the size of the pill. This will decrease the chance of the pill getting stuck or injuring your esophagus. Drink water after you take a pill. Eating and drinking  Avoid foods and drinks that seem to make your symptoms worse. Follow a diet as recommended by your health care provider. This may involve avoiding foods and drinks such as: Coffee and tea, with or without caffeine. Drinks that contain alcohol. Energy drinks and sports drinks. Carbonated drinks or sodas. Chocolate and cocoa. Peppermint and mint flavorings. Garlic and onions. Horseradish. Spicy and acidic foods, including peppers, chili powder, curry powder, vinegar, hot sauces, and barbecue sauce. Citrus fruit juices and citrus fruits, such as oranges, lemons, and limes. Tomato-based foods, such as red sauce,  chili, salsa, and pizza with red sauce. Fried and fatty foods, such as donuts, french fries, potato chips, and high-fat dressings. High-fat meats, such as hot dogs and fatty cuts of red and white meats, such as rib eye steak, sausage, ham, and  bacon. High-fat dairy items, such as whole milk, butter, and cream cheese. Lifestyle Eat small, frequent meals instead of large meals. Avoid drinking large amounts of liquid with your meals. Avoid eating meals during the 2-3 hours before bedtime. Avoid lying down right after you eat. Do not exercise right after you eat. Do not use any products that contain nicotine or tobacco. These products include cigarettes, chewing tobacco, and vaping devices, such as e-cigarettes. If you need help quitting, ask your health care provider. General instructions  Pay attention to any changes in your symptoms. Let your health care provider know about them. Wear loose-fitting clothing. Do not wear anything tight around your waist that causes pressure on your abdomen. Raise (elevate) the head of your bed about 6 inches (15 cm). You may need to use a wedge to do this. Try relaxation strategies such as yoga, deep breathing, or meditation to manage stress. If you need help reducing stress, ask your health care provider. If you are overweight, reduce your weight to an amount that is healthy for you. Ask your health care provider for guidance about a safe weight loss goal. Keep all follow-up visits. This is important. Contact a health care provider if: You have new symptoms. You have unexplained weight loss. You have difficulty swallowing, or it hurts to swallow. You have wheezing or a cough that does not go away. Your symptoms do not improve with treatment. You have frequent heartburn for more than two weeks. Get help right away if: You have sudden severe pain in your arms, neck, jaw, teeth, or back. You suddenly feel sweaty, dizzy, or light-headed. You have chest pain or shortness of breath. You vomit and the vomit is green, yellow, or black, or it looks like blood or coffee grounds. Your stool is red, bloody, or black. You have a fever. You cannot swallow, drink, or eat. These symptoms may represent a  serious problem that is an emergency. Do not wait to see if the symptoms will go away. Get medical help right away. Call your local emergency services (911 in the [place] Do not drive yourself to the hospital. Summary Esophagitis is inflammation of the esophagus. Most causes of esophagitis are not serious. Follow your health care provider's instructions about eating and drinking. Contact a health care provider if you have new symptoms, have weight loss, or coughing that does not stop. Get help right away if you have severe pain in the arms, neck, jaw, teeth, or back, or if you have chest pain, shortness of breath, or fever. This information is not intended to replace advice given to you by your health care provider. Make sure you discuss any questions you have with your health care provider. Document Revised: 01/12/2020 Document Reviewed: 01/12/2020 Elsevier Patient Education  [place].

## 2022-07-11 ENCOUNTER — Ambulatory Visit (HOSPITAL_COMMUNITY)
Admission: RE | Admit: 2022-07-11 | Discharge: 2022-07-11 | Disposition: A | Discharge status: Home / Self Care | Payer: Medicare PPO | Source: Ambulatory Visit | Attending: Physician Assistant | Admitting: Physician Assistant

## 2022-07-11 DIAGNOSIS — K219 Gastro-esophageal reflux disease without esophagitis: Secondary | ICD-10-CM | POA: Diagnosis not present

## 2022-07-11 DIAGNOSIS — R1319 Other dysphagia: Secondary | ICD-10-CM

## 2022-07-11 MED ORDER — PANTOPRAZOLE SODIUM 40 MG PO TBEC: 40.0000 mg | DELAYED_RELEASE_TABLET | Freq: Two times a day (BID) | ORAL | 0 refills | Status: DC

## 2022-07-11 NOTE — Addendum Note (Signed)
Addended by: Vicie Mutters on: 07/11/2022 12:49 PM   Modules accepted: Orders

## 2022-08-30 DIAGNOSIS — L821 Other seborrheic keratosis: Secondary | ICD-10-CM | POA: Diagnosis not present

## 2022-08-30 DIAGNOSIS — L218 Other seborrheic dermatitis: Secondary | ICD-10-CM | POA: Diagnosis not present

## 2022-08-30 DIAGNOSIS — L400 Psoriasis vulgaris: Secondary | ICD-10-CM | POA: Diagnosis not present

## 2022-08-30 DIAGNOSIS — L820 Inflamed seborrheic keratosis: Secondary | ICD-10-CM | POA: Diagnosis not present

## 2022-08-30 DIAGNOSIS — L570 Actinic keratosis: Secondary | ICD-10-CM | POA: Diagnosis not present

## 2022-09-01 ENCOUNTER — Other Ambulatory Visit: Payer: Self-pay | Admitting: Family

## 2022-09-01 DIAGNOSIS — I10 Essential (primary) hypertension: Secondary | ICD-10-CM

## 2022-09-04 ENCOUNTER — Telehealth: Payer: Self-pay | Admitting: Cardiology

## 2022-09-04 NOTE — Telephone Encounter (Signed)
Left message to call back  

## 2022-09-04 NOTE — Telephone Encounter (Signed)
Pt c/o medication issue:  1. Name of Medication: pantoprazole (PROTONIX) 40 MG tablet  2. How are you currently taking this medication (dosage and times per day)? 1 tablet daily  3. Are you having a reaction (difficulty breathing--STAT)? no  4. What is your medication issue? Patient states she is completely out of medication and is not sure if the prescription needs to be changed back to 1 tablet daily. She says she has gone back to take it once daily and is doing okay. She says she will be taking her dog to the dog wash and will need a call back after 4 pm.

## 2022-09-05 NOTE — Telephone Encounter (Signed)
Reviewed the patient's chart. She was started on Protonix 40 mg once daily on 06/22/22 for GERD. She did see [facility] on 07/04/22 and on 07/11/22, a prescription was sent to the pharmacy under Vicie Mutters, PA for protonix 40 mg BID.  The patient the she forgot she had seen GI. I have asked her to follow up with the GI department on how she should currently be taking protonix and have them refill this for her.  The patient voices understanding and is agreeable.

## 2022-09-06 ENCOUNTER — Ambulatory Visit (INDEPENDENT_AMBULATORY_CARE_PROVIDER_SITE_OTHER): Payer: Medicare PPO

## 2022-09-06 DIAGNOSIS — I441 Atrioventricular block, second degree: Secondary | ICD-10-CM

## 2022-09-08 LAB — CUP PACEART REMOTE DEVICE CHECK
Battery Impedance: 2044 Ohm
Battery Remaining Longevity: 34 mo
Battery Voltage: 2.75 V
Brady Statistic AP VP Percent: 27 %
Brady Statistic AP VS Percent: 0 %
Brady Statistic AS VP Percent: 73 %
Brady Statistic AS VS Percent: 0 %
Date Time Interrogation Session: 20240223112824
Implantable Lead Connection Status: 753985
Implantable Lead Connection Status: 753985
Implantable Lead Implant Date: 20040907
Implantable Lead Implant Date: 20040907
Implantable Lead Location: 753859
Implantable Lead Location: 753860
Implantable Lead Model: 5076
Implantable Lead Model: 5076
Implantable Pulse Generator Implant Date: 20130607
Lead Channel Impedance Value: 445 Ohm
Lead Channel Impedance Value: 701 Ohm
Lead Channel Pacing Threshold Amplitude: 0.625 V
Lead Channel Pacing Threshold Amplitude: 0.75 V
Lead Channel Pacing Threshold Pulse Width: 0.4 ms
Lead Channel Pacing Threshold Pulse Width: 0.4 ms
Lead Channel Setting Pacing Amplitude: 2 V
Lead Channel Setting Pacing Amplitude: 2.5 V
Lead Channel Setting Pacing Pulse Width: 0.4 ms
Lead Channel Setting Sensing Sensitivity: 2 mV
Zone Setting Status: 755011
Zone Setting Status: 755011

## 2022-10-05 NOTE — Progress Notes (Signed)
Remote pacemaker transmission.   

## 2022-10-06 ENCOUNTER — Other Ambulatory Visit: Payer: Self-pay | Admitting: Family

## 2022-10-09 ENCOUNTER — Telehealth: Payer: Self-pay | Admitting: Family

## 2022-10-09 MED ORDER — LEVOTHYROXINE SODIUM 88 MCG PO TABS: 88.0000 ug | ORAL_TABLET | Freq: Every day | ORAL | 0 refills | Status: DC

## 2022-10-09 NOTE — Telephone Encounter (Signed)
RX sent and pt is aware. 

## 2022-10-09 NOTE — Telephone Encounter (Signed)
Pt need a refill on levothyroxine sent to [address]

## 2022-10-13 DIAGNOSIS — M67432 Ganglion, left wrist: Secondary | ICD-10-CM | POA: Diagnosis not present

## 2022-11-07 NOTE — Progress Notes (Unsigned)
Cardiology Office Note Date:  11/09/2022  Patient ID:  Madeline, Schaefer [birth year], MRN [id number] PCP:  Allegra Grana, FNP  Cardiologist:  Rollene Rotunda, MD Electrophysiologist: Dr. Johney Frame > Lanier Prude, MD    Chief Complaint: 1 year device follow-up  History of Present Illness: Madeline Schaefer is a 77 y.o. female with PMH notable for CAD s/p remote LAD stenting, Mobitz II s/p PPM; seen today for Lanier Prude, MD for routine electrophysiology followup.  Last saw Dr. Lalla Brothers 10/2021, was having some exertional CP and SOB especially with vigorous exercise - similar symptoms to prior PCI. Ordered PET stress  Today, patient states that she feels very well. She almost forgot about appt today and was rushing to arrive on time and thinks that is why her BP is elevated. She does not regularly check BP at home. Has husband's cuffs with her.  She has some SOB when walking uphill quickly. No CP or dizziness. If she slows her walking speed, SOB resolves.     Device Information: MDT dual chamber PPM, imp 03/2003; dx AV block Gen change 2013  Past Medical History:  Diagnosis Date   Aortic insufficiency    a. 12/2020 Echo: EF 55-60%, no rwma, nl RV fxn, RVSP 31.47mmHg, mild MR, mod TR, mild Ao sclerosis. Mod AI. Mod dil PA.   Aortic valve sclerosis    Blood transfusion without reported diagnosis    BRONCHITIS    CAD (coronary artery disease)    a. 08/2001 PCI: LAD 30m (2.75x13 Zeta BMS); b. 12/2009 Cath: LM nl, LAD <30ISR, RI nl, LCX nl, RCA nl, EF nl; c. 01/2017 MV: small apical inf defect w/o ischemia. EF 59%; d. 11/2021 Lexi PET/CT: EF 56% (61% w/ stress), small fixed apical/inf defect 2/2 PPM (artifact). No ischemia-->low risk.   Cardiac pacemaker in situ    a. 2004 s/p PPM (Brodie); b. 12/2011 Gen Change (Allred): MD Adapta L ADDRL1 DC PPM (ser # [id number]).   Cataract    DYSPNEA    HLD (hyperlipidemia)    Hypothyroidism    Mobitz type 2 second degree heart block    a.  2004 s/p PPM (Brodie); b. 12/2011 Gen Change (Allred): MD Adapta L ADDRL1 DC PPM (ser # [id number]).   Osteopenia    Tricuspid regurgitation     Past Surgical History:  Procedure Laterality Date   CARDIAC CATHETERIZATION     CYSTOSCOPY WITH RETROGRADE PYELOGRAM, URETEROSCOPY AND STENT PLACEMENT  08/19/2012   Procedure: CYSTOSCOPY WITH RETROGRADE PYELOGRAM, URETEROSCOPY AND STENT PLACEMENT;  Surgeon: Milford Cage, MD;  Location: [facility];  Service: Urology;  Laterality: Right;   MYOMECTOMY     PACEMAKER GENERATOR CHANGE N/A 12/22/2011   Procedure: PACEMAKER GENERATOR CHANGE;  Surgeon: Hillis Range, MD;  Location: [facility] CATH LAB;  Service: Cardiovascular;  Laterality: N/A;   PACEMAKER INSERTION  2004, 12/22/11   initial implant by Dr Juanda Chance, Generator change (MDT Adapta L) by Dr Johney Frame 6/13   TOTAL ABDOMINAL HYSTERECTOMY W/ BILATERAL SALPINGOOPHORECTOMY      Current Outpatient Medications  Medication Instructions   aspirin EC 81 mg, Daily   atorvastatin (LIPITOR) 80 MG tablet Take 1 tablet by mouth once daily   atorvastatin (LIPITOR) 80 mg, Oral, Daily   carvedilol (COREG) 3.125 mg, Oral, 2 times daily with meals   clobetasol ointment (TEMOVATE) 0.05 % 1 application , Topical, Daily PRN   clotrimazole-betamethasone (LOTRISONE) cream 1 application , Topical, 2 times daily   EPINEPHrine (EPI-PEN)  0.3 mg, Intramuscular,  Once   fexofenadine (ALLEGRA) 180 MG tablet    ibuprofen (ADVIL) 400 mg, Oral, Every 8 hours PRN   levothyroxine (SYNTHROID) 88 mcg, Oral, Daily   losartan (COZAAR) 25 MG tablet TAKE 1 TABLET BY MOUTH ONCE DAILY -  TAKE  AN  ADDITIONAL  1/2  TAB  IF  BP  GREATER  THAN  140/80   pantoprazole (PROTONIX) 40 mg, Oral, 2 times daily before meals    Social History:  The patient  reports that she has never smoked. She has never used smokeless tobacco. She reports current alcohol use. She reports that she does not use drugs.   Family History:  The patient's  family history includes Breast cancer in her mother; Heart attack in her mother; Prostate cancer in her father.  ROS:  Please see the history of present illness. All other systems are reviewed and otherwise negative.   PHYSICAL EXAM:  VS:  BP (!) 152/70   Pulse 61   Ht 5\' 10"  (1.778 m)   Wt 160 lb (72.6 kg)   LMP 10/17/1986   SpO2 99%   BMI 22.96 kg/m  BMI: Body mass index is 22.96 kg/m.  Vitals:   11/09/22 1001 11/09/22 1030  BP: (!) 160/90 (!) 152/70  Pulse: 61   Height: 5\' 10"  (1.778 m)   Weight: 160 lb (72.6 kg)   SpO2: 99%   BMI (Calculated): 22.96     GEN- The patient is well appearing, alert and oriented x 3 today.   Lungs- Clear to ausculation bilaterally, normal work of breathing.  Heart- Regular rate and rhythm, no murmurs, rubs or gallops Extremities- No peripheral edema, warm, dry Skin-  device pocket well-healed     Device interrogation done today and reviewed by myself:  Battery good Lead thresholds, impedence, sensing stable  One brief SVT episode One brief NSVT episode Turned on RR  No other changes made today  Dependent  EKG is ordered. Personal review of EKG from today shows: Intermittent A-pace, V-paced, rate 61bpm  Recent Labs: 03/15/2022: TSH 1.49 07/04/2022: ALT 17; BUN 22; Creatinine, Ser 0.84; Hemoglobin 13.8; Platelets 266.0; Potassium 3.8; Sodium 137  03/15/2022: Cholesterol 132; HDL 49.50; LDL Cholesterol 68; Total CHOL/HDL Ratio 3; Triglycerides 72.0; VLDL 14.4   CrCl cannot be calculated (Patient's most recent lab result is older than the maximum 21 days allowed.).   Wt Readings from Last 3 Encounters:  11/09/22 160 lb (72.6 kg)  07/04/22 164 lb 8 oz (74.6 kg)  06/22/22 159 lb 9.6 oz (72.4 kg)     Additional studies reviewed include: Previous EP, cardiology notes.   NM Pet CT Cardiac, 11/22/2021 -There is a small fixed apical inferior defect which appears to be artifact (suspect pacemaker related). The coronary flow reserve in  this region is 1.88 which is close to normal. Overall, LV perfusion is normal. There is no evidence of ischemia. There is no evidence of infarction.   Rest left ventricular function is normal. Rest EF: 56 %. Stress left ventricular function is normal. Stress EF: 61 %. End diastolic cavity size is normal.   Myocardial blood flow was computed to be 0.35ml/g/min at rest and 1.89ml/g/min at stress. Global myocardial blood flow reserve was 1.77 and was mildly abnormal.   Coronary calcium assessment not performed due to prior revascularization.   The study is normal. The study is low risk. Myocardial blood flow reserve is mildly abnormal but otherwise this is a low risk study based on  perfusion imaging.  TTE, 01/10/2021  1. Left ventricular ejection fraction, by estimation, is 55 to 60%. The left ventricle has normal function. Left ventricular endocardial border not optimally defined to evaluate regional wall motion. Left ventricular diastolic parameters are indeterminate.   2. Right ventricular systolic function is normal. The right ventricular size is normal. There is normal pulmonary artery systolic pressure. The estimated right ventricular systolic pressure is 31.4 mmHg.   3. The mitral valve is normal in structure. Mild mitral valve regurgitation. No evidence of mitral stenosis.   4. Tricuspid valve regurgitation is moderate.   5. The aortic valve is normal in structure. Aortic valve regurgitation is moderate. Mild aortic valve sclerosis is present, with no evidence of aortic valve stenosis. Aortic regurgitation PHT measures 502 msec.   6. Moderately dilated pulmonary artery.   7. The inferior vena cava is normal in size with <50% respiratory variability, suggesting right atrial pressure of 8 mmHg.   Comparison(s): Previous Echo showed LV EF 50-55%, with HK of the LV apical segment. Global strain was measured at -14%. Moderate AI, and thickened MV.    ASSESSMENT AND PLAN:  #) CHB s/p PPM Device  functioning well, see paceart for details Turned on rate response to address patient's SOB with strenuous activity Dependent   #) CAD s/p PCI No ischemic s/s  #) HTN Elevated in office today Patient will check BP at home over the next 1-2 weeks, if elevated, she will increase losartan as previously prescribed and notify office    Current medicines are reviewed at length with the patient today.   The patient does not have concerns regarding her medicines.  The following changes were made today:  none  Labs/ tests ordered today include:  Orders Placed This Encounter  Procedures   EKG 12-Lead     Disposition: Follow up with Dr. Lalla Brothers or EP APP in 12 months   Signed, Sherie Don, NP  11/09/22  12:04 PM  Electrophysiology [facility]

## 2022-11-09 ENCOUNTER — Encounter: Payer: Self-pay | Admitting: Cardiology

## 2022-11-09 ENCOUNTER — Ambulatory Visit: Payer: Medicare PPO | Attending: Cardiology | Admitting: Cardiology

## 2022-11-09 VITALS — BP 152/70 | HR 61 | Ht 70.0 in | Wt 160.0 lb

## 2022-11-09 DIAGNOSIS — I10 Essential (primary) hypertension: Secondary | ICD-10-CM | POA: Diagnosis not present

## 2022-11-09 DIAGNOSIS — Z950 Presence of cardiac pacemaker: Secondary | ICD-10-CM

## 2022-11-09 DIAGNOSIS — I25119 Atherosclerotic heart disease of native coronary artery with unspecified angina pectoris: Secondary | ICD-10-CM | POA: Diagnosis not present

## 2022-11-09 DIAGNOSIS — I441 Atrioventricular block, second degree: Secondary | ICD-10-CM

## 2022-11-09 NOTE — Patient Instructions (Signed)
Medication Instructions:  Your physician recommends that you continue on your current medications as directed. Please refer to the Current Medication list given to you today.  *If you need a refill on your cardiac medications before your next appointment, please call your pharmacy*  Lab Work: No labs ordered  If you have labs (blood work) drawn today and your tests are completely normal, you will receive your results only by: MyChart Message (if you have MyChart) OR A paper copy in the mail If you have any lab test that is abnormal or we need to change your treatment, we will call you to review the results.  Testing/Procedures: No testing ordered  Follow-Up: At [facility], you and your health needs are our priority.  As part of our continuing mission to provide you with exceptional heart care, we have created designated Provider Care Teams.  These Care Teams include your primary Cardiologist (physician) and Advanced Practice Providers (APPs -  Physician Assistants and Nurse Practitioners) who all work together to provide you with the care you need, when you need it.  We recommend signing up for the patient portal called "MyChart".  Sign up information is provided on this After Visit Summary.  MyChart is used to connect with patients for Virtual Visits (Telemedicine).  Patients are able to view lab/test results, encounter notes, upcoming appointments, etc.  Non-urgent messages can be sent to your provider as well.   To learn more about what you can do with MyChart, go to [email or web address].    Your next appointment:   1 year(s)  Provider:   Cameron Lambert, MD or Suzann Riddle, NP 

## 2022-12-12 ENCOUNTER — Telehealth: Payer: Self-pay

## 2022-12-12 DIAGNOSIS — I10 Essential (primary) hypertension: Secondary | ICD-10-CM

## 2022-12-12 MED ORDER — LOSARTAN POTASSIUM 25 MG PO TABS: ORAL_TABLET | ORAL | 0 refills | Status: DC

## 2022-12-12 NOTE — Telephone Encounter (Signed)
Prescription Request  12/12/2022  LOV: Visit date not found  What is the name of the medication or equipment? losartan (COZAAR) 25 MG tablet  Have you contacted your pharmacy to request a refill? Yes   Which pharmacy would you like this sent to?  [facility] [phone] [place]. [address], [place], [place] [address].    Patient notified that their request is being sent to the clinical staff for review and that they should receive a response within 2 business days.   Please advise at Mobile [phone] (mobile)  Noreene Larsson from [facility] in [place] states patient told pharmacy that she would like for [place] to send her refill to this pharmacy, as she will be in that area.  Noreene Larsson states patient may have one dose left.  Noreene Larsson states she also sent [place] a request via fax.

## 2022-12-19 ENCOUNTER — Ambulatory Visit (INDEPENDENT_AMBULATORY_CARE_PROVIDER_SITE_OTHER): Payer: Medicare PPO

## 2022-12-19 DIAGNOSIS — I441 Atrioventricular block, second degree: Secondary | ICD-10-CM

## 2022-12-20 LAB — CUP PACEART REMOTE DEVICE CHECK
Battery Impedance: 2044 Ohm
Battery Remaining Longevity: 32 mo
Battery Voltage: 2.73 V
Brady Statistic AP VP Percent: 64 %
Brady Statistic AP VS Percent: 0 %
Brady Statistic AS VP Percent: 36 %
Brady Statistic AS VS Percent: 0 %
Date Time Interrogation Session: 20240604135657
Implantable Lead Connection Status: 753985
Implantable Lead Connection Status: 753985
Implantable Lead Implant Date: 20040907
Implantable Lead Implant Date: 20040907
Implantable Lead Location: 753859
Implantable Lead Location: 753860
Implantable Lead Model: 5076
Implantable Lead Model: 5076
Implantable Pulse Generator Implant Date: 20130607
Lead Channel Impedance Value: 448 Ohm
Lead Channel Impedance Value: 657 Ohm
Lead Channel Pacing Threshold Amplitude: 0.625 V
Lead Channel Pacing Threshold Amplitude: 0.75 V
Lead Channel Pacing Threshold Pulse Width: 0.4 ms
Lead Channel Pacing Threshold Pulse Width: 0.4 ms
Lead Channel Setting Pacing Amplitude: 2 V
Lead Channel Setting Pacing Amplitude: 2.5 V
Lead Channel Setting Pacing Pulse Width: 0.4 ms
Lead Channel Setting Sensing Sensitivity: 2 mV
Zone Setting Status: 755011
Zone Setting Status: 755011

## 2022-12-23 ENCOUNTER — Ambulatory Visit
Admission: EM | Admit: 2022-12-23 | Discharge: 2022-12-23 | Disposition: A | Discharge status: Home / Self Care | Payer: Medicare PPO

## 2022-12-23 DIAGNOSIS — W57XXXA Bitten or stung by nonvenomous insect and other nonvenomous arthropods, initial encounter: Secondary | ICD-10-CM

## 2022-12-23 DIAGNOSIS — R220 Localized swelling, mass and lump, head: Secondary | ICD-10-CM | POA: Diagnosis not present

## 2022-12-23 DIAGNOSIS — S00561A Insect bite (nonvenomous) of lip, initial encounter: Secondary | ICD-10-CM | POA: Diagnosis not present

## 2022-12-23 NOTE — ED Provider Notes (Signed)
Madeline Schaefer    CSN: [id number] Arrival date & time: 12/23/22  0805      History   Chief Complaint Chief Complaint  Patient presents with   Oral Swelling    HPI Madeline Schaefer is a 77 y.o. female.   HPI  Patient presents to urgent care with complaint of "bump" on her lip with swelling starting 2 days ago.  She has been applying ice as well as cortisone cream on it without relief.  She reports allergy to wasp/hornet/yellowjacket stings and carries EpiPen.  She reports previous allergic reactions to insect bites.  Denies previous occurrences of similar lesions on her lip.  Denies tongue swelling.  Denies difficulty speaking, swallowing, breathing.  Past Medical History:  Diagnosis Date   Aortic insufficiency    a. 12/2020 Echo: EF 55-60%, no rwma, nl RV fxn, RVSP 31.59mmHg, mild MR, mod TR, mild Ao sclerosis. Mod AI. Mod dil PA.   Aortic valve sclerosis    Blood transfusion without reported diagnosis    BRONCHITIS    CAD (coronary artery disease)    a. 08/2001 PCI: LAD 60m (2.75x13 Zeta BMS); b. 12/2009 Cath: LM nl, LAD <30ISR, RI nl, LCX nl, RCA nl, EF nl; c. 01/2017 MV: small apical inf defect w/o ischemia. EF 59%; d. 11/2021 Lexi PET/CT: EF 56% (61% w/ stress), small fixed apical/inf defect 2/2 PPM (artifact). No ischemia-->low risk.   Cardiac pacemaker in situ    a. 2004 s/p PPM (Brodie); b. 12/2011 Gen Change (Allred): MD Adapta L ADDRL1 DC PPM (ser # [id number]).   Cataract    DYSPNEA    HLD (hyperlipidemia)    Hypothyroidism    Mobitz type 2 second degree heart block    a. 2004 s/p PPM (Brodie); b. 12/2011 Gen Change (Allred): MD Adapta L ADDRL1 DC PPM (ser # [id number]).   Osteopenia    Tricuspid regurgitation     Patient Active Problem List   Diagnosis Date Noted   Skin rash 05/27/2021   Loss of weight 11/21/2018   Change in bowel habits 11/21/2018   Dyslipidemia 01/20/2017   Coronary artery disease involving native coronary artery of native heart with  angina pectoris (HCC) 01/20/2017   Aortic valve regurgitation 12/15/2016   Ischemic cardiomyopathy 12/15/2016   Osteopenia 11/16/2014   Adjustment disorder with mixed anxiety and depressed mood 09/26/2013   Essential hypertension 08/21/2013   Hyperglycemia 11/20/2012   Eczema 11/20/2012   Routine general medical examination at a health care facility 11/20/2012   Second degree Mobitz II AV block 01/27/2011   PACEMAKER-Medtronic 12/01/2008   Hypothyroidism 08/03/2008   Hyperlipidemia 08/03/2008   Coronary atherosclerosis 08/03/2008    Past Surgical History:  Procedure Laterality Date   CARDIAC CATHETERIZATION     CYSTOSCOPY WITH RETROGRADE PYELOGRAM, URETEROSCOPY AND STENT PLACEMENT  08/19/2012   Procedure: CYSTOSCOPY WITH RETROGRADE PYELOGRAM, URETEROSCOPY AND STENT PLACEMENT;  Surgeon: Milford Cage, MD;  Location[facility];  Service: Urology;  Laterality: Right;   MYOMECTOMY     PACEMAKER GENERATOR CHANGE N/A 12/22/2011   Procedure: PACEMAKER GENERATOR CHANGE;  Surgeon: Hillis Range, MD;  Location: [facility] CATH LAB;  Service: Cardiovascular;  Laterality: N/A;   PACEMAKER INSERTION  2004, 12/22/11   initial implant by Dr Juanda Chance, Generator change (MDT Adapta L) by Dr Johney Frame 6/13   TOTAL ABDOMINAL HYSTERECTOMY W/ BILATERAL SALPINGOOPHORECTOMY      OB History   No obstetric history on file.      Home Medications  Prior to Admission medications   Medication Sig Start Date End Date Taking? Authorizing Provider  aspirin EC 81 MG tablet Take 81 mg by mouth daily.   Yes [provider]  atorvastatin (LIPITOR) 80 MG tablet Take 1 tablet by mouth once daily 03/27/22  Yes Arnett, Lyn Records, FNP  atorvastatin (LIPITOR) 80 MG tablet Take 1 tablet (80 mg total) by mouth daily. 03/27/22  Yes Allegra Grana, FNP  carvedilol (COREG) 3.125 MG tablet Take 1 tablet (3.125 mg total) by mouth 2 (two) times daily with a meal. 03/27/22  Yes Arnett, Lyn Records, FNP   clobetasol ointment (TEMOVATE) 0.05 % Apply 1 application topically daily as needed (eczema).  09/03/13  Yes [provider]  clotrimazole-betamethasone (LOTRISONE) cream Apply 1 application topically 2 (two) times daily. 05/27/21  Yes Sandford Craze, NP  fexofenadine (ALLEGRA) 180 MG tablet    Yes [provider]  ibuprofen (ADVIL) 200 MG tablet Take 2 tablets (400 mg total) by mouth every 8 (eight) hours as needed for moderate pain (with food). 03/21/18  Yes Nche, Bonna Gains, NP  levothyroxine (SYNTHROID) 88 MCG tablet Take 1 tablet by mouth once daily 10/09/22  Yes Allegra Grana, FNP  losartan (COZAAR) 25 MG tablet TAKE 1 TABLET BY MOUTH ONCE DAILY -  TAKE  AN  ADDITIONAL  1/2  TAB  IF  BP  GREATER  THAN  140/80 12/12/22  Yes Allegra Grana, FNP  EPINEPHrine 0.3 mg/0.3 mL IJ SOAJ injection Inject 0.3 mLs (0.3 mg total) into the muscle once. 08/06/15   Sandford Craze, NP  pantoprazole (PROTONIX) 40 MG tablet Take 1 tablet (40 mg total) by mouth 2 (two) times daily before a meal. Patient taking differently: Take 40 mg by mouth 2 (two) times daily as needed. 07/11/22   Doree Albee, PA-C    Family History Family History  Problem Relation Age of Onset   Heart attack Mother        died at age 81   Breast cancer Mother        age 95, had mastectomy.    Prostate cancer Father    Colon cancer Neg Hx    Esophageal cancer Neg Hx    Rectal cancer Neg Hx     Social History Social History   Tobacco Use   Smoking status: Never   Smokeless tobacco: Never  Vaping Use   Vaping Use: Never used  Substance Use Topics   Alcohol use: Yes    Comment: 1-2 drink daily   Drug use: No     Allergies   Bee venom, Codeine, Pneumovax 23 [pneumococcal vac polyvalent], Lisinopril, Hornet venom, Sulfa antibiotics, and Sulfonamide derivatives   Review of Systems Review of Systems   Physical Exam Triage Vital Signs ED Triage Vitals  Enc Vitals Group     BP  12/23/22 0829 (!) 154/85     Pulse Rate 12/23/22 0829 70     Resp 12/23/22 0829 18     Temp 12/23/22 0829 98.4 F (36.9 C)     Temp Source 12/23/22 0829 Oral     SpO2 12/23/22 0829 98 %     Weight --      Height --      Head Circumference --      Peak Flow --      Pain Score 12/23/22 0835 2     Pain Loc --      Pain Edu? --      Excl.  in GC? --    No data found.  Updated Vital Signs BP (!) 154/85 (BP Location: Left Arm)   Pulse 70   Temp 98.4 F (36.9 C) (Oral)   Resp 18   LMP 10/17/1986   SpO2 98%   Visual Acuity Right Eye Distance:   Left Eye Distance:   Bilateral Distance:    Right Eye Near:   Left Eye Near:    Bilateral Near:     Physical Exam Vitals reviewed.  Constitutional:      Appearance: Normal appearance.  HENT:     Mouth/Throat:   Skin:    General: Skin is warm and dry.  Neurological:     General: No focal deficit present.     Mental Status: She is alert and oriented to person, place, and time.  Psychiatric:        Mood and Affect: Mood normal.        Behavior: Behavior normal.      UC Treatments / Results  Labs (all labs ordered are listed, but only abnormal results are displayed) Labs Reviewed - No data to display  EKG   Radiology No results found.  Procedures Procedures (including critical care time)  Medications Ordered in UC Medications - No data to display  Initial Impression / Assessment and Plan / UC Course  I have reviewed the triage vital signs and the nursing notes.  Pertinent labs & imaging results that were available during my care of the patient were reviewed by me and considered in my medical decision making (see chart for details).   Madeline Schaefer is a 77 y.o. female presenting with local inflammatory reaction to the lower lip secondary to a likely insect bite. Patient is afebrile without recent antipyretics, satting well on room air. Overall is well appearing though non-toxic, well hydrated, without  respiratory distress.  Hypopigmented lesion, appears scabbed, on her lower lip, approximately 3 mm's, surrounded by a local area of inflammation, approximately 1 cm's.  Reviewed relevant chart history. Additional history obtained from patient family/caregiver present during the exam.  Likely local inflammatory reaction to the lower lip secondary to insect bite.  Patient has no red flags.  No difficulty breathing, speaking, swallowing.  She has no history of ulcerated lesions suspicious for herpetic etiology.  Recommending use of antihistamines to treat her symptoms.  Discussed worsening symptoms including difficulty speaking, breathing, swallowing, widening of the area of inflammation which would necessitate a more aggressive treatment using corticosteroids.  Counseled patient on potential for adverse effects with medications prescribed/recommended today, ER and return-to-clinic precautions discussed, patient verbalized understanding and agreement with care plan.  Final Clinical Impressions(s) / UC Diagnoses   Final diagnoses:  None   Discharge Instructions   None    ED Prescriptions   None    PDMP not reviewed this encounter.   Charma Igo, [place] 12/23/22 [phone]

## 2022-12-23 NOTE — Discharge Instructions (Addendum)
You were treated today for a local inflammatory reaction following a likely insect bite on your left.  Recommend you use a non-sedating antihistamine such as Zyrtec (cetirizine) 10 mg daily or Claritin (loratadine) 10 mg daily. You may also use Benadryl (diphenhydramine) 25 mg every 4 hours, however be aware that this is likely to make you sleepy

## 2022-12-23 NOTE — ED Triage Notes (Signed)
Pt states she woke up 2 days ago with bump on her lip with swelling. Pt has tried putting ice and Cortizone on it with no relief.

## 2022-12-25 DIAGNOSIS — L0889 Other specified local infections of the skin and subcutaneous tissue: Secondary | ICD-10-CM | POA: Diagnosis not present

## 2022-12-25 DIAGNOSIS — R21 Rash and other nonspecific skin eruption: Secondary | ICD-10-CM | POA: Diagnosis not present

## 2022-12-25 DIAGNOSIS — B009 Herpesviral infection, unspecified: Secondary | ICD-10-CM | POA: Diagnosis not present

## 2023-01-11 NOTE — Progress Notes (Signed)
Remote pacemaker transmission.   

## 2023-02-20 ENCOUNTER — Encounter: Payer: Self-pay | Admitting: Family

## 2023-02-20 ENCOUNTER — Ambulatory Visit: Payer: Medicare PPO | Admitting: Family

## 2023-02-20 VITALS — BP 130/60 | HR 76 | Temp 97.3°F | Ht 70.0 in | Wt 155.6 lb

## 2023-02-20 DIAGNOSIS — E039 Hypothyroidism, unspecified: Secondary | ICD-10-CM

## 2023-02-20 DIAGNOSIS — I255 Ischemic cardiomyopathy: Secondary | ICD-10-CM | POA: Diagnosis not present

## 2023-02-20 DIAGNOSIS — R7303 Prediabetes: Secondary | ICD-10-CM

## 2023-02-20 DIAGNOSIS — I25119 Atherosclerotic heart disease of native coronary artery with unspecified angina pectoris: Secondary | ICD-10-CM

## 2023-02-20 DIAGNOSIS — I10 Essential (primary) hypertension: Secondary | ICD-10-CM | POA: Diagnosis not present

## 2023-02-20 LAB — LIPID PANEL
Cholesterol: 128 mg/dL (ref 0–200)
HDL: 48.2 mg/dL (ref >39.00)
LDL Cholesterol: 66 mg/dL (ref 0–99)
NonHDL: 79.32
Total CHOL/HDL Ratio: 3
Triglycerides: 66 mg/dL (ref 0.0–149.0)
VLDL: 13.2 mg/dL (ref 0.0–40.0)

## 2023-02-20 LAB — CBC WITH DIFFERENTIAL/PLATELET
Basophils Absolute: 0 10*3/uL (ref 0.0–0.1)
Basophils Relative: 0.8 % (ref 0.0–3.0)
Eosinophils Absolute: 0.2 10*3/uL (ref 0.0–0.7)
Eosinophils Relative: 3.8 % (ref 0.0–5.0)
HCT: 41.5 % (ref 36.0–46.0)
Hemoglobin: 13.4 g/dL (ref 12.0–15.0)
Lymphocytes Relative: 25.3 % (ref 12.0–46.0)
Lymphs Abs: 1.2 10*3/uL (ref 0.7–4.0)
MCHC: 32.4 g/dL (ref 30.0–36.0)
MCV: 88.7 fl (ref 78.0–100.0)
Monocytes Absolute: 0.4 10*3/uL (ref 0.1–1.0)
Monocytes Relative: 7.9 % (ref 3.0–12.0)
Neutro Abs: 3 10*3/uL (ref 1.4–7.7)
Neutrophils Relative %: 62.2 % (ref 43.0–77.0)
Platelets: 245 10*3/uL (ref 150.0–400.0)
RBC: 4.68 Mil/uL (ref 3.87–5.11)
RDW: 14.7 % (ref 11.5–15.5)
WBC: 4.8 10*3/uL (ref 4.0–10.5)

## 2023-02-20 LAB — COMPREHENSIVE METABOLIC PANEL
ALT: 18 U/L (ref 0–35)
AST: 18 U/L (ref 0–37)
Albumin: 4.2 g/dL (ref 3.5–5.2)
Alkaline Phosphatase: 63 U/L (ref 39–117)
BUN: 19 mg/dL (ref 6–23)
CO2: 27 mEq/L (ref 19–32)
Calcium: 9.3 mg/dL (ref 8.4–10.5)
Chloride: 102 mEq/L (ref 96–112)
Creatinine, Ser: 0.74 mg/dL (ref 0.40–1.20)
GFR: 78.5 mL/min (ref >60.00)
Glucose, Bld: 97 mg/dL (ref 70–99)
Potassium: 4.2 mEq/L (ref 3.5–5.1)
Sodium: 138 mEq/L (ref 135–145)
Total Bilirubin: 1 mg/dL (ref 0.2–1.2)
Total Protein: 7.3 g/dL (ref 6.0–8.3)

## 2023-02-20 LAB — HEMOGLOBIN A1C: Hgb A1c MFr Bld: 6.3 % (ref 4.6–6.5)

## 2023-02-20 LAB — TSH: TSH: 1.28 u[IU]/mL (ref 0.35–5.50)

## 2023-02-20 MED ORDER — ATORVASTATIN CALCIUM 80 MG PO TABS: 80.0000 mg | ORAL_TABLET | Freq: Every day | ORAL | 4 refills | Status: DC

## 2023-02-20 MED ORDER — CARVEDILOL 3.125 MG PO TABS: 3.1250 mg | ORAL_TABLET | Freq: Two times a day (BID) | ORAL | 2 refills | Status: DC

## 2023-02-20 MED ORDER — LOSARTAN POTASSIUM 25 MG PO TABS: ORAL_TABLET | ORAL | 0 refills | Status: DC

## 2023-02-20 NOTE — Assessment & Plan Note (Signed)
Chronic, symptomatically stable.  Continue aspirin 81 mg, Lipitor 80 mg daily. Pending  lipid panel.

## 2023-02-20 NOTE — Assessment & Plan Note (Signed)
Telemetry elevated in the office today however patient reports average 130/60 at home.  Emphasized the importance of continued monitoring blood pressure at home.  Continue Coreg 3.125 mg twice daily, losartan 25 mg daily.  She will continue to follow with Dr. Lalla Brothers, Dr Antoine Poche.

## 2023-02-20 NOTE — Progress Notes (Signed)
Assessment & Plan:  Prediabetes -     Hemoglobin A1c -     Lipid panel  Ischemic cardiomyopathy -     Lipid panel -     Carvedilol; Take 1 tablet (3.125 mg total) by mouth 2 (two) times daily with a meal.  Dispense: 180 tablet; Refill: 2  Essential hypertension Assessment & Plan: Telemetry elevated in the office today however patient reports average 130/60 at home.  Emphasized the importance of continued monitoring blood pressure at home.  Continue Coreg 3.125 mg twice daily, losartan 25 mg daily.  She will continue to follow with Dr. Lalla Brothers, Dr Antoine Poche.  Orders: -     Lipid panel -     Losartan Potassium; TAKE 1 TABLET BY MOUTH ONCE DAILY -  TAKE  AN  ADDITIONAL  1/2  TAB  IF  BP  GREATER  THAN  140/80  Dispense: 90 tablet; Refill: 0 -     Comprehensive metabolic panel -     CBC with Differential/Platelet  Acquired hypothyroidism -     TSH  Atherosclerosis of native coronary artery of native heart with angina pectoris (HCC) -     Lipid panel  Coronary artery disease involving native coronary artery of native heart with angina pectoris [facility]) Assessment & Plan: Chronic, symptomatically stable.  Continue aspirin 81 mg, Lipitor 80 mg daily. Pending  lipid panel.   Other orders -     Atorvastatin Calcium; Take 1 tablet (80 mg total) by mouth daily.  Dispense: 90 tablet; Refill: 4     Return precautions given.   Risks, benefits, and alternatives of the medications and treatment plan prescribed today were discussed, and patient expressed understanding.   Education regarding symptom management and diagnosis given to patient on AVS either electronically or printed.  Return in about 6 months (around 08/23/2023).  Rennie Plowman, FNP  Subjective:    Patient ID: Madeline Schaefer, female    DOB: 1945-10-14, 77 y.o.   MRN: [id number]  CC: Madeline Schaefer is a 77 y.o. female who presents today for follow up.   HPI: Feels well today.  No new complaints   BP at home 130/60  usually    Recently had follow-up cardiology in Dr Lovena Neighbours office, 11/09/2022 for second-degree Mobitz 2II AV block, pacemaker, CAD  She is scheduled to see cardiology, Dr Antoine Poche 05/10/23  Allergies: Bee venom, Codeine, Pneumovax 23 [pneumococcal vac polyvalent], Lisinopril, Hornet venom, Sulfa antibiotics, and Sulfonamide derivatives Current Outpatient Medications on File Prior to Visit  Medication Sig Dispense Refill   aspirin EC 81 MG tablet Take 81 mg by mouth daily.     clobetasol ointment (TEMOVATE) 0.05 % Apply 1 application topically daily as needed (eczema).      clotrimazole-betamethasone (LOTRISONE) cream Apply 1 application topically 2 (two) times daily. 30 g 0   EPINEPHrine 0.3 mg/0.3 mL IJ SOAJ injection Inject 0.3 mLs (0.3 mg total) into the muscle once. 2 Device 1   fexofenadine (ALLEGRA) 180 MG tablet      ibuprofen (ADVIL) 200 MG tablet Take 2 tablets (400 mg total) by mouth every 8 (eight) hours as needed for moderate pain (with food). 30 tablet 0   levothyroxine (SYNTHROID) 88 MCG tablet Take 1 tablet by mouth once daily 90 tablet 3   No current facility-administered medications on file prior to visit.    Review of Systems  Constitutional:  Negative for chills and fever.  Respiratory:  Negative for cough.   Cardiovascular:  Negative  for chest pain and palpitations.  Gastrointestinal:  Negative for nausea and vomiting.      Objective:    BP 130/60   Pulse 76   Temp (!) 97.3 F (36.3 C) (Oral)   Ht 5\' 10"  (1.778 m)   Wt 155 lb 9.6 oz (70.6 kg)   LMP 10/17/1986   SpO2 99%   BMI 22.33 kg/m  BP Readings from Last 3 Encounters:  02/20/23 130/60  12/23/22 (!) 154/85  11/09/22 (!) 152/70   Wt Readings from Last 3 Encounters:  02/20/23 155 lb 9.6 oz (70.6 kg)  11/09/22 160 lb (72.6 kg)  07/04/22 164 lb 8 oz (74.6 kg)    Physical Exam Vitals reviewed.  Constitutional:      Appearance: She is well-developed.  Eyes:     Conjunctiva/sclera:  Conjunctivae normal.  Cardiovascular:     Rate and Rhythm: Normal rate and regular rhythm.     Pulses: Normal pulses.     Heart sounds: Normal heart sounds.  Pulmonary:     Effort: Pulmonary effort is normal.     Breath sounds: Normal breath sounds. No wheezing, rhonchi or rales.  Skin:    General: Skin is warm and dry.  Neurological:     Mental Status: She is alert.  Psychiatric:        Speech: Speech normal.        Behavior: Behavior normal.        Thought Content: Thought content normal.

## 2023-02-20 NOTE — Patient Instructions (Addendum)
Monitor blood pressure at home and me 5-6 reading on separate days. Goal is less than 120/80, based on newest guidelines, however we certainly want to be less than 130/80;  if persistently higher, please make sooner follow up appointment so we can recheck you blood pressure and manage/ adjust medications.   Nice to see you today!

## 2023-02-28 ENCOUNTER — Telehealth: Payer: Self-pay

## 2023-02-28 NOTE — Telephone Encounter (Signed)
LVM to call back to go over results 

## 2023-02-28 NOTE — Telephone Encounter (Signed)
Patient just returned call. She said she viewed her results on MyChart. Her number is [phone]. If you need to call her back.

## 2023-02-28 NOTE — Telephone Encounter (Signed)
Pt has viewed results pt has no questions

## 2023-03-01 DIAGNOSIS — D2262 Melanocytic nevi of left upper limb, including shoulder: Secondary | ICD-10-CM | POA: Diagnosis not present

## 2023-03-01 DIAGNOSIS — D692 Other nonthrombocytopenic purpura: Secondary | ICD-10-CM | POA: Diagnosis not present

## 2023-03-01 DIAGNOSIS — D225 Melanocytic nevi of trunk: Secondary | ICD-10-CM | POA: Diagnosis not present

## 2023-03-01 DIAGNOSIS — L570 Actinic keratosis: Secondary | ICD-10-CM | POA: Diagnosis not present

## 2023-03-01 DIAGNOSIS — L821 Other seborrheic keratosis: Secondary | ICD-10-CM | POA: Diagnosis not present

## 2023-03-01 DIAGNOSIS — L218 Other seborrheic dermatitis: Secondary | ICD-10-CM | POA: Diagnosis not present

## 2023-03-01 DIAGNOSIS — L814 Other melanin hyperpigmentation: Secondary | ICD-10-CM | POA: Diagnosis not present

## 2023-03-01 DIAGNOSIS — D2261 Melanocytic nevi of right upper limb, including shoulder: Secondary | ICD-10-CM | POA: Diagnosis not present

## 2023-03-01 DIAGNOSIS — L820 Inflamed seborrheic keratosis: Secondary | ICD-10-CM | POA: Diagnosis not present

## 2023-03-20 ENCOUNTER — Ambulatory Visit (INDEPENDENT_AMBULATORY_CARE_PROVIDER_SITE_OTHER): Payer: Medicare PPO

## 2023-03-20 DIAGNOSIS — I441 Atrioventricular block, second degree: Secondary | ICD-10-CM

## 2023-03-21 LAB — CUP PACEART REMOTE DEVICE CHECK
Battery Impedance: 2381 Ohm
Battery Remaining Longevity: 27 mo
Battery Voltage: 2.73 V
Brady Statistic AP VP Percent: 64 %
Brady Statistic AP VS Percent: 0 %
Brady Statistic AS VP Percent: 36 %
Brady Statistic AS VS Percent: 0 %
Date Time Interrogation Session: 20240904124658
Implantable Lead Connection Status: 753985
Implantable Lead Connection Status: 753985
Implantable Lead Implant Date: 20040907
Implantable Lead Implant Date: 20040907
Implantable Lead Location: 753859
Implantable Lead Location: 753860
Implantable Lead Model: 5076
Implantable Lead Model: 5076
Implantable Pulse Generator Implant Date: 20130607
Lead Channel Impedance Value: 438 Ohm
Lead Channel Impedance Value: 671 Ohm
Lead Channel Pacing Threshold Amplitude: 0.625 V
Lead Channel Pacing Threshold Amplitude: 0.75 V
Lead Channel Pacing Threshold Pulse Width: 0.4 ms
Lead Channel Pacing Threshold Pulse Width: 0.4 ms
Lead Channel Setting Pacing Amplitude: 2 V
Lead Channel Setting Pacing Amplitude: 2.5 V
Lead Channel Setting Pacing Pulse Width: 0.4 ms
Lead Channel Setting Sensing Sensitivity: 2 mV
Zone Setting Status: 755011
Zone Setting Status: 755011

## 2023-03-26 ENCOUNTER — Encounter: Payer: Self-pay | Admitting: Family

## 2023-03-26 NOTE — Telephone Encounter (Signed)
Added to chart

## 2023-04-02 NOTE — Progress Notes (Signed)
Remote pacemaker transmission.   

## 2023-05-01 DIAGNOSIS — Z1231 Encounter for screening mammogram for malignant neoplasm of breast: Secondary | ICD-10-CM | POA: Diagnosis not present

## 2023-05-01 LAB — HM MAMMOGRAPHY

## 2023-05-02 ENCOUNTER — Encounter: Payer: Self-pay | Admitting: Family

## 2023-05-09 NOTE — Progress Notes (Deleted)
  Cardiology Office Note:   Date:  05/09/2023  ID:  DEBROHA SALIK, DOB 09-01-1945, MRN [id number] PCP: Allegra Grana, FNP  [facility] Providers Cardiologist:  Rollene Rotunda, MD Electrophysiologist:  Lanier Prude, MD {  History of Present Illness:   Madeline Schaefer is a 77 y.o. female  who presents for follow up of CAD. I have not seen her since 2019.   He has a mildly reduced EF and moderate AI.   She has a distant history of stenting to her LAD. In  2011 she had an EF was 40 - 45%.  She had mild AI.  Her EF in June of this 2022 was 60%  with moderate AI.    She has a pacemaker and recently saw Dr. Lalla Brothers.  She was having some dypsnea.    She had a PET scan.  This demonstrated a small fixed defect with no ischemia .  The EF was 61%.     Since I last saw her ***   *** She said she been getting a little short of breath walking up an incline but this is not getting any worse.  The patient denies any new symptoms such as chest discomfort, neck or arm discomfort. There has been no new PND or orthopnea. There have been no reported palpitations, presyncope or syncope.   ROS: ***  Studies Reviewed:    EKG:       ***  Risk Assessment/Calculations:   {Does this patient have ATRIAL FIBRILLATION?:[phone]} No BP recorded.  {Refresh Note OR Click here to enter BP  :1}***        Physical Exam:   VS:  LMP 10/17/1986    Wt Readings from Last 3 Encounters:  02/20/23 155 lb 9.6 oz (70.6 kg)  11/09/22 160 lb (72.6 kg)  07/04/22 164 lb 8 oz (74.6 kg)     GEN: Well nourished, well developed in no acute distress NECK: No JVD; No carotid bruits CARDIAC: ***RRR, no murmurs, rubs, gallops RESPIRATORY:  Clear to auscultation without rales, wheezing or rhonchi  ABDOMEN: Soft, non-tender, non-distended EXTREMITIES:  No edema; No deformity   ASSESSMENT AND PLAN:   CAD:    *** She had a recent negative PET.  However, I told her that if she has any increasing symptoms to let  me know and we will do further evaluation.   STATUS POST PACEMAKER:    ***  She is up-to-date with follow-up and I did review the EP note as above.     HTN:      Blood pressure is *** at target.  No change in therapy.    DYSLIPIDEMIA:    Her LDL is ***  73 in November with an HDL of 46.  No change in therapy.    REDUCED EF:   EF was  NL on echo in 2022.  *** NL above.  No change in therapy    AI/MR:  There was moderate AI and mild MR in June 2022.  ***   last year.  I will likely follow this up next year with repeat echocardiography    I will follow this up as above.        {Are you ordering a CV Procedure (e.g. stress test, cath, DCCV, TEE, etc)?   Press F2        :[id number]}  Follow up ***  Signed, Rollene Rotunda, MD

## 2023-05-10 ENCOUNTER — Ambulatory Visit: Payer: Medicare PPO | Admitting: Cardiology

## 2023-05-10 DIAGNOSIS — I25119 Atherosclerotic heart disease of native coronary artery with unspecified angina pectoris: Secondary | ICD-10-CM

## 2023-05-10 DIAGNOSIS — I340 Nonrheumatic mitral (valve) insufficiency: Secondary | ICD-10-CM

## 2023-05-10 DIAGNOSIS — I10 Essential (primary) hypertension: Secondary | ICD-10-CM

## 2023-05-10 DIAGNOSIS — E785 Hyperlipidemia, unspecified: Secondary | ICD-10-CM

## 2023-05-16 DIAGNOSIS — M5412 Radiculopathy, cervical region: Secondary | ICD-10-CM | POA: Diagnosis not present

## 2023-05-16 DIAGNOSIS — M5416 Radiculopathy, lumbar region: Secondary | ICD-10-CM | POA: Diagnosis not present

## 2023-05-22 DIAGNOSIS — M6281 Muscle weakness (generalized): Secondary | ICD-10-CM | POA: Diagnosis not present

## 2023-05-22 DIAGNOSIS — M545 Low back pain, unspecified: Secondary | ICD-10-CM | POA: Diagnosis not present

## 2023-06-04 ENCOUNTER — Ambulatory Visit (INDEPENDENT_AMBULATORY_CARE_PROVIDER_SITE_OTHER): Payer: Medicare PPO | Admitting: *Deleted

## 2023-06-04 VITALS — Ht 70.0 in | Wt 150.0 lb

## 2023-06-04 DIAGNOSIS — Z0000 Encounter for general adult medical examination without abnormal findings: Secondary | ICD-10-CM | POA: Diagnosis not present

## 2023-06-04 NOTE — Progress Notes (Signed)
Subjective:   Madeline Schaefer is a 77 y.o. female who presents for Medicare Annual (Subsequent) preventive examination.  Visit Complete: Virtual I connected with  Michel Santee Greulich on 06/04/23 by a audio enabled telemedicine application and verified that I am speaking with the correct person using two identifiers.  Patient Location: Home  Provider Location: Office/Clinic  I discussed the limitations of evaluation and management by telemedicine. The patient expressed understanding and agreed to proceed.  Vital Signs: Because this visit was a virtual/telehealth visit, some criteria may be missing or patient reported. Any vitals not documented were not able to be obtained and vitals that have been documented are patient reported.  Cardiac Risk Factors include: advanced age (>16men, >22 women);dyslipidemia;hypertension;Other (see comment), Risk factor comments: Pacemaker, CAD     Objective:    Today's Vitals   06/04/23 1250  Weight: 150 lb (68 kg)  Height: 5\' 10"  (1.778 m)   Body mass index is 21.52 kg/m.     06/04/2023    1:07 PM 05/25/2022    9:08 AM 05/30/2018    9:28 AM 09/15/2016   10:54 AM 10/15/2013    1:17 PM 08/19/2012   12:06 PM  Advanced Directives  Does Patient Have a Medical Advance Directive? Yes Yes Yes Yes Patient has advance directive, copy not in chart Patient has advance directive, copy not in chart  Type of Advance Directive Healthcare Power of [place];Living will Healthcare Power of [place];Living will Healthcare Power of [place];Living will Healthcare Power of [place];Living will  Healthcare Power of [place];Living will  Does patient want to make changes to medical advance directive?  No - Patient declined      Copy of Healthcare Power of Attorney in Chart? No - copy requested No - copy requested No - copy requested No - copy requested  --  Pre-existing out of facility DNR order (yellow form or pink MOST form)      No    Current Medications  (verified) Outpatient Encounter Medications as of 06/04/2023  Medication Sig   aspirin EC 81 MG tablet Take 81 mg by mouth daily.   atorvastatin (LIPITOR) 80 MG tablet Take 1 tablet (80 mg total) by mouth daily.   carvedilol (COREG) 3.125 MG tablet Take 1 tablet (3.125 mg total) by mouth 2 (two) times daily with a meal.   clobetasol ointment (TEMOVATE) 0.05 % Apply 1 application topically daily as needed (eczema).    clotrimazole-betamethasone (LOTRISONE) cream Apply 1 application topically 2 (two) times daily.   EPINEPHrine 0.3 mg/0.3 mL IJ SOAJ injection Inject 0.3 mLs (0.3 mg total) into the muscle once.   fexofenadine (ALLEGRA) 180 MG tablet    ibuprofen (ADVIL) 200 MG tablet Take 2 tablets (400 mg total) by mouth every 8 (eight) hours as needed for moderate pain (with food).   levothyroxine (SYNTHROID) 88 MCG tablet Take 1 tablet by mouth once daily   losartan (COZAAR) 25 MG tablet TAKE 1 TABLET BY MOUTH ONCE DAILY -  TAKE  AN  ADDITIONAL  1/2  TAB  IF  BP  GREATER  THAN  140/80   No facility-administered encounter medications on file as of 06/04/2023.    Allergies (verified) Bee venom, Codeine, Pneumovax 23 [pneumococcal vac polyvalent], Lisinopril, Hornet venom, Sulfa antibiotics, and Sulfonamide derivatives   History: Past Medical History:  Diagnosis Date   Aortic insufficiency    a. 12/2020 Echo: EF 55-60%, no rwma, nl RV fxn, RVSP 31.25mmHg, mild MR, mod TR, mild Ao sclerosis. Mod AI.  Mod dil PA.   Aortic valve sclerosis    Blood transfusion without reported diagnosis    BRONCHITIS    CAD (coronary artery disease)    a. 08/2001 PCI: LAD 19m (2.75x13 Zeta BMS); b. 12/2009 Cath: LM nl, LAD <30ISR, RI nl, LCX nl, RCA nl, EF nl; c. 01/2017 MV: small apical inf defect w/o ischemia. EF 59%; d. 11/2021 Lexi PET/CT: EF 56% (61% w/ stress), small fixed apical/inf defect 2/2 PPM (artifact). No ischemia-->low risk.   Cardiac pacemaker in situ    a. 2004 s/p PPM ([facility]); b. 12/2011 Gen Change  ([facility]): MD Adapta L ADDRL1 DC PPM (ser # [id number]).   Cataract    DYSPNEA    HLD (hyperlipidemia)    Hypothyroidism    Mobitz type 2 second degree heart block    a. 2004 s/p PPM ([facility]); b. 12/2011 Gen Change ([facility]): MD Adapta L ADDRL1 DC PPM (ser # [id number]).   Osteopenia    Tricuspid regurgitation    Past Surgical History:  Procedure Laterality Date   CARDIAC CATHETERIZATION     CYSTOSCOPY WITH RETROGRADE PYELOGRAM, URETEROSCOPY AND STENT PLACEMENT  08/19/2012   Procedure: CYSTOSCOPY WITH RETROGRADE PYELOGRAM, URETEROSCOPY AND STENT PLACEMENT;  Surgeon: Milford Cage, MD;  Location: [facility];  Service: Urology;  Laterality: Right;   MYOMECTOMY     PACEMAKER GENERATOR CHANGE N/A 12/22/2011   Procedure: PACEMAKER GENERATOR CHANGE;  Surgeon: Hillis Range, MD;  Location[facility] C[facility] LAB;  Service: Cardiovascular;  Laterality: N/A;   PACEMAKER INSERTION  2004, 12/22/11   initial implant by Dr Juanda Chance, Generator change (MDT Adapta L) by Dr Johney Frame 6/13   TOTAL ABDOMINAL HYSTERECTOMY W/ BILATERAL SALPINGOOPHORECTOMY     Family History  Problem Relation Age of Onset   Heart attack Mother        died at age 86   Breast cancer Mother        age 38, had mastectomy.    Prostate cancer Father    Colon cancer Neg Hx    Esophageal cancer Neg Hx    Rectal cancer Neg Hx    Social History   Socioeconomic History   Marital status: Married    Spouse name: Not on file   Number of children: 0   Years of education: Not on file   Highest education level: Not on file  Occupational History   Occupation: retired  Tobacco Use   Smoking status: Never   Smokeless tobacco: Never  Vaping Use   Vaping status: Never Used  Substance and Sexual Activity   Alcohol use: Yes    Comment: 1-2 drink daily   Drug use: No   Sexual activity: Yes  Other Topics Concern   Not on file  Social History Narrative   Retired Field seismologist   Enjoys RV, bikes   No children   No pets    Social Determinants of Health   Financial Resource Strain: Low Risk  (06/04/2023)   Overall Financial Resource Strain (CARDIA)    Difficulty of Paying Living Expenses: Not hard at all  Food Insecurity: No Food Insecurity (06/04/2023)   Hunger Vital Sign    Worried About Running Out of Food in the Last Year: Never true    Ran Out of Food in the Last Year: Never true  Transportation Needs: No Transportation Needs (06/04/2023)   PRAPARE - Administrator, Civil Service (Medical): No    Lack of Transportation (Non-Medical): No  Physical Activity: Sufficiently  Active (06/04/2023)   Exercise Vital Sign    Days of Exercise per Week: 5 days    Minutes of Exercise per Session: 60 min  Stress: No Stress Concern Present (06/04/2023)   [employer] of Occupational Health - Occupational Stress Questionnaire    Feeling of Stress : Only a little  Social Connections: Moderately Integrated (06/04/2023)   Social Connection and Isolation Panel [NHANES]    Frequency of Communication with Friends and Family: More than three times a week    Frequency of Social Gatherings with Friends and Family: More than three times a week    Attends Religious Services: Never    Database administrator or Organizations: Yes    Attends Engineer, structural: More than 4 times per year    Marital Status: Married    Tobacco Counseling Counseling given: Not Answered   Clinical Intake:  Pre-visit preparation completed: Yes  Pain : No/denies pain     BMI - recorded: 21.52 Nutritional Status: BMI of 19-24  Normal Nutritional Risks: None Diabetes: No  How often do you need to have someone help you when you read instructions, pamphlets, or other written materials from your doctor or pharmacy?: 1 - Never  Interpreter Needed?: No  Information entered by :: R. Alyx Gee LPN   Activities of Daily Living    06/04/2023   12:52 PM  In your present state of health, do you have any difficulty  performing the following activities:  Hearing? 0  Vision? 0  Comment glasses  Difficulty concentrating or making decisions? 0  Walking or climbing stairs? 0  Dressing or bathing? 0  Doing errands, shopping? 0  Preparing Food and eating ? N  Using the Toilet? N  In the past six months, have you accidently leaked urine? N  Do you have problems with loss of bowel control? N  Managing your Medications? N  Managing your Finances? N  Housekeeping or managing your Housekeeping? N    Patient Care Team: Allegra Grana, FNP as PCP - General (Family Medicine) Lanier Prude, MD as PCP - Electrophysiology (Cardiology) Rollene Rotunda, MD as PCP - Cardiology (Cardiology) Iva Boop, MD as Consulting Physician (Gastroenterology) Rollene Rotunda, MD as Consulting Physician (Cardiology)  Indicate any recent Medical Services you may have received from other than [facility] providers in the past year (date may be approximate).     Assessment:   This is a routine wellness examination for [place].  Hearing/Vision screen Hearing Screening - Comments:: No issues Vision Screening - Comments:: glasses   Goals Addressed             This Visit's Progress    Patient Stated       Wants to travel more and stay active       Depression Screen    06/04/2023   12:59 PM 02/20/2023   10:42 AM 05/30/2022    9:15 AM 05/25/2022    9:10 AM 02/22/2022   11:22 AM 09/20/2020   10:33 AM 06/03/2018    9:13 AM  PHQ 2/9 Scores  PHQ - 2 Score 0 0 0 0 0 0 0  PHQ- 9 Score 0          Fall Risk    06/04/2023   12:55 PM 02/20/2023   10:42 AM 05/30/2022    9:15 AM 05/25/2022    9:11 AM 02/22/2022   11:21 AM  Fall Risk   Falls in the past year? 0 0 0 0 0  Number falls in past yr: 0 0 0 0   Injury with Fall? 0 0 0    Risk for fall due to : No Fall Risks No Fall Risks No Fall Risks No Fall Risks No Fall Risks  Follow up Falls prevention discussed;Falls evaluation completed Falls evaluation completed  Falls evaluation completed Falls evaluation completed;Falls prevention discussed Falls evaluation completed    MEDICARE RISK AT HOME: Medicare Risk at Home Any stairs in or around the home?: Yes If so, are there any without handrails?: No Home free of loose throw rugs in walkways, pet beds, electrical cords, etc?: No (pet bed up against the wall) Adequate lighting in your home to reduce risk of falls?: Yes Life alert?: No Use of a cane, walker or w/c?: No Grab bars in the bathroom?: Yes Shower chair or bench in shower?: No Elevated toilet seat or a handicapped toilet?: Yes  Cognitive Function:        06/04/2023    1:08 PM 05/25/2022    9:16 AM  6CIT Screen  What Year? 0 points   What month? 0 points   What time? 0 points   Count back from 20 0 points   Months in reverse 0 points 0 points  Repeat phrase 0 points   Total Score 0 points     Immunizations Immunization History  Administered Date(s) Administered   Fluad Quad(high Dose 65+) 04/17/2019, 04/26/2020, 04/29/2021, 04/24/2022   Influenza Whole 06/01/2008, 05/06/2012   Influenza, High Dose Seasonal PF 05/12/2015, 04/28/2016, 04/27/2018   Influenza-Unspecified 05/17/2013, 06/10/2017   PFIZER(Purple Top)SARS-COV-2 Vaccination 08/13/2019, 09/03/2019, 03/24/2020   Pfizer Covid-19 Vaccine Bivalent Booster 41yrs & up 04/04/2021   Pfizer Covid-19 Vaccine Bivalent Booster 5y-11y 03/22/2023   Pneumococcal Conjugate-13 11/23/2014   Pneumococcal Polysaccharide-23 05/04/2008, 04/28/2016   Respiratory Syncytial Virus Vaccine,Recomb Aduvanted(Arexvy) 05/21/2022   Td 10/06/2003, 11/14/2005   Tdap 05/02/2011, 06/24/2021   Zoster Recombinant(Shingrix) 01/10/2018, 03/19/2018   Zoster, Live 07/16/2007    TDAP status: Up to date  Flu Vaccine status: Up to date  Pneumococcal vaccine status: Up to date  Covid-19 vaccine status: Completed vaccines  Qualifies for Shingles Vaccine? Yes   Zostavax completed Yes   Shingrix  Completed?: Yes  Screening Tests Health Maintenance  Topic Date Due   COVID-19 Vaccine (6 - 2023-24 season) 05/17/2023   Medicare Annual Wellness (AWV)  05/26/2023   INFLUENZA VACCINE  10/15/2023 (Originally 02/15/2023)   DTaP/Tdap/Td (5 - Td or Tdap) 06/25/2031   Pneumonia Vaccine 47+ Years old  Completed   DEXA SCAN  Completed   Hepatitis C Screening  Completed   Zoster Vaccines- Shingrix  Completed   HPV VACCINES  Aged Out   Colonoscopy  Discontinued    Health Maintenance  Health Maintenance Due  Topic Date Due   COVID-19 Vaccine (6 - 2023-24 season) 05/17/2023   Medicare Annual Wellness (AWV)  05/26/2023    Colorectal cancer screening: No longer required.   Mammogram status: Completed 04/2023. Repeat every year  Bone Density status: Completed 02/2021. Results reflect: Bone density results: OSTEOPENIA. Repeat every 2 years.  Lung Cancer Screening: (Low Dose CT Chest recommended if Age 50-80 years, 20 pack-year currently smoking OR have quit w/in 15years.) does not qualify.     Additional Screening:  Hepatitis C Screening: does qualify; Completed 07/2015  Vision Screening: Recommended annual ophthalmology exams for early detection of glaucoma and other disorders of the eye. Is the patient up to date with their annual eye exam?  Yes  Who is  the provider or what is the name of the office in which the patient attends annual eye exams? [facility] If pt is not established with a provider, would they like to be referred to a provider to establish care? No .   Dental Screening: Recommended annual dental exams for proper oral hygiene   Community Resource Referral / Chronic Care Management: CRR required this visit?  No   CCM required this visit?  No     Plan:     I have personally reviewed and noted the following in the patient's chart:   Medical and social history Use of alcohol, tobacco or illicit drugs  Current medications and supplements including opioid  prescriptions. Patient is not currently taking opioid prescriptions. Functional ability and status Nutritional status Physical activity Advanced directives List of other physicians Hospitalizations, surgeries, and ER visits in previous 12 months Vitals Screenings to include cognitive, depression, and falls Referrals and appointments  In addition, I have reviewed and discussed with patient certain preventive protocols, quality metrics, and best practice recommendations. A written personalized care plan for preventive services as well as general preventive health recommendations were provided to patient.     Sydell Axon, LPN   16/04/9603   After Visit Summary: (MyChart) Due to this being a telephonic visit, the after visit summary with patients personalized plan was offered to patient via MyChart   Nurse Notes: None

## 2023-06-04 NOTE — Patient Instructions (Signed)
Madeline Schaefer , Thank you for taking time to come for your Medicare Wellness Visit. I appreciate your ongoing commitment to your health goals. Please review the following plan we discussed and let me know if I can assist you in the future.   Referrals/Orders/Follow-Ups/Clinician Recommendations: None  This is a list of the screening recommended for you and due dates:  Health Maintenance  Topic Date Due   COVID-19 Vaccine (5 - 2023-24 season) 07/22/2023   Mammogram  04/30/2024   Medicare Annual Wellness Visit  06/03/2024   DTaP/Tdap/Td vaccine (5 - Td or Tdap) 06/25/2031   Pneumonia Vaccine  Completed   Flu Shot  Completed   DEXA scan (bone density measurement)  Completed   Hepatitis C Screening  Completed   Zoster (Shingles) Vaccine  Completed   HPV Vaccine  Aged Out   Colon Cancer Screening  Discontinued    Advanced directives: (Copy Requested) Please bring a copy of your health care power of attorney and living will to the office to be added to your chart at your convenience.  Next Medicare Annual Wellness Visit scheduled for next year: Yes 06/10/24 @ 9:30

## 2023-06-08 ENCOUNTER — Ambulatory Visit: Payer: Medicare PPO | Admitting: Cardiology

## 2023-06-14 ENCOUNTER — Other Ambulatory Visit: Payer: Self-pay | Admitting: Family

## 2023-06-14 DIAGNOSIS — I10 Essential (primary) hypertension: Secondary | ICD-10-CM

## 2023-06-18 ENCOUNTER — Telehealth: Payer: Self-pay

## 2023-06-18 DIAGNOSIS — I10 Essential (primary) hypertension: Secondary | ICD-10-CM

## 2023-06-18 MED ORDER — LOSARTAN POTASSIUM 25 MG PO TABS: ORAL_TABLET | ORAL | 0 refills | Status: DC

## 2023-06-18 NOTE — Telephone Encounter (Signed)
Prescription Request  06/18/2023  LOV: Visit date not found  What is the name of the medication or equipment? losartan (COZAAR) 25 MG tablet  Have you contacted your pharmacy to request a refill? Yes   Which pharmacy would you like this sent to?  [facility] [address] [place] - [address] [place] [address] Phone: [phone] Fax: [phone]    Patient notified that their request is being sent to the clinical staff for review and that they should receive a response within 2 business days.   Please advise at Mobile [phone]  Patient's husband, Assia Prien, called to state patient needs a refill for this medication. Louanne Skye states patient is out of this medication.  Louanne Skye states patient ran out over the holiday and they have been cutting up his pills to try to get the right dose for her.  Dick states patient's pharmacy has reached out to [place] but they have not heard back.

## 2023-06-18 NOTE — Telephone Encounter (Signed)
Medication sent in, pt notified via mychart

## 2023-06-19 ENCOUNTER — Ambulatory Visit (INDEPENDENT_AMBULATORY_CARE_PROVIDER_SITE_OTHER): Payer: Medicare PPO

## 2023-06-19 DIAGNOSIS — I441 Atrioventricular block, second degree: Secondary | ICD-10-CM | POA: Diagnosis not present

## 2023-06-20 ENCOUNTER — Telehealth: Payer: Self-pay

## 2023-06-20 LAB — CUP PACEART REMOTE DEVICE CHECK
Battery Impedance: 2666 Ohm
Battery Remaining Longevity: 24 mo
Battery Voltage: 2.72 V
Brady Statistic AP VP Percent: 63 %
Brady Statistic AP VS Percent: 0 %
Brady Statistic AS VP Percent: 37 %
Brady Statistic AS VS Percent: 0 %
Date Time Interrogation Session: 20241204102314
Implantable Lead Connection Status: 753985
Implantable Lead Connection Status: 753985
Implantable Lead Implant Date: 20040907
Implantable Lead Implant Date: 20040907
Implantable Lead Location: 753859
Implantable Lead Location: 753860
Implantable Lead Model: 5076
Implantable Lead Model: 5076
Implantable Pulse Generator Implant Date: 20130607
Lead Channel Impedance Value: 454 Ohm
Lead Channel Impedance Value: 668 Ohm
Lead Channel Pacing Threshold Amplitude: 0.625 V
Lead Channel Pacing Threshold Amplitude: 0.625 V
Lead Channel Pacing Threshold Pulse Width: 0.4 ms
Lead Channel Pacing Threshold Pulse Width: 0.4 ms
Lead Channel Setting Pacing Amplitude: 2 V
Lead Channel Setting Pacing Amplitude: 2.5 V
Lead Channel Setting Pacing Pulse Width: 0.4 ms
Lead Channel Setting Sensing Sensitivity: 2 mV
Zone Setting Status: 755011
Zone Setting Status: 755011

## 2023-06-20 NOTE — Telephone Encounter (Signed)
Spoke with pt she is aware transmission was normal and verbalized understanding

## 2023-06-20 NOTE — Telephone Encounter (Signed)
Pt called in wanting to know if someone can look at her transmission she is c/o sharp pains in L side of chest earlier today but not hurting at the moment. Pt wants a call back within an hour

## 2023-06-20 NOTE — Telephone Encounter (Signed)
Attempted to contact patient to follow up. No answer, LMTCB.  Remote transmission received and reviewed. Shows normal device function. No episodes noted.

## 2023-06-26 NOTE — Progress Notes (Unsigned)
Cardiology Office Note:   Date:  06/28/2023  ID:  MARDEE Schaefer, DOB [birth year], MRN [id number] PCP: Allegra Grana, FNP  [facility] Providers Cardiologist:  Rollene Rotunda, MD Electrophysiologist:  Lanier Prude, MD {  History of Present Illness:   Madeline Schaefer is a 77 y.o. female who presents for follow up of CAD.   Previously She has a mildly reduced EF and moderate AI.   She has a distant history of stenting to her LAD. In  2011 she had an EF was 40 - 45%. She has a pacemaker.  She was having some dypsnea.    She had a PET scan in 2023.  This demonstrated a small fixed defect with no ischemia .  The EF was 61%.      Since I last saw her she has done okay.  She had 1 episode of sharp chest discomfort.  This was while she was driving home.  Was unlike any previous angina.  It was a few days ago.  It was self-limited.  She is otherwise able to be active.  The patient denies any new symptoms such as chest discomfort, neck or arm discomfort. There has been no new shortness of breath, PND or orthopnea. There have been no reported palpitations, presyncope or syncope.   She has a young doodle who brings her great joy.     ROS: As stated in the HPI and negative for all other systems.   Studies Reviewed:    EKG:   EKG Interpretation Date/Time:  Thursday June 28 2023 10:02:27 EST Ventricular Rate:  65 PR Interval:  200 QRS Duration:  190 QT Interval:  464 QTC Calculation: 482 R Axis:   -84  Text Interpretation: AV dual-paced rhythm When compared with ECG of 24-Mar-2003 23:23, Electronic ventricular pacemaker has replaced Sinus rhythm Confirmed by Rollene Rotunda ([id number]) on 06/28/2023 10:33:33 AM   Risk Assessment/Calculations:        Physical Exam:   VS:  BP 133/81 (BP Location: Left Arm, Patient Position: Sitting, Cuff Size: Normal)   Pulse 65   Ht 5\' 10"  (1.778 m)   Wt 153 lb (69.4 kg)   LMP 10/17/1986   SpO2 99%   BMI 21.95 kg/m    Wt Readings  from Last 3 Encounters:  06/28/23 153 lb (69.4 kg)  06/04/23 150 lb (68 kg)  02/20/23 155 lb 9.6 oz (70.6 kg)     GEN: Well nourished, well developed in no acute distress NECK: No JVD; No carotid bruits CARDIAC: Normal RRR, no murmurs, rubs, gallops RESPIRATORY:  Clear to auscultation without rales, wheezing or rhonchi  ABDOMEN: Soft, non-tender, non-distended EXTREMITIES:  No edema; No deformity, positive for varicose veins on the left leg  ASSESSMENT AND PLAN:   CAD:   She had a negative perfusion study in 2023.  No further workup.  She has no new symptoms.  She will continue with risk reduction.   STATUS POST PACEMAKER:   She is up-to-date with follow-up.  She wants to get all of that follow-up in [place] where she lives.   HTN:      Blood pressure is at target.  No change in therapy.  DYSLIPIDEMIA:    Her LDL is 66 with an HDL of 48.  No change in therapy.    REDUCED EF:   EF was normal on the most recent echo but will be assessed as below.     AI/MR:  There was moderate AI and mild  MR in June 2022.  I will follow this up with an echo.  VARICOSE VEINS: She would like to be referred to [facility] talked about treatment.    Follow up with me in one year.   Signed, Rollene Rotunda, MD

## 2023-06-28 ENCOUNTER — Ambulatory Visit: Payer: Medicare PPO | Attending: Cardiology | Admitting: Cardiology

## 2023-06-28 ENCOUNTER — Encounter: Payer: Self-pay | Admitting: Cardiology

## 2023-06-28 VITALS — BP 133/81 | HR 65 | Ht 70.0 in | Wt 153.0 lb

## 2023-06-28 DIAGNOSIS — E785 Hyperlipidemia, unspecified: Secondary | ICD-10-CM

## 2023-06-28 DIAGNOSIS — I8390 Asymptomatic varicose veins of unspecified lower extremity: Secondary | ICD-10-CM | POA: Diagnosis not present

## 2023-06-28 DIAGNOSIS — I2510 Atherosclerotic heart disease of native coronary artery without angina pectoris: Secondary | ICD-10-CM | POA: Diagnosis not present

## 2023-06-28 DIAGNOSIS — I10 Essential (primary) hypertension: Secondary | ICD-10-CM

## 2023-06-28 DIAGNOSIS — I351 Nonrheumatic aortic (valve) insufficiency: Secondary | ICD-10-CM

## 2023-06-28 NOTE — Patient Instructions (Signed)
Medication Instructions:  No changes.  *If you need a refill on your cardiac medications before your next appointment, please call your pharmacy*   Testing/Procedures:Your physician has requested that you have an echocardiogram. Echocardiography is a painless test that uses sound waves to create images of your heart. It provides your doctor with information about the size and shape of your heart and how well your heart's chambers and valves are working. This procedure takes approximately one hour. There are no restrictions for this procedure. Please do NOT wear cologne, perfume, aftershave, or lotions (deodorant is allowed). Please arrive 15 minutes prior to your appointment time.  [address].   Please note: We ask at that you not bring children with you during ultrasound (echo/ vascular) testing. Due to room size and safety concerns, children are not allowed in the ultrasound rooms during exams. Our front office staff cannot provide observation of children in our lobby area while testing is being conducted. An adult accompanying a patient to their appointment will only be allowed in the ultrasound room at the discretion of the ultrasound technician under special circumstances. We apologize for any inconvenience.    Follow-Up: At [facility], you and your health needs are our priority.  As part of our continuing mission to provide you with exceptional heart care, we have created designated Provider Care Teams.  These Care Teams include your primary Cardiologist (physician) and Advanced Practice Providers (APPs -  Physician Assistants and Nurse Practitioners) who all work together to provide you with the care you need, when you need it.  We recommend signing up for the patient portal called "MyChart".  Sign up information is provided on this After Visit Summary.  MyChart is used to connect with patients for Virtual Visits (Telemedicine).  Patients are able to view lab/test results,  encounter notes, upcoming appointments, etc.  Non-urgent messages can be sent to your provider as well.   To learn more about what you can do with MyChart, go to [email or web address].    Your next appointment:   12 month(s)  Provider:   Rollene Rotunda, MD

## 2023-07-05 ENCOUNTER — Encounter: Payer: Self-pay | Admitting: Family

## 2023-07-05 ENCOUNTER — Ambulatory Visit: Payer: Medicare PPO | Admitting: Family

## 2023-07-05 VITALS — BP 127/78 | HR 72 | Temp 98.0°F | Ht 70.0 in | Wt 151.4 lb

## 2023-07-05 DIAGNOSIS — R197 Diarrhea, unspecified: Secondary | ICD-10-CM | POA: Diagnosis not present

## 2023-07-05 DIAGNOSIS — I10 Essential (primary) hypertension: Secondary | ICD-10-CM

## 2023-07-05 NOTE — Assessment & Plan Note (Signed)
Resolved at this time.  Discussed passage of E. coli in undercooked meat, raw vegetables.  Discussed typically self-limiting, conservative therapy.  Patient will let me know if symptoms were to recur.

## 2023-07-05 NOTE — Progress Notes (Signed)
Assessment & Plan:  Diarrhea, unspecified type Assessment & Plan: Resolved at this time.  Discussed passage of E. coli in undercooked meat, raw vegetables.  Discussed typically self-limiting, conservative therapy.  Patient will let me know if symptoms were to recur.    Essential hypertension Assessment & Plan: Slightly elevated today however home readings under good control.  continue Coreg 3.125 mg twice daily, losartan 25 mg daily.  Advised to take additiona 12.5 mg of losartan for blood pressure greater than 140 /80  she will continue to follow with Dr. Lalla Brothers, Dr Antoine Poche.      Return precautions given.   Risks, benefits, and alternatives of the medications and treatment plan prescribed today were discussed, and patient expressed understanding.   Education regarding symptom management and diagnosis given to patient on AVS either electronically or printed.  Return in about 3 months (around 10/03/2023).  Rennie Plowman, FNP  Subjective:    Patient ID: Madeline Schaefer, female    DOB: 05/07/1946, 77 y.o.   MRN: [id number]  CC: Madeline Schaefer is a 77 y.o. female who presents today for an acute visit.    HPI: Concerned for symptoms for e coli exposure by husband.   She had chili and BBQ and reports a couple episodes loose with some formed stool afterwards, 3-4 days ago. Nonbloody stool.   Symptoms completely resolved  Denies abdominal pain, fever  She washes fruit and vegetables No recent travel      She remains compliant with carvedilol 3.125 mg twice daily, losartan 25 mg.  Denies chest pain BP at home 127/78  Cardiology BP 06/28/23  133/81.  She reports blood pressure is higher in the doctor's office.  Allergies: Bee venom, Codeine, Pneumovax 23 [pneumococcal vac polyvalent], Lisinopril, Hornet venom, Sulfa antibiotics, and Sulfonamide derivatives Current Outpatient Medications on File Prior to Visit  Medication Sig Dispense Refill   aspirin EC 81 MG  tablet Take 81 mg by mouth daily.     atorvastatin (LIPITOR) 80 MG tablet Take 1 tablet (80 mg total) by mouth daily. 90 tablet 4   carvedilol (COREG) 3.125 MG tablet Take 1 tablet (3.125 mg total) by mouth 2 (two) times daily with a meal. 180 tablet 2   clobetasol ointment (TEMOVATE) 0.05 % Apply 1 application topically daily as needed (eczema).      clotrimazole-betamethasone (LOTRISONE) cream Apply 1 application topically 2 (two) times daily. 30 g 0   EPINEPHrine 0.3 mg/0.3 mL IJ SOAJ injection Inject 0.3 mLs (0.3 mg total) into the muscle once. 2 Device 1   fexofenadine (ALLEGRA) 180 MG tablet      ibuprofen (ADVIL) 200 MG tablet Take 2 tablets (400 mg total) by mouth every 8 (eight) hours as needed for moderate pain (with food). 30 tablet 0   levothyroxine (SYNTHROID) 88 MCG tablet Take 1 tablet by mouth once daily 90 tablet 3   losartan (COZAAR) 25 MG tablet TAKE 1 TABLET BY MOUTH ONCE DAILY TAKE  AN  ADDITIONAL  1/2  (ONE-HALF)  TAB  IF  BP  GREATER  THAN  140/80 90 tablet 0   No current facility-administered medications on file prior to visit.    Review of Systems  Constitutional:  Negative for chills and fever.  Respiratory:  Negative for cough.   Cardiovascular:  Negative for chest pain and palpitations.  Gastrointestinal:  Negative for constipation, diarrhea (resolved), nausea and vomiting.      Objective:    BP (!) 142/84 (BP Location: Left Arm,  Patient Position: Sitting, Cuff Size: Normal)   Pulse 72   Temp 98 F (36.7 C) (Oral)   Ht 5\' 10"  (1.778 m)   Wt 151 lb 6.4 oz (68.7 kg)   LMP 10/17/1986   SpO2 100%   BMI 21.72 kg/m   BP Readings from Last 3 Encounters:  07/05/23 (!) 142/84  06/28/23 133/81  02/20/23 130/60   Wt Readings from Last 3 Encounters:  07/05/23 151 lb 6.4 oz (68.7 kg)  06/28/23 153 lb (69.4 kg)  06/04/23 150 lb (68 kg)    Physical Exam Vitals reviewed.  Constitutional:      Appearance: She is well-developed.  Eyes:      Conjunctiva/sclera: Conjunctivae normal.  Cardiovascular:     Rate and Rhythm: Normal rate and regular rhythm.     Pulses: Normal pulses.     Heart sounds: Normal heart sounds.  Pulmonary:     Effort: Pulmonary effort is normal.     Breath sounds: Normal breath sounds. No wheezing, rhonchi or rales.  Skin:    General: Skin is warm and dry.  Neurological:     Mental Status: She is alert.  Psychiatric:        Speech: Speech normal.        Behavior: Behavior normal.        Thought Content: Thought content normal.

## 2023-07-05 NOTE — Assessment & Plan Note (Signed)
Slightly elevated today however home readings under good control.  continue Coreg 3.125 mg twice daily, losartan 25 mg daily.  Advised to take additiona 12.5 mg of losartan for blood pressure greater than 140 /80  she will continue to follow with Dr. Lalla Brothers, Dr Antoine Poche.

## 2023-07-19 DIAGNOSIS — L308 Other specified dermatitis: Secondary | ICD-10-CM | POA: Diagnosis not present

## 2023-07-19 DIAGNOSIS — L821 Other seborrheic keratosis: Secondary | ICD-10-CM | POA: Diagnosis not present

## 2023-07-19 DIAGNOSIS — D485 Neoplasm of uncertain behavior of skin: Secondary | ICD-10-CM | POA: Diagnosis not present

## 2023-07-19 DIAGNOSIS — L570 Actinic keratosis: Secondary | ICD-10-CM | POA: Diagnosis not present

## 2023-07-19 DIAGNOSIS — R21 Rash and other nonspecific skin eruption: Secondary | ICD-10-CM | POA: Diagnosis not present

## 2023-07-19 DIAGNOSIS — L408 Other psoriasis: Secondary | ICD-10-CM | POA: Diagnosis not present

## 2023-07-23 DIAGNOSIS — Z0100 Encounter for examination of eyes and vision without abnormal findings: Secondary | ICD-10-CM | POA: Diagnosis not present

## 2023-07-23 DIAGNOSIS — Z961 Presence of intraocular lens: Secondary | ICD-10-CM | POA: Diagnosis not present

## 2023-07-23 DIAGNOSIS — H43813 Vitreous degeneration, bilateral: Secondary | ICD-10-CM | POA: Diagnosis not present

## 2023-07-31 ENCOUNTER — Ambulatory Visit: Payer: Medicare PPO | Attending: Cardiology

## 2023-07-31 ENCOUNTER — Other Ambulatory Visit: Payer: Self-pay | Admitting: Family

## 2023-07-31 DIAGNOSIS — Z1231 Encounter for screening mammogram for malignant neoplasm of breast: Secondary | ICD-10-CM

## 2023-07-31 DIAGNOSIS — I351 Nonrheumatic aortic (valve) insufficiency: Secondary | ICD-10-CM | POA: Diagnosis not present

## 2023-07-31 LAB — ECHOCARDIOGRAM COMPLETE
AR max vel: 2.99 cm2
AV Area VTI: 3 cm2
AV Area mean vel: 2.73 cm2
AV Mean grad: 6 mm[Hg]
AV Peak grad: 12.1 mm[Hg]
AV Vena cont: 0.6 cm
Ao pk vel: 1.74 m/s
Area-P 1/2: 2.69 cm2
Calc EF: 56.1 %
P 1/2 time: 571 ms
S' Lateral: 3 cm
Single Plane A2C EF: 53.9 %
Single Plane A4C EF: 55.7 %

## 2023-08-01 ENCOUNTER — Other Ambulatory Visit: Payer: Self-pay | Admitting: *Deleted

## 2023-08-01 DIAGNOSIS — I8393 Asymptomatic varicose veins of bilateral lower extremities: Secondary | ICD-10-CM

## 2023-08-02 ENCOUNTER — Ambulatory Visit: Payer: Self-pay | Admitting: Nurse Practitioner

## 2023-08-06 ENCOUNTER — Ambulatory Visit (HOSPITAL_COMMUNITY)
Admission: RE | Admit: 2023-08-06 | Discharge: 2023-08-06 | Disposition: A | Discharge status: Home / Self Care | Payer: Medicare PPO | Source: Ambulatory Visit | Attending: Surgery | Admitting: Surgery

## 2023-08-06 ENCOUNTER — Encounter: Payer: Self-pay | Admitting: *Deleted

## 2023-08-06 DIAGNOSIS — I8393 Asymptomatic varicose veins of bilateral lower extremities: Secondary | ICD-10-CM | POA: Diagnosis not present

## 2023-08-07 ENCOUNTER — Telehealth: Payer: Self-pay

## 2023-08-07 NOTE — Telephone Encounter (Signed)
Copied from CRM [id number]. Topic: Clinical - Medication Question >> Aug 07, 2023 10:48 AM Sonny Dandy B wrote: Reason for CRM: pt called to ask provider for a foot doctor, the best doctor  for feet in , or please call [phone] or [phone]

## 2023-08-08 NOTE — Telephone Encounter (Signed)
Call patient I recommend [facility] at [facility]

## 2023-08-09 ENCOUNTER — Ambulatory Visit: Payer: Self-pay | Admitting: Nurse Practitioner

## 2023-08-09 NOTE — Telephone Encounter (Signed)
LVM to inform pt that per Madeline Schaefer recommend [facility] at [facility]

## 2023-08-13 NOTE — Progress Notes (Unsigned)
[facility] [place]  ASSESSMENT / PLAN: Madeline Schaefer is a 78 y.o. female with chronic venous insufficiency of left lower extremity causing reticular veins Venous duplex is significant for no saphenous vein reflux. Recommend compression and elevation for symptomatic relief. Patient encouraged to discuss sclerotherapy with our sclerotherapy nurse. Follow up with me as needed.  CHIEF COMPLAINT: prominent veins  HISTORY OF PRESENT ILLNESS: Madeline Schaefer is a 78 y.o. female who presents to clinic for evaluation of prominent veins over the left shin.  The patient does not report any significant swelling or discomfort from these.  She has not had any spontaneous bleeding from this area.  She is not satisfied with the appearance of these.  We reviewed her venous duplex in detail.  We reviewed the options for treatment in detail.   Past Medical History:  Diagnosis Date   Aortic insufficiency    a. 12/2020 Echo: EF 55-60%, no rwma, nl RV fxn, RVSP 31.30mmHg, mild MR, mod TR, mild Ao sclerosis. Mod AI. Mod dil PA.   Aortic valve sclerosis    Blood transfusion without reported diagnosis    BRONCHITIS    CAD (coronary artery disease)    a. 08/2001 PCI: LAD 87m (2.75x13 Zeta BMS); b. 12/2009 Cath: LM nl, LAD <30ISR, RI nl, LCX nl, RCA nl, EF nl; c. 01/2017 MV: small apical inf defect w/o ischemia. EF 59%; d. 11/2021 Lexi PET/CT: EF 56% (61% w/ stress), small fixed apical/inf defect 2/2 PPM (artifact). No ischemia-->low risk.   Cardiac pacemaker in situ    a. 2004 s/p PPM (Brodie); b. 12/2011 Gen Change (Allred): MD Adapta L ADDRL1 DC PPM (ser # [id number]).   Cataract    DYSPNEA    HLD (hyperlipidemia)    Hypothyroidism    Mobitz type 2 second degree heart block    a. 2004 s/p PPM (Brodie); b. 12/2011 Gen Change (Allred): MD Adapta L ADDRL1 DC PPM (ser # [id number]).   Osteopenia    Tricuspid regurgitation     Past Surgical History:  Procedure Laterality Date   CARDIAC  CATHETERIZATION     CYSTOSCOPY WITH RETROGRADE PYELOGRAM, URETEROSCOPY AND STENT PLACEMENT  08/19/2012   Procedure: CYSTOSCOPY WITH RETROGRADE PYELOGRAM, URETEROSCOPY AND STENT PLACEMENT;  Surgeon: Milford Cage, MD;  Location: [facility];  Service: Urology;  Laterality: Right;   MYOMECTOMY     PACEMAKER GENERATOR CHANGE N/A 12/22/2011   Procedure: PACEMAKER GENERATOR CHANGE;  Surgeon: Hillis Range, MD;  [facility];  Service: Cardiovascular;  Laterality: N/A;   PACEMAKER INSERTION  2004, 12/22/11   initial implant by Dr Juanda Chance, Generator change (MDT Adapta L) by Dr Johney Frame 6/13   TOTAL ABDOMINAL HYSTERECTOMY W/ BILATERAL SALPINGOOPHORECTOMY      Family History  Problem Relation Age of Onset   Heart attack Mother        died at age 42   Breast cancer Mother        age 48, had mastectomy.    Prostate cancer Father    Colon cancer Neg Hx    Esophageal cancer Neg Hx    Rectal cancer Neg Hx     Social History   Socioeconomic History   Marital status: Married    Spouse name: Not on file   Number of children: 0   Years of education: Not on file   Highest education level: Not on file  Occupational History   Occupation: retired  Tobacco Use   Smoking status:  Never   Smokeless tobacco: Never  Vaping Use   Vaping status: Never Used  Substance and Sexual Activity   Alcohol use: Yes    Comment: 1-2 drink daily   Drug use: No   Sexual activity: Yes  Other Topics Concern   Not on file  Social History Narrative   Retired Field seismologist   Enjoys RV, bikes   No children   No pets   Social Drivers of Corporate investment banker Strain: Low Risk  (06/04/2023)   Overall Financial Resource Strain (CARDIA)    Difficulty of Paying Living Expenses: Not hard at all  Food Insecurity: No Food Insecurity (06/04/2023)   Hunger Vital Sign    Worried About Running Out of Food in the Last Year: Never true    Ran Out of Food in the Last Year: Never true   Transportation Needs: No Transportation Needs (06/04/2023)   PRAPARE - Administrator, Civil Service (Medical): No    Lack of Transportation (Non-Medical): No  Physical Activity: Sufficiently Active (06/04/2023)   Exercise Vital Sign    Days of Exercise per Week: 5 days    Minutes of Exercise per Session: 60 min  Stress: No Stress Concern Present (06/04/2023)   [employer] of Occupational Health - Occupational Stress Questionnaire    Feeling of Stress : Only a little  Social Connections: Moderately Integrated (06/04/2023)   Social Connection and Isolation Panel [NHANES]    Frequency of Communication with Friends and Family: More than three times a week    Frequency of Social Gatherings with Friends and Family: More than three times a week    Attends Religious Services: Never    Database administrator or Organizations: Yes    Attends Engineer, structural: More than 4 times per year    Marital Status: Married  Catering manager Violence: Not At Risk (06/04/2023)   Humiliation, Afraid, Rape, and Kick questionnaire    Fear of Current or Ex-Partner: No    Emotionally Abused: No    Physically Abused: No    Sexually Abused: No    Allergies  Allergen Reactions   Bee Venom Anaphylaxis   Codeine Other (See Comments) and Rash    REACTION: Passed  out, facial swelling and hives   Pneumovax 23 [Pneumococcal Vac Polyvalent] Swelling    Local swelling and pain   Lisinopril     COUGH   Hornet Venom Hives    Hives over entire body   Sulfa Antibiotics Hives   Sulfonamide Derivatives Hives    Current Outpatient Medications  Medication Sig Dispense Refill   aspirin EC 81 MG tablet Take 81 mg by mouth daily.     atorvastatin (LIPITOR) 80 MG tablet Take 1 tablet (80 mg total) by mouth daily. 90 tablet 4   carvedilol (COREG) 3.125 MG tablet Take 1 tablet (3.125 mg total) by mouth 2 (two) times daily with a meal. 180 tablet 2   clobetasol ointment (TEMOVATE) 0.05 %  Apply 1 application topically daily as needed (eczema).      clotrimazole-betamethasone (LOTRISONE) cream Apply 1 application topically 2 (two) times daily. 30 g 0   EPINEPHrine 0.3 mg/0.3 mL IJ SOAJ injection Inject 0.3 mLs (0.3 mg total) into the muscle once. 2 Device 1   fexofenadine (ALLEGRA) 180 MG tablet      ibuprofen (ADVIL) 200 MG tablet Take 2 tablets (400 mg total) by mouth every 8 (eight) hours as needed for moderate pain (  with food). 30 tablet 0   levothyroxine (SYNTHROID) 88 MCG tablet Take 1 tablet by mouth once daily 90 tablet 3   losartan (COZAAR) 25 MG tablet TAKE 1 TABLET BY MOUTH ONCE DAILY TAKE  AN  ADDITIONAL  1/2  (ONE-HALF)  TAB  IF  BP  GREATER  THAN  140/80 90 tablet 0   No current facility-administered medications for this visit.    PHYSICAL EXAM Vitals:   08/14/23 1536  BP: 113/70  Pulse: 71  Resp: 20  Temp: 98.2 F (36.8 C)  SpO2: 98%  Weight: 152 lb (68.9 kg)  Height: 5\' 10"  (1.778 m)        PERTINENT LABORATORY AND RADIOLOGIC DATA  Most recent CBC    Latest Ref Rng & Units 02/20/2023   11:17 AM 07/04/2022    2:16 PM 03/15/2022    8:11 AM  CBC  WBC 4.0 - 10.5 K/uL 4.8  7.1  4.2   Hemoglobin 12.0 - 15.0 g/dL 16.1  09.6  04.5   Hematocrit 36.0 - 46.0 % 41.5  41.2  38.9   Platelets 150.0 - 400.0 K/uL 245.0  266.0  197.0      Most recent CMP    Latest Ref Rng & Units 02/20/2023   11:17 AM 07/04/2022    2:16 PM 05/30/2022    9:50 AM  CMP  Glucose 70 - 99 mg/dL 97  84  88   BUN 6 - 23 mg/dL 19  22  17    Creatinine 0.40 - 1.20 mg/dL 4.09  8.11  9.14   Sodium 135 - 145 mEq/L 138  137  137   Potassium 3.5 - 5.1 mEq/L 4.2  3.8  4.4   Chloride 96 - 112 mEq/L 102  102  101   CO2 19 - 32 mEq/L 27  29  31    Calcium 8.4 - 10.5 mg/dL 9.3  9.0  8.8   Total Protein 6.0 - 8.3 g/dL 7.3  7.2  6.8   Total Bilirubin 0.2 - 1.2 mg/dL 1.0  0.5  0.8   Alkaline Phos 39 - 117 U/L 63  71  69   AST 0 - 37 U/L 18  14  18    ALT 0 - 35 U/L 18  17  25      Renal  function CrCl cannot be calculated (Patient's most recent lab result is older than the maximum 21 days allowed.).  Hgb A1c MFr Bld (%)  Date Value  02/20/2023 6.3    LDL Cholesterol (Calc)  Date Value Ref Range Status  05/27/2021 73 mg/dL (calc) Final    Comment:    Reference range: <100 . Desirable range <100 mg/dL for primary prevention;   <70 mg/dL for patients with CHD or diabetic patients  with > or = 2 CHD risk factors. Marland Kitchen LDL-C is now calculated using the Martin-Hopkins  calculation, which is a validated novel method providing  better accuracy than the Friedewald equation in the  estimation of LDL-C.  Horald Pollen et al. Lenox Ahr. 7829;562(13): 2061-2068  (http://education.[facility].com/faq/FAQ164)    LDL Cholesterol  Date Value Ref Range Status  02/20/2023 66 0 - 99 mg/dL Final    Left:  - No evidence of deep vein thrombosis from the common femoral through the  popliteal veins.  - Focal, chronic superficial venous thrombosis in the proximal small  saphenous vein.  - The deep venous system is competent.  - The great and small saphenous veins are competent.    *See  table(s) above for measurements and observations.   Rande Brunt. Lenell Antu, MD [facility] Phone Number: [phone] 08/15/2023 4:10 PM   Total time spent on preparing this encounter including chart review, data review, collecting history, examining the patient, coordinating care for this new patient, 45 minutes.  Portions of this report may have been transcribed using voice recognition software.  Every effort has been made to ensure accuracy; however, inadvertent computerized transcription errors may still be present.

## 2023-08-13 NOTE — Telephone Encounter (Signed)
LVM  and sent my chart message as well to inform pt that per Deirdre Priest recommend [facility] at [facility]

## 2023-08-14 ENCOUNTER — Ambulatory Visit: Payer: Medicare PPO | Admitting: Vascular Surgery

## 2023-08-14 ENCOUNTER — Encounter: Payer: Self-pay | Admitting: Vascular Surgery

## 2023-08-14 VITALS — BP 113/70 | HR 71 | Temp 98.2°F | Resp 20 | Ht 70.0 in | Wt 152.0 lb

## 2023-08-14 DIAGNOSIS — I872 Venous insufficiency (chronic) (peripheral): Secondary | ICD-10-CM

## 2023-08-16 NOTE — Telephone Encounter (Signed)
Pt seen mychart message

## 2023-08-20 ENCOUNTER — Encounter: Payer: Self-pay | Admitting: Family

## 2023-08-21 ENCOUNTER — Ambulatory Visit: Payer: Medicare PPO | Admitting: Family

## 2023-08-22 DIAGNOSIS — J3489 Other specified disorders of nose and nasal sinuses: Secondary | ICD-10-CM | POA: Diagnosis not present

## 2023-08-24 ENCOUNTER — Telehealth: Payer: Self-pay

## 2023-08-24 NOTE — Telephone Encounter (Signed)
 Attempted to reach pt to discuss sclerotherapy. LVM for her to call us  back at her convenience.

## 2023-08-29 ENCOUNTER — Encounter: Payer: Self-pay | Admitting: Family

## 2023-08-29 NOTE — Progress Notes (Unsigned)
   Assessment & Plan:  There are no diagnoses linked to this encounter.   Return precautions given.   Risks, benefits, and alternatives of the medications and treatment plan prescribed today were discussed, and patient expressed understanding.   Education regarding symptom management and diagnosis given to patient on AVS either electronically or printed.  No follow-ups on file.  Rennie Plowman, FNP  Subjective:    Patient ID: Madeline Schaefer, female    DOB: 03-21-1946, 78 y.o.   MRN: [id number]  CC: Madeline Schaefer is a 78 y.o. female who presents today for follow up.   HPI: Discuss anxiety , depression  She has taken melatonin as needed .  She has trouble    Previously on lexapro  Screen MMSE  Alcohol use, vodka tonic     EKG  06/2023 QTc   She follows with cardiology, Dr. Antoine Poche for history of CAD, mildly reduced ejection fraction, moderate AI S/p pace maker Allergies: Bee venom, Codeine, Pneumovax 23 [pneumococcal vac polyvalent], Lisinopril, Hornet venom, Sulfa antibiotics, and Sulfonamide derivatives Current Outpatient Medications on File Prior to Visit  Medication Sig Dispense Refill   aspirin EC 81 MG tablet Take 81 mg by mouth daily.     atorvastatin (LIPITOR) 80 MG tablet Take 1 tablet (80 mg total) by mouth daily. 90 tablet 4   carvedilol (COREG) 3.125 MG tablet Take 1 tablet (3.125 mg total) by mouth 2 (two) times daily with a meal. 180 tablet 2   clobetasol ointment (TEMOVATE) 0.05 % Apply 1 application topically daily as needed (eczema).      clotrimazole-betamethasone (LOTRISONE) cream Apply 1 application topically 2 (two) times daily. 30 g 0   EPINEPHrine 0.3 mg/0.3 mL IJ SOAJ injection Inject 0.3 mLs (0.3 mg total) into the muscle once. 2 Device 1   fexofenadine (ALLEGRA) 180 MG tablet      ibuprofen (ADVIL) 200 MG tablet Take 2 tablets (400 mg total) by mouth every 8 (eight) hours as needed for moderate pain (with food). 30 tablet 0    levothyroxine (SYNTHROID) 88 MCG tablet Take 1 tablet by mouth once daily 90 tablet 3   losartan (COZAAR) 25 MG tablet TAKE 1 TABLET BY MOUTH ONCE DAILY TAKE  AN  ADDITIONAL  1/2  (ONE-HALF)  TAB  IF  BP  GREATER  THAN  140/80 90 tablet 0   No current facility-administered medications on file prior to visit.    Review of Systems    Objective:    BP 126/82   Pulse 73   Temp 97.7 F (36.5 C) (Oral)   Ht 5\' 10"  (1.778 m)   Wt 152 lb 12.8 oz (69.3 kg)   LMP 10/17/1986   SpO2 99%   BMI 21.92 kg/m  BP Readings from Last 3 Encounters:  09/11/23 126/82  08/14/23 113/70  07/05/23 127/78   Wt Readings from Last 3 Encounters:  09/11/23 152 lb 12.8 oz (69.3 kg)  08/14/23 152 lb (68.9 kg)  07/05/23 151 lb 6.4 oz (68.7 kg)    Physical Exam

## 2023-09-07 ENCOUNTER — Ambulatory Visit (INDEPENDENT_AMBULATORY_CARE_PROVIDER_SITE_OTHER): Payer: Self-pay

## 2023-09-07 DIAGNOSIS — I8393 Asymptomatic varicose veins of bilateral lower extremities: Secondary | ICD-10-CM

## 2023-09-07 NOTE — Progress Notes (Signed)
Treated pt's LLE reticular vein with Asclera 1% administered with a 27 gauge butterfly needle. Pt received a total of 2 mL/20 mg of Asclera 1%. She tolerated it well. Easy access. We discussed possibly doing another treatment in the spring, in a couple of months. Pt was given post treatment care instructions on handout and verbally. She was put in knee high 20-30 mm Hg compression hose at the end of treatment and given post treatment care instructions on handout and verbally.  Pt will call us if she has any questions/concerns.

## 2023-09-11 ENCOUNTER — Encounter: Payer: Self-pay | Admitting: Family

## 2023-09-11 ENCOUNTER — Ambulatory Visit: Payer: Medicare PPO | Admitting: Family

## 2023-09-11 VITALS — BP 126/82 | HR 73 | Temp 97.7°F | Ht 70.0 in | Wt 152.8 lb

## 2023-09-11 DIAGNOSIS — F4323 Adjustment disorder with mixed anxiety and depressed mood: Secondary | ICD-10-CM | POA: Diagnosis not present

## 2023-09-11 DIAGNOSIS — R7309 Other abnormal glucose: Secondary | ICD-10-CM

## 2023-09-11 LAB — POCT GLYCOSYLATED HEMOGLOBIN (HGB A1C): Hemoglobin A1C: 5.8 % — AB (ref 4.0–5.6)

## 2023-09-11 MED ORDER — ESCITALOPRAM OXALATE 5 MG PO TABS: 5.0000 mg | ORAL_TABLET | Freq: Every day | ORAL | 3 refills | Status: DC

## 2023-09-11 NOTE — Patient Instructions (Signed)
 For sleeping, we will resume Lexapro 5 mg.  If you do not find that your sleep, anxiety is improved, please schedule a sooner appointment with me.  You may continue melatonin 5 mg as well.

## 2023-09-13 ENCOUNTER — Telehealth: Payer: Self-pay | Admitting: Family

## 2023-09-13 NOTE — Assessment & Plan Note (Signed)
 Chronic, suboptimal control.  Reviewed prior EKG, QTc 482.  Staff message to Dr. Antoine Poche regarding lexapro 5mg 

## 2023-09-13 NOTE — Telephone Encounter (Signed)
-----   Message from Rollene Rotunda sent at 09/13/2023  4:51 PM EST ----- Can we get a repeat EKG before starting? ----- Message ----- From: Allegra Grana, FNP Sent: 09/13/2023   2:21 PM EST To: Rollene Rotunda, MD  Dr Antoine Poche.   Hope you are doing well.  I just wanted to run this by you.   I wanted to restart Lexapro 5 mg for patient for anxiety while closely monitoring QTc. Last Qtc 482 06/2023   I plan to repeat EKG in 6 weeks time as long okay with you to start low dose lexapro.  She had stopped the medication at some point last year when she established care with me.  She had been on it for couple years prior.   Jaymes Graff, NP

## 2023-09-13 NOTE — Telephone Encounter (Signed)
 Call pt  I consulted with cardiology.  I would like for her to hold on starting Lexapro until we obtain a new baseline EKG.  Is she available to come on Monday morning for EKG via nurse visit?

## 2023-09-18 ENCOUNTER — Ambulatory Visit: Payer: Medicare PPO

## 2023-09-18 DIAGNOSIS — I441 Atrioventricular block, second degree: Secondary | ICD-10-CM | POA: Diagnosis not present

## 2023-09-19 DIAGNOSIS — H0289 Other specified disorders of eyelid: Secondary | ICD-10-CM | POA: Diagnosis not present

## 2023-09-20 ENCOUNTER — Ambulatory Visit: Payer: Medicare PPO

## 2023-09-21 ENCOUNTER — Ambulatory Visit

## 2023-09-21 LAB — CUP PACEART REMOTE DEVICE CHECK
Battery Impedance: 2930 Ohm
Battery Remaining Longevity: 21 mo
Battery Voltage: 2.72 V
Brady Statistic AP VP Percent: 62 %
Brady Statistic AP VS Percent: 0 %
Brady Statistic AS VP Percent: 38 %
Brady Statistic AS VS Percent: 0 %
Date Time Interrogation Session: 20250306164850
Implantable Lead Connection Status: 753985
Implantable Lead Connection Status: 753985
Implantable Lead Implant Date: 20040907
Implantable Lead Implant Date: 20040907
Implantable Lead Location: 753859
Implantable Lead Location: 753860
Implantable Lead Model: 5076
Implantable Lead Model: 5076
Implantable Pulse Generator Implant Date: 20130607
Lead Channel Impedance Value: 417 Ohm
Lead Channel Impedance Value: 641 Ohm
Lead Channel Pacing Threshold Amplitude: 0.625 V
Lead Channel Pacing Threshold Amplitude: 0.625 V
Lead Channel Pacing Threshold Pulse Width: 0.4 ms
Lead Channel Pacing Threshold Pulse Width: 0.4 ms
Lead Channel Setting Pacing Amplitude: 2 V
Lead Channel Setting Pacing Amplitude: 2.5 V
Lead Channel Setting Pacing Pulse Width: 0.4 ms
Lead Channel Setting Sensing Sensitivity: 2 mV
Zone Setting Status: 755011
Zone Setting Status: 755011

## 2023-09-24 NOTE — Telephone Encounter (Signed)
 LVM to inform pt of message below. Please feel free to relay message to pt and document:  I do not see that patient came in for follow-up EKG.  If she would like to resume Lexapro, we need to monitor a certain EKG interval (QT).  If she does not want to resume Lexapro, she does not have to return for EKG  If you reschedule EKG please confirm with me that I AM HERE in office that day (not on PAL) as I want to ensure I review

## 2023-09-25 NOTE — Telephone Encounter (Signed)
 LVM to inform pt of message below. Please feel free to relay message to pt and document:  I do not see that patient came in for follow-up EKG.  If she would like to resume Lexapro, we need to monitor a certain EKG interval (QT).  If she does not want to resume Lexapro, she does not have to return for EKG  If you reschedule EKG please confirm with me that I AM HERE in office that day (not on PAL) as I want to ensure I review

## 2023-09-26 NOTE — Telephone Encounter (Signed)
 Called pt to inform her of below message but someone picked up phone and  phone was disconnected twice. If pt calls back . Please feel free to relay message to pt and document:   I do not see that patient came in for follow-up EKG.  If she would like to resume Lexapro, we need to monitor a certain EKG interval (QT).  If she does not want to resume Lexapro, she does not have to return for EKG  If you reschedule EKG please confirm with me that I AM HERE in office that day (not on PAL) as I want to ensure I review

## 2023-09-27 ENCOUNTER — Encounter: Payer: Self-pay | Admitting: Cardiology

## 2023-09-27 NOTE — Telephone Encounter (Signed)
 Pt has been scheduled for EKG on 10/04/23

## 2023-10-01 ENCOUNTER — Telehealth: Payer: Self-pay

## 2023-10-01 NOTE — Telephone Encounter (Signed)
 Pt called to let me know she is feeling a firmness over some of the large varicose vein that was treated about a month ago with sclerotherapy. No swelling, pain, redness. What she is describing all sounds normal. Explained the firmness can take months to slowly improve. I offered for her to come in this week for me to take a look at it and she declined. She will call us back should anything change. She is happy with the appearance and feels it is much improved.

## 2023-10-04 ENCOUNTER — Ambulatory Visit: Admitting: Family

## 2023-10-11 ENCOUNTER — Encounter: Payer: Self-pay | Admitting: Family

## 2023-10-11 ENCOUNTER — Ambulatory Visit: Admitting: Family

## 2023-10-11 VITALS — BP 124/80 | HR 68 | Temp 97.9°F | Ht 70.0 in | Wt 150.8 lb

## 2023-10-11 DIAGNOSIS — I255 Ischemic cardiomyopathy: Secondary | ICD-10-CM

## 2023-10-11 DIAGNOSIS — F4323 Adjustment disorder with mixed anxiety and depressed mood: Secondary | ICD-10-CM | POA: Diagnosis not present

## 2023-10-11 NOTE — Assessment & Plan Note (Addendum)
 Predominant anxiety anxiety, which is also affecting sleep.   EKG obtained today without significant changes when compared to  We discussed buspar which would I favor over lexapro d/t QT prolongation. Reviewed atarax however can cause QT prolongation.   Collaborated with Dr Antoine Poche, cardiology, through staff message in which he stated QT is not prolonged due to paced rhythm. Okay to use most effective medication. Opted to trial buspar 5mg  BID scheduled for now. Consider adjunct with SSRI. Telephone note to patient.

## 2023-10-11 NOTE — Progress Notes (Signed)
 Assessment & Plan:  Adjustment disorder with mixed anxiety and depressed mood Assessment & Plan: Predominant anxiety anxiety, which is also affecting sleep.   EKG obtained today without significant changes when compared to  We discussed buspar which would I favor over lexapro d/t QT prolongation. Reviewed atarax however can cause QT prolongation.   Collaborated with Dr Antoine Poche, cardiology, through staff message in which he stated QT is not prolonged due to paced rhythm. Okay to use most effective medication. Opted to trial buspar 5mg  BID scheduled for now. Consider adjunct with SSRI. Telephone note to patient.      Orders: -     EKG 12-Lead  Ischemic cardiomyopathy -     EKG 12-Lead     Return precautions given.   Risks, benefits, and alternatives of the medications and treatment plan prescribed today were discussed, and patient expressed understanding.   Education regarding symptom management and diagnosis given to patient on AVS either electronically or printed.  No follow-ups on file.  Rennie Plowman, FNP  Subjective:    Patient ID: Madeline Schaefer, female    DOB: 26-Nov-1945, 78 y.o.   MRN: [id number]  CC: Madeline Schaefer is a 78 y.o. female who presents today for follow up.   HPI: Here today for baseline EKG,  anxiety and insominia     She took half a dose of trazodone last night ( husbands) which was helpful going to sleep.  Endorses increased anxiety and irritability.  Denies depression.  Previoussy on Lexapro 5 mg.Previous EKG 06/28/23  Follows with Dr. Antoine Poche  Mobitz 2 heart block status post permanent pacemaker ; device interrogation 09/20/23  H/o stent of LAD   Allergies: Bee venom, Codeine, Pneumovax 23 [pneumococcal vac polyvalent], Lisinopril, Hornet venom, Sulfa antibiotics, and Sulfonamide derivatives Current Outpatient Medications on File Prior to Visit  Medication Sig Dispense Refill   aspirin EC 81 MG tablet Take 81 mg by mouth daily.      atorvastatin (LIPITOR) 80 MG tablet Take 1 tablet (80 mg total) by mouth daily. 90 tablet 4   carvedilol (COREG) 3.125 MG tablet Take 1 tablet (3.125 mg total) by mouth 2 (two) times daily with a meal. 180 tablet 2   clobetasol ointment (TEMOVATE) 0.05 % Apply 1 application topically daily as needed (eczema).      clotrimazole-betamethasone (LOTRISONE) cream Apply 1 application topically 2 (two) times daily. 30 g 0   EPINEPHrine 0.3 mg/0.3 mL IJ SOAJ injection Inject 0.3 mLs (0.3 mg total) into the muscle once. 2 Device 1   fexofenadine (ALLEGRA) 180 MG tablet      ibuprofen (ADVIL) 200 MG tablet Take 2 tablets (400 mg total) by mouth every 8 (eight) hours as needed for moderate pain (with food). 30 tablet 0   levothyroxine (SYNTHROID) 88 MCG tablet Take 1 tablet by mouth once daily 90 tablet 3   losartan (COZAAR) 25 MG tablet TAKE 1 TABLET BY MOUTH ONCE DAILY TAKE  AN  ADDITIONAL  1/2  (ONE-HALF)  TAB  IF  BP  GREATER  THAN  140/80 90 tablet 0   No current facility-administered medications on file prior to visit.    Review of Systems  Constitutional:  Negative for chills and fever.  Respiratory:  Negative for cough.   Cardiovascular:  Negative for chest pain and palpitations.  Gastrointestinal:  Negative for nausea and vomiting.  Psychiatric/Behavioral:  Positive for sleep disturbance. Negative for suicidal ideas. The patient is nervous/anxious.       Objective:  BP 124/80   Pulse 68   Temp 97.9 F (36.6 C) (Oral)   Ht 5\' 10"  (1.778 m)   Wt 150 lb 12.8 oz (68.4 kg)   LMP 10/17/1986   SpO2 98%   BMI 21.64 kg/m  BP Readings from Last 3 Encounters:  10/11/23 124/80  09/11/23 126/82  08/14/23 113/70   Wt Readings from Last 3 Encounters:  10/11/23 150 lb 12.8 oz (68.4 kg)  09/11/23 152 lb 12.8 oz (69.3 kg)  08/14/23 152 lb (68.9 kg)      10/11/2023    9:45 AM 09/11/2023   10:15 AM 06/04/2023   12:59 PM  Depression screen PHQ 2/9  Decreased Interest 0 0 0  Down,  Depressed, Hopeless 0 0 0  PHQ - 2 Score 0 0 0  Altered sleeping   0  Tired, decreased energy   0  Change in appetite   0  Feeling bad or failure about yourself    0  Trouble concentrating   0  Moving slowly or fidgety/restless   0  Suicidal thoughts   0  PHQ-9 Score   0  Difficult doing work/chores   Not difficult at all    Physical Exam Vitals reviewed.  Constitutional:      Appearance: She is well-developed.  Eyes:     Conjunctiva/sclera: Conjunctivae normal.  Cardiovascular:     Rate and Rhythm: Normal rate and regular rhythm.     Pulses: Normal pulses.     Heart sounds: Normal heart sounds.  Pulmonary:     Effort: Pulmonary effort is normal.     Breath sounds: Normal breath sounds. No wheezing, rhonchi or rales.  Skin:    General: Skin is warm and dry.  Neurological:     Mental Status: She is alert.  Psychiatric:        Speech: Speech normal.        Behavior: Behavior normal.        Thought Content: Thought content normal.

## 2023-10-15 ENCOUNTER — Telehealth: Payer: Self-pay

## 2023-10-15 DIAGNOSIS — F419 Anxiety disorder, unspecified: Secondary | ICD-10-CM

## 2023-10-15 NOTE — Telephone Encounter (Signed)
 Copied from CRM [id number]. Topic: Clinical - Medication Question >> Oct 15, 2023 11:47 AM Sim Boast F wrote: Reason for CRM: Patient called to follow up with Claris Che on a medication that was discussed. Says provider may have had to speak to patients cardiologist about it first? Requested call back with an update

## 2023-10-16 ENCOUNTER — Telehealth: Payer: Self-pay | Admitting: Family

## 2023-10-16 MED ORDER — BUSPIRONE HCL 5 MG PO TABS
5.0000 mg | ORAL_TABLET | Freq: Two times a day (BID) | ORAL | 2 refills | Status: DC
Start: 2023-10-16 — End: 2024-02-28

## 2023-10-16 NOTE — Telephone Encounter (Signed)
 Noted  Sent in buspar.

## 2023-10-16 NOTE — Telephone Encounter (Signed)
 LVM to call back to inform pt of below, please relay message and  schedule when pt calls back  Call pt   Collaborated with dr hochrein, cardiology   He was agreeable to both buspar or lexapro.    I sent in buspar to use for anxiety ; it is twice daily. I recommend to schedule medicine due to anxiety at this time however she can also use twice daily as needed   Ensure she has VV or in person f/u scheduled in 6-8 wks to follow up

## 2023-10-16 NOTE — Telephone Encounter (Signed)
 Call pt  Collaborated with dr hochrein, cardiology  He was agreeable to both buspar or lexapro.   I sent in buspar to use for anxiety ; it is twice daily. I recommend to schedule medicine due to anxiety at this time however she can also use twice daily as needed  Ensure she has VV or in person f/u scheduled in 6-8 wks to follow up

## 2023-10-16 NOTE — Addendum Note (Signed)
 Addended by: Allegra Grana on: 10/16/2023 07:08 AM   Modules accepted: Orders

## 2023-10-16 NOTE — Telephone Encounter (Signed)
-----   Message from Rollene Rotunda sent at 10/12/2023  7:45 AM EDT ----- She has a paced rhythm so the QTC of 482 is not prolonged.  I think you can use what you think is most effective. Thanks for checking.  Angelina Sheriff ----- Message ----- From: Allegra Grana, FNP Sent: 10/11/2023   1:25 PM EDT To: Rollene Rotunda, MD  Absolutely.  EKG obtained today , QTc 453.  We discussed buspar over lexapro which so could avoid QT prolongation as long okay on your end. ----- Message ----- From: Rollene Rotunda, MD Sent: 09/13/2023   4:52 PM EDT To: Allegra Grana, FNP  Can we get a repeat EKG before starting? ----- Message ----- From: Allegra Grana, FNP Sent: 09/13/2023   2:21 PM EST To: Rollene Rotunda, MD  Dr Antoine Poche.   Hope you are doing well.  I just wanted to run this by you.   I wanted to restart Lexapro 5 mg for patient for anxiety while closely monitoring QTc. Last Qtc 482 06/2023   I plan to repeat EKG in 6 weeks time as long okay with you to start low dose lexapro.  She had stopped the medication at some point last year when she established care with me.  She had been on it for couple years prior.   Jaymes Graff, NP

## 2023-10-17 NOTE — Telephone Encounter (Signed)
 Spoke to pt went over results pt verbalized understanding and we schedule a f/up appt for 11/20/23

## 2023-10-24 NOTE — Progress Notes (Signed)
 Remote pacemaker transmission.

## 2023-10-24 NOTE — Addendum Note (Signed)
 Addended by: Geralyn Flash D on: 10/24/2023 02:11 PM   Modules accepted: Orders

## 2023-11-20 ENCOUNTER — Encounter: Payer: Self-pay | Admitting: Family

## 2023-11-20 ENCOUNTER — Ambulatory Visit: Admitting: Family

## 2023-11-20 VITALS — BP 126/76 | HR 64 | Temp 97.7°F | Ht 70.0 in | Wt 151.6 lb

## 2023-11-20 DIAGNOSIS — E039 Hypothyroidism, unspecified: Secondary | ICD-10-CM

## 2023-11-20 DIAGNOSIS — F419 Anxiety disorder, unspecified: Secondary | ICD-10-CM

## 2023-11-20 MED ORDER — LEVOTHYROXINE SODIUM 88 MCG PO TABS
88.0000 ug | ORAL_TABLET | Freq: Every day | ORAL | 3 refills | Status: AC
Start: 2023-11-20 — End: ?

## 2023-11-20 NOTE — Progress Notes (Unsigned)
   Assessment & Plan:  There are no diagnoses linked to this encounter.   Return precautions given.   Risks, benefits, and alternatives of the medications and treatment plan prescribed today were discussed, and patient expressed understanding.   Education regarding symptom management and diagnosis given to patient on AVS either electronically or printed.  No follow-ups on file.  Bascom Bossier, FNP  Subjective:    Patient ID: Madeline Schaefer, female    DOB: [birth year], 78 y.o.   MRN: [id number]  CC: Madeline Schaefer is a 78 y.o. female who presents today for follow up.   HPI: Feels well today No new complaints   She has used buspar  5mg  prn and feels helpful for anxiety      Follow up after starting buspar  5mg  BID  Previously on celexa Allergies: Bee venom, Codeine, Pneumovax 23 [pneumococcal vac polyvalent], Lisinopril , Hornet venom, Sulfa antibiotics, and Sulfonamide derivatives Current Outpatient Medications on File Prior to Visit  Medication Sig Dispense Refill   aspirin EC 81 MG tablet Take 81 mg by mouth daily.     atorvastatin  (LIPITOR) 80 MG tablet Take 1 tablet (80 mg total) by mouth daily. 90 tablet 4   busPIRone  (BUSPAR ) 5 MG tablet Take 1 tablet (5 mg total) by mouth 2 (two) times daily. As needed for anxiety 60 tablet 2   carvedilol  (COREG ) 3.125 MG tablet Take 1 tablet (3.125 mg total) by mouth 2 (two) times daily with a meal. 180 tablet 2   clobetasol ointment (TEMOVATE) 0.05 % Apply 1 application topically daily as needed (eczema).      clotrimazole -betamethasone  (LOTRISONE ) cream Apply 1 application topically 2 (two) times daily. 30 g 0   EPINEPHrine  0.3 mg/0.3 mL IJ SOAJ injection Inject 0.3 mLs (0.3 mg total) into the muscle once. 2 Device 1   fexofenadine (ALLEGRA) 180 MG tablet      ibuprofen  (ADVIL ) 200 MG tablet Take 2 tablets (400 mg total) by mouth every 8 (eight) hours as needed for moderate pain (with food). 30 tablet 0   levothyroxine   (SYNTHROID ) 88 MCG tablet Take 1 tablet by mouth once daily 90 tablet 3   losartan  (COZAAR ) 25 MG tablet TAKE 1 TABLET BY MOUTH ONCE DAILY TAKE  AN  ADDITIONAL  1/2  (ONE-HALF)  TAB  IF  BP  GREATER  THAN  140/80 90 tablet 0   No current facility-administered medications on file prior to visit.    Review of Systems    Objective:    BP 126/76   Pulse 64   Temp 97.7 F (36.5 C) (Oral)   Ht 5\' 10"  (1.778 m)   Wt 151 lb 9.6 oz (68.8 kg)   LMP 10/17/1986   SpO2 99%   BMI 21.75 kg/m  BP Readings from Last 3 Encounters:  11/20/23 126/76  10/11/23 124/80  09/11/23 126/82   Wt Readings from Last 3 Encounters:  11/20/23 151 lb 9.6 oz (68.8 kg)  10/11/23 150 lb 12.8 oz (68.4 kg)  09/11/23 152 lb 12.8 oz (69.3 kg)    Physical Exam

## 2023-11-21 NOTE — Assessment & Plan Note (Signed)
 Chronic, excellent control.  Continue BuSpar  5 mg twice daily as needed.  Advised she may schedule this medication or take as needed if she found more effective

## 2023-12-17 ENCOUNTER — Telehealth: Payer: Self-pay | Admitting: Family

## 2023-12-17 NOTE — Telephone Encounter (Signed)
 medicare wellness sch 06/10/24 Please sch IN person

## 2023-12-18 ENCOUNTER — Ambulatory Visit (INDEPENDENT_AMBULATORY_CARE_PROVIDER_SITE_OTHER)

## 2023-12-18 DIAGNOSIS — I441 Atrioventricular block, second degree: Secondary | ICD-10-CM | POA: Diagnosis not present

## 2023-12-19 ENCOUNTER — Other Ambulatory Visit: Payer: Self-pay | Admitting: Family

## 2023-12-19 ENCOUNTER — Telehealth: Payer: Self-pay

## 2023-12-19 DIAGNOSIS — I10 Essential (primary) hypertension: Secondary | ICD-10-CM

## 2023-12-19 MED ORDER — LOSARTAN POTASSIUM 25 MG PO TABS: ORAL_TABLET | ORAL | 0 refills | Status: DC

## 2023-12-19 NOTE — Addendum Note (Signed)
 Addended by: Terralyn Matsumura on: 12/19/2023 09:39 AM   Modules accepted: Orders

## 2023-12-19 NOTE — Telephone Encounter (Signed)
 Pt was here in May when do you want in person appt?

## 2023-12-19 NOTE — Telephone Encounter (Signed)
 Copied from CRM [id number]. Topic: Clinical - Prescription Issue >> Dec 19, 2023  8:03 AM Madeline Schaefer wrote: Reason for CRM: Patient is calling because she is going out of town and she is needing the medication Losartan  Postassium sent to the pharmacy for her to pick up before she leaves. A request has already been sent.

## 2023-12-20 ENCOUNTER — Telehealth: Payer: Self-pay

## 2023-12-20 LAB — CUP PACEART REMOTE DEVICE CHECK
Battery Impedance: 3219 Ohm
Battery Remaining Longevity: 19 mo
Battery Voltage: 2.7 V
Brady Statistic AP VP Percent: 62 %
Brady Statistic AP VS Percent: 0 %
Brady Statistic AS VP Percent: 38 %
Brady Statistic AS VS Percent: 0 %
Date Time Interrogation Session: 20250604162828
Implantable Lead Connection Status: 753985
Implantable Lead Connection Status: 753985
Implantable Lead Implant Date: 20040907
Implantable Lead Implant Date: 20040907
Implantable Lead Location: 753859
Implantable Lead Location: 753860
Implantable Lead Model: 5076
Implantable Lead Model: 5076
Implantable Pulse Generator Implant Date: 20130607
Lead Channel Impedance Value: 422 Ohm
Lead Channel Impedance Value: 622 Ohm
Lead Channel Pacing Threshold Amplitude: 0.625 V
Lead Channel Pacing Threshold Amplitude: 0.625 V
Lead Channel Pacing Threshold Pulse Width: 0.4 ms
Lead Channel Pacing Threshold Pulse Width: 0.4 ms
Lead Channel Setting Pacing Amplitude: 2 V
Lead Channel Setting Pacing Amplitude: 2.5 V
Lead Channel Setting Pacing Pulse Width: 0.4 ms
Lead Channel Setting Sensing Sensitivity: 2 mV
Zone Setting Status: 755011
Zone Setting Status: 755011

## 2023-12-20 NOTE — Telephone Encounter (Signed)
 LVM message to call back to inform pt and husband that Medicare Annual Wellness visits are all Telephone calls, not in person.

## 2023-12-20 NOTE — Telephone Encounter (Signed)
 Pacemaker battery estimates <1 mon - 35 mon. Monthly battery checks scheduled in EPIC. Patient called and dates given.

## 2023-12-22 ENCOUNTER — Ambulatory Visit: Payer: Self-pay | Admitting: Cardiology

## 2023-12-24 NOTE — Telephone Encounter (Signed)
 Pt has been notified spoke to [employer] on 12/26/23

## 2024-01-02 ENCOUNTER — Other Ambulatory Visit: Payer: Self-pay | Admitting: Family

## 2024-01-02 DIAGNOSIS — I255 Ischemic cardiomyopathy: Secondary | ICD-10-CM

## 2024-01-21 ENCOUNTER — Ambulatory Visit

## 2024-01-24 LAB — CUP PACEART REMOTE DEVICE CHECK
Battery Impedance: 3428 Ohm
Battery Remaining Longevity: 17 mo
Battery Voltage: 2.71 V
Brady Statistic AP VP Percent: 61 %
Brady Statistic AP VS Percent: 0 %
Brady Statistic AS VP Percent: 39 %
Brady Statistic AS VS Percent: 0 %
Date Time Interrogation Session: 20250709101918
Implantable Lead Connection Status: 753985
Implantable Lead Connection Status: 753985
Implantable Lead Implant Date: 20040907
Implantable Lead Implant Date: 20040907
Implantable Lead Location: 753859
Implantable Lead Location: 753860
Implantable Lead Model: 5076
Implantable Lead Model: 5076
Implantable Pulse Generator Implant Date: 20130607
Lead Channel Impedance Value: 433 Ohm
Lead Channel Impedance Value: 650 Ohm
Lead Channel Pacing Threshold Amplitude: 0.625 V
Lead Channel Pacing Threshold Amplitude: 0.625 V
Lead Channel Pacing Threshold Pulse Width: 0.4 ms
Lead Channel Pacing Threshold Pulse Width: 0.4 ms
Lead Channel Setting Pacing Amplitude: 2 V
Lead Channel Setting Pacing Amplitude: 2.5 V
Lead Channel Setting Pacing Pulse Width: 0.4 ms
Lead Channel Setting Sensing Sensitivity: 2 mV
Zone Setting Status: 755011
Zone Setting Status: 755011

## 2024-02-14 NOTE — Progress Notes (Signed)
 Remote pacemaker transmission.

## 2024-02-14 NOTE — Addendum Note (Signed)
 Addended by: VICCI SELLER A on: 02/14/2024 09:21 AM   Modules accepted: Orders

## 2024-02-21 ENCOUNTER — Ambulatory Visit (INDEPENDENT_AMBULATORY_CARE_PROVIDER_SITE_OTHER)

## 2024-02-21 ENCOUNTER — Other Ambulatory Visit: Payer: Self-pay | Admitting: Family

## 2024-02-21 DIAGNOSIS — I441 Atrioventricular block, second degree: Secondary | ICD-10-CM

## 2024-02-21 DIAGNOSIS — I10 Essential (primary) hypertension: Secondary | ICD-10-CM

## 2024-02-22 LAB — CUP PACEART REMOTE DEVICE CHECK
Battery Impedance: 3492 Ohm
Battery Remaining Longevity: 16 mo
Battery Voltage: 2.7 V
Brady Statistic AP VP Percent: 61 %
Brady Statistic AP VS Percent: 0 %
Brady Statistic AS VP Percent: 39 %
Brady Statistic AS VS Percent: 0 %
Date Time Interrogation Session: 20250808065552
Implantable Lead Connection Status: 753985
Implantable Lead Connection Status: 753985
Implantable Lead Implant Date: 20040907
Implantable Lead Implant Date: 20040907
Implantable Lead Location: 753859
Implantable Lead Location: 753860
Implantable Lead Model: 5076
Implantable Lead Model: 5076
Implantable Pulse Generator Implant Date: 20130607
Lead Channel Impedance Value: 431 Ohm
Lead Channel Impedance Value: 630 Ohm
Lead Channel Pacing Threshold Amplitude: 0.625 V
Lead Channel Pacing Threshold Amplitude: 0.625 V
Lead Channel Pacing Threshold Pulse Width: 0.4 ms
Lead Channel Pacing Threshold Pulse Width: 0.4 ms
Lead Channel Setting Pacing Amplitude: 2 V
Lead Channel Setting Pacing Amplitude: 2.5 V
Lead Channel Setting Pacing Pulse Width: 0.4 ms
Lead Channel Setting Sensing Sensitivity: 2 mV
Zone Setting Status: 755011
Zone Setting Status: 755011

## 2024-02-27 ENCOUNTER — Telehealth: Payer: Self-pay | Admitting: Family

## 2024-02-27 NOTE — Telephone Encounter (Signed)
 Pt husband came into the office to get forms filled out by Arnett for him and his wife folder has been placed in box in the back. Thanks.

## 2024-02-27 NOTE — Telephone Encounter (Signed)
 LVM to call back to office to inform pt and her husband they need ov in order for us  to fill out paperwork that was dropped off

## 2024-02-28 ENCOUNTER — Other Ambulatory Visit: Payer: Self-pay | Admitting: Family

## 2024-02-28 DIAGNOSIS — F419 Anxiety disorder, unspecified: Secondary | ICD-10-CM

## 2024-02-28 NOTE — Telephone Encounter (Signed)
 scheduled

## 2024-02-28 NOTE — Telephone Encounter (Signed)
 Copied from CRM [id number]. Topic: Clinical - Medication Refill >> Feb 28, 2024 12:17 PM Martinique E wrote: Medication: busPIRone  (BUSPAR ) 5 MG tablet  Has the patient contacted their pharmacy? Yes (Agent: If no, request that the patient contact the pharmacy for the refill. If patient does not wish to contact the pharmacy document the reason why and proceed with request.) (Agent: If yes, when and what did the pharmacy advise?)  This is the patient's preferred pharmacy:  [facility] [address] [place] - [address] [place] [address] Phone: [phone] Fax: [phone]  Is this the correct pharmacy for this prescription? Yes If no, delete pharmacy and type the correct one.   Has the prescription been filled recently? No  Is the patient out of the medication? No, 6 pills left.  Has the patient been seen for an appointment in the last year OR does the patient have an upcoming appointment? Yes  Can we respond through MyChart? Yes  Agent: Please be advised that Rx refills may take up to 3 business days. We ask that you follow-up with your pharmacy.

## 2024-02-28 NOTE — Telephone Encounter (Signed)
 FYI Only or Action Required?: Action required by provider: medication refill request.  Patient was last seen in primary care on 11/20/2023 by Dineen Rollene MATSU, FNP.  Called Nurse Triage reporting No chief complaint on file..  Symptoms began today.  Interventions attempted: Nothing.  Symptoms are: stable.  Triage Disposition: No disposition on file.  Patient/caregiver understands and will follow disposition?:

## 2024-02-29 MED ORDER — BUSPIRONE HCL 5 MG PO TABS: 5.0000 mg | ORAL_TABLET | Freq: Two times a day (BID) | ORAL | 0 refills | Status: DC

## 2024-03-12 ENCOUNTER — Ambulatory Visit: Admitting: Internal Medicine

## 2024-03-18 ENCOUNTER — Encounter

## 2024-03-21 ENCOUNTER — Telehealth: Payer: Self-pay

## 2024-03-21 NOTE — Telephone Encounter (Signed)
 Copied from CRM [id number]. Topic: Clinical - Request for Lab/Test Order >> Mar 21, 2024 11:27 AM Berneda FALCON wrote: Reason for CRM: Pt is requesting that PCP call in an order for a COVID booster shot to the following pharmacy, please:  [facility] Address: [address] [place], [place] [address] Phone: [phone] Hours:  Open Closes 10 PM Pharmacy: Open Closes 1:30 PM Reopens 2 PM  More hours

## 2024-03-24 ENCOUNTER — Ambulatory Visit

## 2024-03-24 DIAGNOSIS — I441 Atrioventricular block, second degree: Secondary | ICD-10-CM | POA: Diagnosis not present

## 2024-03-25 LAB — CUP PACEART REMOTE DEVICE CHECK
Battery Impedance: 3497 Ohm
Battery Remaining Longevity: 16 mo
Battery Voltage: 2.7 V
Brady Statistic AP VP Percent: 61 %
Brady Statistic AP VS Percent: 0 %
Brady Statistic AS VP Percent: 39 %
Brady Statistic AS VS Percent: 0 %
Date Time Interrogation Session: 20250908162224
Implantable Lead Connection Status: 753985
Implantable Lead Connection Status: 753985
Implantable Lead Implant Date: 20040907
Implantable Lead Implant Date: 20040907
Implantable Lead Location: 753859
Implantable Lead Location: 753860
Implantable Lead Model: 5076
Implantable Lead Model: 5076
Implantable Pulse Generator Implant Date: 20130607
Lead Channel Impedance Value: 432 Ohm
Lead Channel Impedance Value: 630 Ohm
Lead Channel Pacing Threshold Amplitude: 0.5 V
Lead Channel Pacing Threshold Amplitude: 0.625 V
Lead Channel Pacing Threshold Pulse Width: 0.4 ms
Lead Channel Pacing Threshold Pulse Width: 0.4 ms
Lead Channel Setting Pacing Amplitude: 2 V
Lead Channel Setting Pacing Amplitude: 2.5 V
Lead Channel Setting Pacing Pulse Width: 0.4 ms
Lead Channel Setting Sensing Sensitivity: 2 mV
Zone Setting Status: 755011
Zone Setting Status: 755011

## 2024-03-27 ENCOUNTER — Encounter: Payer: Self-pay | Admitting: Family

## 2024-03-28 ENCOUNTER — Other Ambulatory Visit: Payer: Self-pay

## 2024-03-28 MED ORDER — COVID-19 MRNA VAC-TRIS(PFIZER) 30 MCG/0.3ML IM SUSY: 0.3000 mL | PREFILLED_SYRINGE | Freq: Once | INTRAMUSCULAR | 0 refills | Status: AC

## 2024-03-28 NOTE — Telephone Encounter (Signed)
 Left message to return call to our office.  Need to get more information. Covid vaccine was sent to [facility] as requested.

## 2024-03-28 NOTE — Telephone Encounter (Unsigned)
 Copied from CRM [id number]. Topic: Clinical - Medical Advice >> Mar 28, 2024  9:46 AM Harlene ORN wrote: Reason for CRM: [facility] has to give prescriptions for people over 60 to get their Covid shot. They only time they can get their shot is on Tuesday and they have not yet received their prescription yet. Please advice.

## 2024-03-29 ENCOUNTER — Other Ambulatory Visit: Payer: Self-pay | Admitting: Family

## 2024-03-29 DIAGNOSIS — I255 Ischemic cardiomyopathy: Secondary | ICD-10-CM

## 2024-03-30 ENCOUNTER — Ambulatory Visit: Payer: Self-pay | Admitting: Cardiology

## 2024-03-31 ENCOUNTER — Ambulatory Visit: Admitting: Orthopedic Surgery

## 2024-04-01 ENCOUNTER — Encounter: Admitting: Nurse Practitioner

## 2024-04-01 NOTE — Telephone Encounter (Signed)
 Spoke to pt she has already gotten  Covid vaccine

## 2024-04-03 NOTE — Progress Notes (Signed)
 Remote PPM Transmission

## 2024-04-07 ENCOUNTER — Telehealth: Payer: Self-pay

## 2024-04-07 NOTE — Telephone Encounter (Signed)
 Patient has questions about remote monitor.   Phone number to call [phone].

## 2024-04-09 NOTE — Telephone Encounter (Signed)
 I called and spoke with the patient. She was calling to confirm that her new phone was able to send a transmission in.  Advised the patient that we did receive her transmission from today. She is currently on monthly battery checks (Adapta). The patient is aware that we will keep her monthly cadence as scheduled and her next send is set for 04/24/24. She is aware that if for any reason her transmission is not received at the scheduled time, she will receive a call from the clinic.  The patient voices understanding of the above and is agreeable.

## 2024-04-09 NOTE — Telephone Encounter (Signed)
 Pt is calling back to get answers regarding remote monitor

## 2024-04-24 ENCOUNTER — Encounter

## 2024-04-24 ENCOUNTER — Ambulatory Visit

## 2024-04-25 LAB — CUP PACEART REMOTE DEVICE CHECK
Battery Impedance: 3566 Ohm
Battery Remaining Longevity: 16 mo
Battery Voltage: 2.69 V
Brady Statistic AP VP Percent: 61 %
Brady Statistic AP VS Percent: 0 %
Brady Statistic AS VP Percent: 39 %
Brady Statistic AS VS Percent: 0 %
Date Time Interrogation Session: 20251010082548
Implantable Lead Connection Status: 753985
Implantable Lead Connection Status: 753985
Implantable Lead Implant Date: 20040907
Implantable Lead Implant Date: 20040907
Implantable Lead Location: 753859
Implantable Lead Location: 753860
Implantable Lead Model: 5076
Implantable Lead Model: 5076
Implantable Pulse Generator Implant Date: 20130607
Lead Channel Impedance Value: 431 Ohm
Lead Channel Impedance Value: 611 Ohm
Lead Channel Pacing Threshold Amplitude: 0.625 V
Lead Channel Pacing Threshold Amplitude: 0.625 V
Lead Channel Pacing Threshold Pulse Width: 0.4 ms
Lead Channel Pacing Threshold Pulse Width: 0.4 ms
Lead Channel Setting Pacing Amplitude: 2 V
Lead Channel Setting Pacing Amplitude: 2.5 V
Lead Channel Setting Pacing Pulse Width: 0.4 ms
Lead Channel Setting Sensing Sensitivity: 2 mV
Zone Setting Status: 755011
Zone Setting Status: 755011

## 2024-04-28 DIAGNOSIS — L821 Other seborrheic keratosis: Secondary | ICD-10-CM | POA: Diagnosis not present

## 2024-04-28 DIAGNOSIS — L400 Psoriasis vulgaris: Secondary | ICD-10-CM | POA: Diagnosis not present

## 2024-04-28 DIAGNOSIS — C44619 Basal cell carcinoma of skin of left upper limb, including shoulder: Secondary | ICD-10-CM | POA: Diagnosis not present

## 2024-04-28 DIAGNOSIS — D225 Melanocytic nevi of trunk: Secondary | ICD-10-CM | POA: Diagnosis not present

## 2024-04-28 DIAGNOSIS — D2261 Melanocytic nevi of right upper limb, including shoulder: Secondary | ICD-10-CM | POA: Diagnosis not present

## 2024-04-28 DIAGNOSIS — D2262 Melanocytic nevi of left upper limb, including shoulder: Secondary | ICD-10-CM | POA: Diagnosis not present

## 2024-04-28 DIAGNOSIS — D485 Neoplasm of uncertain behavior of skin: Secondary | ICD-10-CM | POA: Diagnosis not present

## 2024-05-05 ENCOUNTER — Other Ambulatory Visit

## 2024-05-06 ENCOUNTER — Ambulatory Visit
Admission: RE | Admit: 2024-05-06 | Discharge: 2024-05-06 | Disposition: A | Discharge status: Home / Self Care | Source: Ambulatory Visit | Attending: Family | Admitting: Family

## 2024-05-06 ENCOUNTER — Encounter: Payer: Self-pay | Admitting: Family

## 2024-05-06 DIAGNOSIS — Z1231 Encounter for screening mammogram for malignant neoplasm of breast: Secondary | ICD-10-CM | POA: Insufficient documentation

## 2024-05-06 DIAGNOSIS — F419 Anxiety disorder, unspecified: Secondary | ICD-10-CM

## 2024-05-06 MED ORDER — BUSPIRONE HCL 5 MG PO TABS: 5.0000 mg | ORAL_TABLET | Freq: Two times a day (BID) | ORAL | 0 refills | Status: DC

## 2024-05-14 NOTE — Addendum Note (Signed)
 Addended by: VICCI SELLER A on: 05/14/2024 09:47 AM   Modules accepted: Orders, Level of Service

## 2024-05-14 NOTE — Progress Notes (Signed)
 Remote pacemaker transmission.

## 2024-05-19 ENCOUNTER — Encounter: Payer: Self-pay | Admitting: Radiology

## 2024-05-19 ENCOUNTER — Other Ambulatory Visit: Payer: Self-pay | Admitting: *Deleted

## 2024-05-19 ENCOUNTER — Inpatient Hospital Stay
Admission: RE | Admit: 2024-05-19 | Discharge: 2024-05-19 | Disposition: A | Discharge status: Home / Self Care | Payer: Self-pay | Source: Ambulatory Visit | Attending: Family | Admitting: Family

## 2024-05-19 DIAGNOSIS — Z1231 Encounter for screening mammogram for malignant neoplasm of breast: Secondary | ICD-10-CM

## 2024-05-23 ENCOUNTER — Telehealth: Payer: Self-pay

## 2024-05-23 NOTE — Telephone Encounter (Signed)
 Copied from CRM #[id number]. Topic: Clinical - Request for Lab/Test Order >> May 23, 2024 10:08 AM Hadassah PARAS wrote: Reason for CRM: Pt is calling to set up routine lab work. Pt also needs clearance for hiking and other physical activities. Stated she will drop in office to drop off paper work.

## 2024-05-25 ENCOUNTER — Ambulatory Visit

## 2024-05-26 ENCOUNTER — Encounter

## 2024-05-29 ENCOUNTER — Telehealth: Payer: Self-pay

## 2024-05-29 ENCOUNTER — Other Ambulatory Visit: Payer: Self-pay

## 2024-05-29 ENCOUNTER — Encounter: Payer: Self-pay | Admitting: Pharmacist

## 2024-05-29 DIAGNOSIS — Z0000 Encounter for general adult medical examination without abnormal findings: Secondary | ICD-10-CM

## 2024-05-29 LAB — CUP PACEART REMOTE DEVICE CHECK
Battery Impedance: 3818 Ohm
Battery Remaining Longevity: 14 mo
Battery Voltage: 2.71 V
Brady Statistic AP VP Percent: 61 %
Brady Statistic AP VS Percent: 0 %
Brady Statistic AS VP Percent: 39 %
Brady Statistic AS VS Percent: 0 %
Date Time Interrogation Session: 20251113110529
Implantable Lead Connection Status: 753985
Implantable Lead Connection Status: 753985
Implantable Lead Implant Date: 20040907
Implantable Lead Implant Date: 20040907
Implantable Lead Location: 753859
Implantable Lead Location: 753860
Implantable Lead Model: 5076
Implantable Lead Model: 5076
Implantable Pulse Generator Implant Date: 20130607
Lead Channel Impedance Value: 431 Ohm
Lead Channel Impedance Value: 589 Ohm
Lead Channel Pacing Threshold Amplitude: 0.5 V
Lead Channel Pacing Threshold Amplitude: 0.625 V
Lead Channel Pacing Threshold Pulse Width: 0.4 ms
Lead Channel Pacing Threshold Pulse Width: 0.4 ms
Lead Channel Setting Pacing Amplitude: 2 V
Lead Channel Setting Pacing Amplitude: 2.5 V
Lead Channel Setting Pacing Pulse Width: 0.4 ms
Lead Channel Setting Sensing Sensitivity: 2 mV
Zone Setting Status: 755011
Zone Setting Status: 755011

## 2024-05-29 NOTE — Telephone Encounter (Signed)
 Labs have been ordered pt is scheduled for ov on 06/23/24 pt will get labs done a few days before and bring form with her to be completed

## 2024-05-29 NOTE — Progress Notes (Signed)
 Pharmacy Quality Measure Review  This patient is appearing on a report for being at risk of failing the adherence measure for hypertension (ACEi/ARB) medications this calendar year.   Medication: losartan  25 mg Last fill date: 8/23 for 80 day supply 0 refills remaining  Will collaborate with provider to facilitate refill needs. Messaged PCP pool for refill.   Patient is scheduled with PCP, next visit Dec 2025.   Future Appointments  Date Time Provider Department Center  06/10/2024  9:30 AM [facility] ANNUAL WELLNESS VISIT [facility] [address]  06/23/2024  9:00 AM Dineen Rollene MATSU, FNP [facility] [address]

## 2024-05-29 NOTE — Progress Notes (Signed)
LVM to call back to discuss message below.

## 2024-05-29 NOTE — Telephone Encounter (Signed)
 Copied from CRM #[id number]. Topic: General - Other >> May 29, 2024 11:02 AM Delon DASEN wrote: Reason for CRM: patient returning call from office- [phone]

## 2024-05-29 NOTE — Progress Notes (Signed)
 Spoke to pt she states that she is taking her Losartan  she may have missed a couple days but normally she takes it everyday

## 2024-05-29 NOTE — Telephone Encounter (Signed)
 noted

## 2024-05-29 NOTE — Telephone Encounter (Signed)
 Spoke to pt see previous telephone call regarding pt medication

## 2024-05-30 ENCOUNTER — Ambulatory Visit: Payer: Self-pay | Admitting: Cardiology

## 2024-06-04 ENCOUNTER — Other Ambulatory Visit: Payer: Self-pay | Admitting: Family

## 2024-06-04 DIAGNOSIS — I10 Essential (primary) hypertension: Secondary | ICD-10-CM

## 2024-06-17 ENCOUNTER — Encounter

## 2024-06-17 NOTE — Progress Notes (Signed)
 Pharmacy Quality Measure Review  This patient is appearing on a report for being at risk of failing the adherence measure for hypertension (ACEi/ARB) medications this calendar year.   Medication: losartan  25 mg Last fill date: 06/05/24 for 90 day supply  Insurance report was not up to date. No action needed at this time.   Woodie Jock, PharmD PGY1 Pharmacy Resident  06/17/2024

## 2024-06-20 ENCOUNTER — Other Ambulatory Visit

## 2024-06-20 DIAGNOSIS — Z131 Encounter for screening for diabetes mellitus: Secondary | ICD-10-CM | POA: Diagnosis not present

## 2024-06-20 DIAGNOSIS — Z0000 Encounter for general adult medical examination without abnormal findings: Secondary | ICD-10-CM

## 2024-06-20 DIAGNOSIS — Z13220 Encounter for screening for lipoid disorders: Secondary | ICD-10-CM | POA: Diagnosis not present

## 2024-06-20 LAB — CBC WITH DIFFERENTIAL/PLATELET
Basophils Absolute: 0.1 K/uL (ref 0.0–0.1)
Basophils Relative: 1 % (ref 0.0–3.0)
Eosinophils Absolute: 0.2 K/uL (ref 0.0–0.7)
Eosinophils Relative: 3.1 % (ref 0.0–5.0)
HCT: 39.3 % (ref 36.0–46.0)
Hemoglobin: 13.1 g/dL (ref 12.0–15.0)
Lymphocytes Relative: 20.8 % (ref 12.0–46.0)
Lymphs Abs: 1.2 K/uL (ref 0.7–4.0)
MCHC: 33.4 g/dL (ref 30.0–36.0)
MCV: 87.9 fl (ref 78.0–100.0)
Monocytes Absolute: 0.3 K/uL (ref 0.1–1.0)
Monocytes Relative: 6.1 % (ref 3.0–12.0)
Neutro Abs: 3.9 K/uL (ref 1.4–7.7)
Neutrophils Relative %: 69 % (ref 43.0–77.0)
Platelets: 233 K/uL (ref 150.0–400.0)
RBC: 4.47 Mil/uL (ref 3.87–5.11)
RDW: 15.3 % (ref 11.5–15.5)
WBC: 5.7 K/uL (ref 4.0–10.5)

## 2024-06-20 LAB — COMPREHENSIVE METABOLIC PANEL WITH GFR
ALT: 17 U/L (ref 0–35)
AST: 16 U/L (ref 0–37)
Albumin: 4 g/dL (ref 3.5–5.2)
Alkaline Phosphatase: 59 U/L (ref 39–117)
BUN: 21 mg/dL (ref 6–23)
CO2: 30 meq/L (ref 19–32)
Calcium: 9 mg/dL (ref 8.4–10.5)
Chloride: 102 meq/L (ref 96–112)
Creatinine, Ser: 0.84 mg/dL (ref 0.40–1.20)
GFR: 66.79 mL/min (ref >60.00)
Glucose, Bld: 110 mg/dL — ABNORMAL HIGH (ref 70–99)
Potassium: 4.1 meq/L (ref 3.5–5.1)
Sodium: 139 meq/L (ref 135–145)
Total Bilirubin: 0.8 mg/dL (ref 0.2–1.2)
Total Protein: 6.5 g/dL (ref 6.0–8.3)

## 2024-06-20 LAB — LIPID PANEL
Cholesterol: 131 mg/dL (ref 0–200)
HDL: 49.3 mg/dL (ref >39.00)
LDL Cholesterol: 65 mg/dL (ref 0–99)
NonHDL: 81.48
Total CHOL/HDL Ratio: 3
Triglycerides: 84 mg/dL (ref 0.0–149.0)
VLDL: 16.8 mg/dL (ref 0.0–40.0)

## 2024-06-20 LAB — TSH: TSH: 4.93 u[IU]/mL (ref 0.35–5.50)

## 2024-06-20 LAB — VITAMIN D 25 HYDROXY (VIT D DEFICIENCY, FRACTURES): VITD: 14.22 ng/mL — ABNORMAL LOW (ref 30.00–100.00)

## 2024-06-20 LAB — HEMOGLOBIN A1C: Hgb A1c MFr Bld: 6.1 % (ref 4.6–6.5)

## 2024-06-23 ENCOUNTER — Ambulatory Visit: Admitting: Family

## 2024-06-23 ENCOUNTER — Encounter: Payer: Self-pay | Admitting: Family

## 2024-06-23 ENCOUNTER — Ambulatory Visit: Payer: Self-pay | Admitting: Family

## 2024-06-23 VITALS — BP 126/70 | HR 71 | Temp 98.4°F | Ht 69.0 in | Wt 145.8 lb

## 2024-06-23 DIAGNOSIS — I10 Essential (primary) hypertension: Secondary | ICD-10-CM | POA: Diagnosis not present

## 2024-06-23 DIAGNOSIS — R7303 Prediabetes: Secondary | ICD-10-CM | POA: Diagnosis not present

## 2024-06-23 DIAGNOSIS — E785 Hyperlipidemia, unspecified: Secondary | ICD-10-CM | POA: Diagnosis not present

## 2024-06-23 NOTE — Patient Instructions (Addendum)
 I do not see any scheduled follow-up with cardiology,Dr Hochrein.  Please call his office to schedule  For post menopausal women, guidelines recommend a diet with 1200 mg of Calcium  per day. If you are eating calcium  rich foods, you do not need a calcium  supplement. The body better absorbs the calcium  that you eat over supplementation. If you do supplement, I recommend not supplementing the full 1200 mg/ day as this can lead to increased risk of cardiovascular disease. I recommend Calcium  Citrate over the counter, and you may take a total of 600 to 800 mg per day in divided doses with meals for best absorption.   For bone health, you need adequate vitamin D , and I recommend you supplement as it is harder to do so with diet alone. I recommend cholecalciferol 800 units daily.  Also, please ensure you are following a diet high in calcium  -- research shows better outcomes with dietary sources including kale, yogurt, broccolii, cheese, okra, almonds- to name a few.     Also remember that exercise is a great medicine for maintain and preserve bone health. Advise moderate exercise for 30 minutes , 3 times per week.    Your lab work reflects that you are in the pre-diabetes range, if a1c is between 5.7 to 6.4%. A normal fasting blood glucose is less than 100.   It is very important to maintain an exercise program and limit processed, sugary foods  Referral to nutritionist  This is  Dr. Lula  example of a  Low GI  Diet:  It will allow you to lose 4 to 8  lbs  per month if you follow it carefully.  Your goal with exercise is a minimum of 30 minutes of aerobic exercise 5 days per week (Walking does not count once it becomes easy!)    All of the foods can be found at grocery stores and in bulk at [employer].  The Atkins protein bars and shakes are available in more varieties at Target, [employer] and [employer] Foods.     7 AM Breakfast:  Choose from the following:  Low carbohydrate Protein  Shakes (I  recommend the  Premier Protein chocolate shakes,  EAS AdvantEdge Carb Control shakes  Or the Atkins shakes all are under 3 net carbs)     a scrambled egg/bacon/cheese burrito made with Mission's carb balance whole wheat tortilla  (about 10 net carbs )  Medical Laboratory Scientific Officer (basically a quiche without the pastry crust) that is eaten cold and very convenient way to get your eggs.  8 carbs)  If you make your own protein shakes, avoid bananas and pineapple,  And use low carb greek yogurt or original /unsweetened almond or soy milk    Avoid cereal and bananas, oatmeal and cream of wheat and grits. They are loaded with carbohydrates!   10 AM: high protein snack:  Protein bar by Atkins (the snack size, under 200 cal, usually < 6 net carbs).    A stick of cheese:  Around 1 carb,  100 cal     Dannon Light n Fit Greek Yogurt  (80 cal, 8 carbs)  Other so called protein bars and Greek yogurts tend to be loaded with carbohydrates.  Remember, in food advertising, the word energy is synonymous for  carbohydrate.  Lunch:   A Sandwich using the bread choices listed, Can use any  Eggs,  lunchmeat, grilled meat or canned tuna), avocado, regular mayo/mustard  and cheese.  A Salad  using blue cheese, ranch,  Goddess or vinagrette,  Avoid taco shells, croutons or confetti and no candied nuts but regular nuts OK.   No pretzels, nabs  or chips.  Pickles and miniature sweet peppers are a good low carb alternative that provide a crunch  The bread is the only source of carbohydrate in a sandwich and  can be decreased by trying some of the attached alternatives to traditional loaf bread   Avoid Low fat dressings, as well as Oakley and [employer] dressings They are loaded with sugar!   3 PM/ Mid day  Snack:  Consider  1 ounce of  almonds, walnuts, pistachios, pecans, peanuts,  Macadamia nuts or a nut medley.  Avoid granola and granola bars   Mixed nuts are ok in moderation  as long as there are no raisins,  cranberries or dried fruit.   KIND bars are OK if you get the low glycemic index variety   Try the prosciutto/mozzarella cheese sticks by [employer]  In deli /backery section   High protein      6 PM  Dinner:     Meat/fowl/fish with a green salad, and either broccoli, cauliflower, green beans, spinach, brussel sprouts or  Lima beans. DO NOT BREAD THE PROTEIN!!      There is a low carb pasta by Dreamfield's that is acceptable and tastes great: only 5 digestible carbs/serving.( All grocery stores but BJs carry it ) Several ready made meals are available low carb:   Try Michel Angelo's chicken piccata or chicken or eggplant parm over low carb pasta.(Lowes and BJs)   [place] Carnitas (pulled pork, no sauce,  0 carbs) or his beef pot roast to make a dinner burrito (at [employer])  Pesto over low carb pasta (bj's sells a good quality pesto in the center refrigerated section of the deli   Try satueeing  Glenna Blonder with mushroooms as a good side   [employer] makes a mashed cauliflower that tastes like mashed potatoes  Whole wheat pasta is still full of digestible carbs and  Not as low in glycemic index as Dreamfield's.   Brown rice is still rice,  So skip the rice and noodles if you eat Chinese or Thai (or at least limit to 1/2 cup)  9 PM snack :   Breyer's low carb fudgsicle or  ice cream bar (Carb Smart line), or  Weight Watcher's ice cream bar , or another no sugar added ice cream;  a serving of fresh berries/cherries with whipped cream   Cheese or DANNON'S LlGHT N FIT GREEK YOGURT  8 ounces of [place] unsweetened almond/cococunut milk    Treat yourself to a parfait made with whipped cream blueberiies, walnuts and vanilla greek yogurt  Avoid bananas, pineapple, grapes  and watermelon on a regular basis because they are high in sugar.  THINK OF THEM AS DESSERT  Remember that snack Substitutions should be less than 10 NET carbs per serving and meals <  20 carbs. Remember to subtract fiber grams to get the net carbs.

## 2024-06-23 NOTE — Progress Notes (Unsigned)
   Assessment & Plan:  There are no diagnoses linked to this encounter.   Return precautions given.   Risks, benefits, and alternatives of the medications and treatment plan prescribed today were discussed, and patient expressed understanding.   Education regarding symptom management and diagnosis given to patient on AVS either electronically or printed.  No follow-ups on file.  Rollene Northern, FNP  Subjective:    Patient ID: Sharlet LELON Silversmith, female    DOB: 06-Mar-1946, 78 y.o.   MRN: [id number]  CC: AAMILAH AUGENSTEIN is a 78 y.o. female who presents today for follow up.   HPI: Accompanied by husband  Form from [employer]  She would like to participate in light walking on natural ground.   She denies CP, sob, leg swelling  No recent falls  She endorses dietary indiscretion with eating candy,   H/o prediabetes Trial of buspar  5mg  BID Follow-up cardiology Dr. Tish in 06/28/2023 for CAD with negative perfusion study in 2023 She has a pacemaker Echocardiogram 07/31/2023 EF low normal, mild to moderate mitral regurgitation  Allergies: Bee venom, Codeine, Pneumovax 23 [pneumococcal vac polyvalent], Lisinopril , Hornet venom, Sulfa antibiotics, and Sulfonamide derivatives Current Outpatient Medications on File Prior to Visit  Medication Sig Dispense Refill   aspirin EC 81 MG tablet Take 81 mg by mouth daily.     atorvastatin  (LIPITOR) 80 MG tablet Take 1 tablet by mouth once daily 90 tablet 0   busPIRone  (BUSPAR ) 5 MG tablet Take 1 tablet (5 mg total) by mouth 2 (two) times daily. As needed for anxiety 60 tablet 0   carvedilol  (COREG ) 3.125 MG tablet TAKE 1 TABLET BY MOUTH TWICE DAILY WITH A MEAL 180 tablet 0   clobetasol ointment (TEMOVATE) 0.05 % Apply 1 application topically daily as needed (eczema).      clotrimazole -betamethasone  (LOTRISONE ) cream Apply 1 application topically 2 (two) times daily. 30 g 0   EPINEPHrine  0.3 mg/0.3 mL IJ SOAJ injection Inject  0.3 mLs (0.3 mg total) into the muscle once. 2 Device 1   fexofenadine (ALLEGRA) 180 MG tablet      ibuprofen  (ADVIL ) 200 MG tablet Take 2 tablets (400 mg total) by mouth every 8 (eight) hours as needed for moderate pain (with food). 30 tablet 0   levothyroxine  (SYNTHROID ) 88 MCG tablet Take 1 tablet (88 mcg total) by mouth daily. 90 tablet 3   losartan  (COZAAR ) 25 MG tablet TAKE 1 TABLET BY MOUTH ONCE DAILY (TAKE AN ADDITIONAL ONE-HALF TABLET IF BP IS GREATER THAN 140/80) 90 tablet 0   No current facility-administered medications on file prior to visit.    Review of Systems    Objective:    BP 126/70   Pulse 71   Temp 98.4 F (36.9 C) (Oral)   Ht 5' 9 (1.753 m)   Wt 145 lb 12.8 oz (66.1 kg)   LMP 10/17/1986   SpO2 99%   BMI 21.53 kg/m  BP Readings from Last 3 Encounters:  06/23/24 126/70  11/20/23 126/76  10/11/23 124/80   Wt Readings from Last 3 Encounters:  06/23/24 145 lb 12.8 oz (66.1 kg)  11/20/23 151 lb 9.6 oz (68.8 kg)  10/11/23 150 lb 12.8 oz (68.4 kg)    Physical Exam

## 2024-06-25 ENCOUNTER — Ambulatory Visit

## 2024-06-26 ENCOUNTER — Encounter

## 2024-06-26 LAB — CUP PACEART REMOTE DEVICE CHECK
Battery Impedance: 3830 Ohm
Battery Remaining Longevity: 14 mo
Battery Voltage: 2.69 V
Brady Statistic AP VP Percent: 62 %
Brady Statistic AP VS Percent: 0 %
Brady Statistic AS VP Percent: 38 %
Brady Statistic AS VS Percent: 0 %
Date Time Interrogation Session: 20251210210054
Implantable Lead Connection Status: 753985
Implantable Lead Connection Status: 753985
Implantable Lead Implant Date: 20040907
Implantable Lead Implant Date: 20040907
Implantable Lead Location: 753859
Implantable Lead Location: 753860
Implantable Lead Model: 5076
Implantable Lead Model: 5076
Implantable Pulse Generator Implant Date: 20130607
Lead Channel Impedance Value: 445 Ohm
Lead Channel Impedance Value: 621 Ohm
Lead Channel Pacing Threshold Amplitude: 0.625 V
Lead Channel Pacing Threshold Amplitude: 0.625 V
Lead Channel Pacing Threshold Pulse Width: 0.4 ms
Lead Channel Pacing Threshold Pulse Width: 0.4 ms
Lead Channel Setting Pacing Amplitude: 2 V
Lead Channel Setting Pacing Amplitude: 2.5 V
Lead Channel Setting Pacing Pulse Width: 0.4 ms
Lead Channel Setting Sensing Sensitivity: 2 mV
Zone Setting Status: 755011
Zone Setting Status: 755011

## 2024-06-27 ENCOUNTER — Ambulatory Visit: Payer: Self-pay

## 2024-06-27 NOTE — Assessment & Plan Note (Addendum)
 Chronic, stable.  continue Coreg  3.125 mg twice daily, losartan  25 mg daily.  Advised to take additiona l12.5 mg of losartan  for blood pressure greater than 140 /80  she will continue to follow with Dr Lavona. Of note, completed form for [employer] for patient to participate in guided hiking program.

## 2024-06-27 NOTE — Assessment & Plan Note (Signed)
 Chronic, excellent control.  Continue Lipitor 80 mg daily.

## 2024-06-27 NOTE — Assessment & Plan Note (Signed)
 Discussed dietary discretion.  Jointly agreed referral to nutrition would be appropriate

## 2024-06-27 NOTE — Telephone Encounter (Signed)
 FYI Only or Action Required?: Action required by provider: update on patient condition.  Patient was last seen in primary care on 06/23/2024 by Dineen Rollene MATSU, FNP.  Called Nurse Triage reporting Diarrhea.  Symptoms began a week ago.  Interventions attempted: OTC medications: imodium and Rest, hydration, or home remedies.  Symptoms are: unchanged.  Triage Disposition: See Physician Within 24 Hours  Patient/caregiver understands and will follow disposition?: Yes  Copied from CRM #[id number]. Topic: Clinical - Red Word Triage >> Jun 27, 2024  9:44 AM Vena HERO wrote: Red Word that prompted transfer to Nurse Triage: loose and runny stool, similar to diarrhea Reason for Disposition  [1] MODERATE diarrhea (e.g., 4-6 times / day more than normal) AND [2] age > 70 years  Answer Assessment - Initial Assessment Questions Pt called in reporting diarrhea x1 week with 3-4 episode a day. States that it almost feels like she isn't going to make it to the bathroom; reported she feels like she has gas but if she doesn't go to the bathroom, it all starts coming out. Pt denies any blood, GI pain or cramping, no abx use or fever. Pt states stool is soft and minimally formed, light brown in color. Denies any foul smell or odor. Discussed home care and pt reports taking imodium that does seem to help as BMs decrease in the afternoon. States that the morning is when it is the worst so she has been trying to drink water as soon as she wakes up; reports mild dizziness today. Offered appt at clinic 12/15 as soonest available but pt elects to call GI doctor to see if they can see her today.    1. DIARRHEA SEVERITY: How bad is the diarrhea? How many more stools have you had in the past 24 hours than normal?      3-4x times yesterday, worse in the am x 1 week   2. ONSET: When did the diarrhea begin?      1 week   3. STOOL DESCRIPTION:  How loose or watery is the diarrhea? What is the stool color? Is  there any blood or mucous in the stool?     Semi formed, very soft. Denies blood, states it is a light brown   4. VOMITING: Are you also vomiting? If Yes, ask: How many times in the past 24 hours?      No   5. ABDOMEN PAIN: Are you having any abdomen pain? If Yes, ask: What does it feel like? (e.g., crampy, dull, intermittent, constant)      None   6. ABDOMEN PAIN SEVERITY: If present, ask: How bad is the pain?  (e.g., Scale 1-10; mild, moderate, or severe)     None  7. ORAL INTAKE: If vomiting, Have you been able to drink liquids? How much liquids have you had in the past 24 hours?     None   8. HYDRATION: Any signs of dehydration? (e.g., dry mouth [not just dry lips], too weak to stand, dizziness, new weight loss) When did you last urinate?     Minimal dizziness today in am; states she has been increasing her fluids since she has been going to the bathroom more   10. ANTIBIOTIC USE: Are you taking antibiotics now or have you taken antibiotics in the past 2 months?       None   11. OTHER SYMPTOMS: Do you have any other symptoms? (e.g., fever, blood in stool)       None  Protocols  used: Diarrhea-A-AH

## 2024-06-30 ENCOUNTER — Telehealth: Payer: Self-pay

## 2024-06-30 NOTE — Telephone Encounter (Signed)
 LVM  to call back to office to check on pt regarding message sent previously

## 2024-06-30 NOTE — Telephone Encounter (Signed)
 Copied from CRM [id number]. Topic: General - Other >> Jun 30, 2024  4:30 PM Delon DASEN wrote: Reason for CRM: patient returning call - doing alright, symptoms have resolved, will call if any other symptoms start up or call GI doctor

## 2024-07-01 NOTE — Telephone Encounter (Signed)
 noted

## 2024-07-01 NOTE — Telephone Encounter (Signed)
 Pt called back symptoms have resolved but will call back if they return

## 2024-07-01 NOTE — Telephone Encounter (Signed)
 Noted

## 2024-07-02 NOTE — Progress Notes (Signed)
 Remote PPM Transmission

## 2024-07-03 ENCOUNTER — Ambulatory Visit: Payer: Self-pay | Admitting: Cardiology

## 2024-07-04 ENCOUNTER — Other Ambulatory Visit

## 2024-07-04 ENCOUNTER — Telehealth: Payer: Self-pay | Admitting: Physician Assistant

## 2024-07-04 ENCOUNTER — Encounter: Payer: Self-pay | Admitting: Physician Assistant

## 2024-07-04 ENCOUNTER — Ambulatory Visit: Admitting: Physician Assistant

## 2024-07-04 VITALS — BP 122/70 | HR 71 | Ht 69.0 in | Wt 142.0 lb

## 2024-07-04 DIAGNOSIS — R634 Abnormal weight loss: Secondary | ICD-10-CM | POA: Diagnosis not present

## 2024-07-04 DIAGNOSIS — I351 Nonrheumatic aortic (valve) insufficiency: Secondary | ICD-10-CM

## 2024-07-04 DIAGNOSIS — I441 Atrioventricular block, second degree: Secondary | ICD-10-CM

## 2024-07-04 DIAGNOSIS — I2510 Atherosclerotic heart disease of native coronary artery without angina pectoris: Secondary | ICD-10-CM | POA: Diagnosis not present

## 2024-07-04 DIAGNOSIS — R197 Diarrhea, unspecified: Secondary | ICD-10-CM | POA: Diagnosis not present

## 2024-07-04 DIAGNOSIS — K219 Gastro-esophageal reflux disease without esophagitis: Secondary | ICD-10-CM | POA: Diagnosis not present

## 2024-07-04 DIAGNOSIS — I25119 Atherosclerotic heart disease of native coronary artery with unspecified angina pectoris: Secondary | ICD-10-CM

## 2024-07-04 DIAGNOSIS — I255 Ischemic cardiomyopathy: Secondary | ICD-10-CM | POA: Diagnosis not present

## 2024-07-04 DIAGNOSIS — Z950 Presence of cardiac pacemaker: Secondary | ICD-10-CM

## 2024-07-04 LAB — COMPREHENSIVE METABOLIC PANEL WITH GFR
ALT: 14 U/L (ref 3–35)
AST: 16 U/L (ref 5–37)
Albumin: 4.2 g/dL (ref 3.5–5.2)
Alkaline Phosphatase: 60 U/L (ref 39–117)
BUN: 19 mg/dL (ref 6–23)
CO2: 31 meq/L (ref 19–32)
Calcium: 9 mg/dL (ref 8.4–10.5)
Chloride: 104 meq/L (ref 96–112)
Creatinine, Ser: 0.84 mg/dL (ref 0.40–1.20)
GFR: 66.77 mL/min
Glucose, Bld: 98 mg/dL (ref 70–99)
Potassium: 3.7 meq/L (ref 3.5–5.1)
Sodium: 141 meq/L (ref 135–145)
Total Bilirubin: 0.6 mg/dL (ref 0.2–1.2)
Total Protein: 7.2 g/dL (ref 6.0–8.3)

## 2024-07-04 LAB — CBC WITH DIFFERENTIAL/PLATELET
Basophils Absolute: 0.1 K/uL (ref 0.0–0.1)
Basophils Relative: 0.7 % (ref 0.0–3.0)
Eosinophils Absolute: 0.2 K/uL (ref 0.0–0.7)
Eosinophils Relative: 1.9 % (ref 0.0–5.0)
HCT: 39.7 % (ref 36.0–46.0)
Hemoglobin: 13 g/dL (ref 12.0–15.0)
Lymphocytes Relative: 14.2 % (ref 12.0–46.0)
Lymphs Abs: 1.2 K/uL (ref 0.7–4.0)
MCHC: 32.7 g/dL (ref 30.0–36.0)
MCV: 88.5 fl (ref 78.0–100.0)
Monocytes Absolute: 1 K/uL (ref 0.1–1.0)
Monocytes Relative: 11.8 % (ref 3.0–12.0)
Neutro Abs: 6.1 K/uL (ref 1.4–7.7)
Neutrophils Relative %: 71.4 % (ref 43.0–77.0)
Platelets: 256 K/uL (ref 150.0–400.0)
RBC: 4.48 Mil/uL (ref 3.87–5.11)
RDW: 14.4 % (ref 11.5–15.5)
WBC: 8.5 K/uL (ref 4.0–10.5)

## 2024-07-04 LAB — SEDIMENTATION RATE: Sed Rate: 4 mm/h (ref 0–30)

## 2024-07-04 MED ORDER — NA SULFATE-K SULFATE-MG SULF 17.5-3.13-1.6 GM/177ML PO SOLN: 1.0000 | Freq: Once | ORAL | 0 refills | Status: AC

## 2024-07-04 NOTE — Progress Notes (Signed)
 "    07/04/2024 ALIANNA WURSTER [id number] [birth year]  Referring provider: Dineen Rollene MATSU, FNP Primary GI doctor: Dr. Avram  ASSESSMENT AND PLAN:  Diarrhea with unintentional weight loss for the past year about 10 lbs over the past year, weakness x 2 weeks Worse in the morning, can have nocturnal symptoms, shaky feeling in the morning as well, decreased eating over past year New supplement lithium orotate x 1-2 months - stop the new supplement, very suspicious this is the culprit - stool samples to rule out infection, Cdiff and stool culture only  -ESR to rule out inflammation  -Add on citracel/benefiber, FODMAP, trial off lactulose and lifestyle changes discussed, possible post infectious IBS -Imodium as needed for now, can take prior to eating or travel -Will schedule for endoscopic evaluation, discussed with patient and agrees with plan. As patient is due and to rule out microscopic colitis -Consider SIBO testing or xifaxin trial pending results - consider CT AB and pelvis, no pain on exam  GERD denies dysphagia 07/11/2022 barium swallow mild esophageal dysmotility moderate GERD no evidence esophageal mass inflammation or stricture Continue to protonix  40 mg once daily before food, stop wine, no NSAIDS RARE GERD, off pantoprazole , takes gaviscon as needed Continue diet  Screen for colon cancer 10/15/2013 colonoscopy with Dr. Avram for screening purposes good prep with Suprep, unremarkable recall 10/2023.  Ischemic cardiomyopathy, CAD, with moderate AR, mobitz 2 s/p pacemaker 07/31/2023 echocardiogram shows ejection fraction 5055% grade 1 diastolic dysfunction normal RVSP mild to moderate MR no aortic stenosis, moderate AR    Patient Care Team: Dineen Rollene MATSU, FNP as PCP - General (Family Medicine) Cindie Ole DASEN, MD as PCP - Electrophysiology (Cardiology) Lynwood Schilling, MD as PCP - Cardiology (Cardiology) Avram Lupita BRAVO, MD as Consulting Physician  (Gastroenterology) Lynwood Schilling, MD as Consulting Physician (Cardiology)  HISTORY OF PRESENT ILLNESS: 78 y.o. female with a past medical history of ischemic cardiomyopathy, CAD, AR, Mobitz type II status post pacemaker, dyslipidemia and others listed below presents for evaluation of Diarrhea.  I remotely saw the patient in 2023 for noncardiac chest pain, barium swallow showed moderate reflux mild to Litty do not mass inflammation or stricture  The patient presents with diarrhea and weight loss.  They have been experiencing diarrhea for at least two weeks, primarily in the morning. The stool is loose, dark, and resembles a 'pile of sand,' sometimes containing undigested food like salad. Episodes occur one to three times in the morning, accompanied by a shaky feeling that improves after eating or drinking. They have used Imodium occasionally, which provides relief, and have tried Pepto Bismol tablets, which they found sticky and unpleasant.  Over the past year, they have experienced unintentional weight loss of about ten pounds. Their typical diet includes coffee and toast in the morning, and a small lunch consisting of half a sandwich and a few potatoes. They do not experience early satiety or significant changes in appetite.  They occasionally experience heartburn, for which they use Gaviscon, but have not taken pantoprazole  recently. They have been taking a supplement called lithium orotate for about a month, which they suspect might be contributing to their gastrointestinal symptoms. They have not been on antibiotics recently and report no recent illnesses or exposure to sick individuals.  No nausea, vomiting, abdominal pain, fever, chills, urinary issues, or stool incontinence. They report feeling better after moving around and consuming liquids. They also mention experiencing gas and bloating, particularly when using the bathroom.  They live in a  retirement community and are active, engaging  in activities like walking and yoga.  Wt Readings from Last 5 Encounters:  07/04/24 142 lb (64.4 kg)  06/23/24 145 lb 12.8 oz (66.1 kg)  11/20/23 151 lb 9.6 oz (68.8 kg)  10/11/23 150 lb 12.8 oz (68.4 kg)  09/11/23 152 lb 12.8 oz (69.3 kg)     She  reports that she has never smoked. She has never used smokeless tobacco. She reports current alcohol use. She reports that she does not use drugs.  Current Medications:   Current Outpatient Medications (Endocrine & Metabolic):    levothyroxine  (SYNTHROID ) 88 MCG tablet, Take 1 tablet (88 mcg total) by mouth daily.  Current Outpatient Medications (Cardiovascular):    atorvastatin  (LIPITOR) 80 MG tablet, Take 1 tablet by mouth once daily   carvedilol  (COREG ) 3.125 MG tablet, TAKE 1 TABLET BY MOUTH TWICE DAILY WITH A MEAL   EPINEPHrine  0.3 mg/0.3 mL IJ SOAJ injection, Inject 0.3 mLs (0.3 mg total) into the muscle once.   losartan  (COZAAR ) 25 MG tablet, TAKE 1 TABLET BY MOUTH ONCE DAILY (TAKE AN ADDITIONAL ONE-HALF TABLET IF BP IS GREATER THAN 140/80)  Current Outpatient Medications (Respiratory):    fexofenadine (ALLEGRA) 180 MG tablet,   Current Outpatient Medications (Analgesics):    aspirin EC 81 MG tablet, Take 81 mg by mouth daily.   ibuprofen  (ADVIL ) 200 MG tablet, Take 2 tablets (400 mg total) by mouth every 8 (eight) hours as needed for moderate pain (with food).  Current Outpatient Medications (Other):    busPIRone  (BUSPAR ) 5 MG tablet, Take 1 tablet (5 mg total) by mouth 2 (two) times daily. As needed for anxiety   clobetasol ointment (TEMOVATE) 0.05 %, Apply 1 application topically daily as needed (eczema).    clotrimazole -betamethasone  (LOTRISONE ) cream, Apply 1 application topically 2 (two) times daily.   LITHIUM OROTATE PO, Take 1 1e11 Vector Genomes by mouth daily.   Na Sulfate-K Sulfate-Mg Sulfate concentrate (SUPREP) 17.5-3.13-1.6 GM/177ML SOLN, Take 1 kit (354 mLs total) by mouth once for 1 dose.   Medical History:   Past Medical History:  Diagnosis Date   Aortic insufficiency    a. 12/2020 Echo: EF 55-60%, no rwma, nl RV fxn, RVSP 31.55mmHg, mild MR, mod TR, mild Ao sclerosis. Mod AI. Mod dil PA.   Aortic valve sclerosis    Blood transfusion without reported diagnosis    BRONCHITIS    CAD (coronary artery disease)    a. 08/2001 PCI: LAD 15m (2.75x13 Zeta BMS); b. 12/2009 Cath: LM nl, LAD <30ISR, RI nl, LCX nl, RCA nl, EF nl; c. 01/2017 MV: small apical inf defect w/o ischemia. EF 59%; d. 11/2021 Lexi PET/CT: EF 56% (61% w/ stress), small fixed apical/inf defect 2/2 PPM (artifact). No ischemia-->low risk.   Cardiac pacemaker in situ    a. 2004 s/p PPM (B[facility]ie); b. 12/2011 Gen Change ([facility]): MD Adapta L ADDRL1 DC PPM (ser # [id number]).   Cataract    DYSPNEA    HLD (hyperlipidemia)    Hypothyroidism    Mobitz type 2 second degree heart block    a. 2004 s/p PPM (Brodie); b. 12/2011 Gen Change ([facility]): MD Adapta L ADDRL1 DC PPM (ser # [id number]).   Osteopenia    Tricuspid regurgitation    Allergies:  Allergies  Allergen Reactions   Bee Venom Anaphylaxis   Codeine Other (See Comments) and Rash    REACTION: Passed  out, facial swelling and hives   Pneumovax 23 [Pneumococcal Vac Polyvalent] Swelling  Local swelling and pain   Lisinopril      COUGH   Hornet Venom Hives    Hives over entire body   Sulfa Antibiotics Hives   Sulfonamide Derivatives Hives     Surgical History:  She  has a past surgical history that includes Pacemaker insertion (2004, 12/22/11); Total abdominal hysterectomy w/ bilateral salpingoophorectomy; Myomectomy; Cardiac catheterization; Cystoscopy with retrograde pyelogram, ureteroscopy and stent placement (08/19/2012); and pacemaker generator change (N/A, 12/22/2011). Family History:  Her family history includes Breast cancer in her mother; Heart attack in her mother; Prostate cancer in her father.  REVIEW OF SYSTEMS  : All other systems reviewed and negative except where noted in the  History of Present Illness.  PHYSICAL EXAM: BP 122/70   Pulse 71   Ht 5' 9 (1.753 m)   Wt 142 lb (64.4 kg)   LMP 10/17/1986   BMI 20.97 kg/m  GENERAL APPEARANCE: Well nourished, in no apparent distress. HEENT: No cervical lymphadenopathy, unremarkable thyroid , sclerae anicteric, conjunctiva pink. RESPIRATORY: Respiratory effort normal, breath sounds equal bilateral without rales, rhonchi, wheezing. CARDIO: Regular rate and rhythm with no murmurs, rubs, or gallops, peripheral pulses intact. ABDOMEN: Soft, non-distended, active bowel sounds in all four quadrants, no tenderness to palpation, no rebound, no mass appreciated, abdomen normal. RECTAL: Declines. MUSCULOSKELETAL: Full range of motion, normal gait, without edema. SKIN: Dry, intact without rashes or lesions. No jaundice. NEURO: Alert, oriented, no focal deficits. PSYCH: Cooperative, normal mood and affect.  RELEVANT LABS AND IMAGING: CBC    Component Value Date/Time   WBC 5.7 06/20/2024 0857   RBC 4.47 06/20/2024 0857   HGB 13.1 06/20/2024 0857   HCT 39.3 06/20/2024 0857   PLT 233.0 06/20/2024 0857   MCV 87.9 06/20/2024 0857   MCH 29.7 08/19/2012 0527   MCHC 33.4 06/20/2024 0857   RDW 15.3 06/20/2024 0857   LYMPHSABS 1.2 06/20/2024 0857   MONOABS 0.3 06/20/2024 0857   EOSABS 0.2 06/20/2024 0857   BASOSABS 0.1 06/20/2024 0857    CMP     Component Value Date/Time   NA 139 06/20/2024 0857   K 4.1 06/20/2024 0857   CL 102 06/20/2024 0857   CO2 30 06/20/2024 0857   GLUCOSE 110 (H) 06/20/2024 0857   BUN 21 06/20/2024 0857   CREATININE 0.84 06/20/2024 0857   CREATININE 0.73 05/27/2021 1535   CALCIUM  9.0 06/20/2024 0857   PROT 6.5 06/20/2024 0857   PROT 6.9 01/12/2017 1544   ALBUMIN 4.0 06/20/2024 0857   ALBUMIN 4.3 01/12/2017 1544   AST 16 06/20/2024 0857   ALT 17 06/20/2024 0857   ALKPHOS 59 06/20/2024 0857   BILITOT 0.8 06/20/2024 0857   BILITOT 0.8 01/12/2017 1544   GFRNONAA 75 (L) 08/19/2012 0527    GFRAA 87 (L) 08/19/2012 0527     Alan JONELLE Coombs, PA-C 3:53 PM   "

## 2024-07-04 NOTE — Telephone Encounter (Signed)
 Returned patient call. Patient reports 1 week of very  early AM, unusual, loose BMs- describes as sand.  Afterwards patient will feel week and shaky as if she has low blood sugar.  Patient has tried Imodum and peptobismol without change in symptoms.  Patient also reports unintentional 20 pound weight loss over the past year. Appointment made for patient today @ 2:10 w/ A. Craig.,

## 2024-07-04 NOTE — Telephone Encounter (Signed)
 PT is calling to speak with a nurse. Stated that her early morning BM are really loose and she is feeling weak, almost hyperglycemic. She would like to discuss her symptoms. Please advise.

## 2024-07-04 NOTE — Patient Instructions (Addendum)
 Your provider has requested that you go to the basement level for lab work before leaving today. Press B on the elevator. The lab is located at the first door on the left as you exit the elevator.  We have sent the following medications to your pharmacy for you to pick up at your convenience: Suprep  You have been scheduled for a colonoscopy. Please follow written instructions given to you at your visit today.   If you use inhalers (even only as needed), please bring them with you on the day of your procedure.  DO NOT TAKE 7 DAYS PRIOR TO TEST- Trulicity (dulaglutide) Ozempic, Wegovy (semaglutide) Mounjaro, Zepbound (tirzepatide) Bydureon Bcise (exanatide extended release)  DO NOT TAKE 1 DAY PRIOR TO YOUR TEST Rybelsus (semaglutide) Adlyxin (lixisenatide) Victoza (liraglutide) Byetta (exanatide) ___________________________________________________________________________    VISIT SUMMARY:  You came in today because you have been experiencing diarrhea and weight loss. We discussed potential causes and planned several steps to address your symptoms and ensure your overall health.  YOUR PLAN:  CHRONIC DIARRHEA WITH UNINTENTIONAL WEIGHT LOSS: You have been experiencing diarrhea for at least two weeks, along with unintentional weight loss over the past year. This may be related to a supplement you have been taking. -Stop taking the lithium orotate supplement. -Start taking a fiber supplement, such as Betafiber. -We will order stool studies to rule out infections, including C. diff and a stool culture. -We will also order lab tests to check for inflammation. -You can use Imodium, up to 16 mg per day, to help manage your diarrhea. Follow the dosing instructions provided. -We provided information on conditions like post-infectious IBS, microscopic colitis, and SIBO.  COLORECTAL CANCER SCREENING: Due to your chronic diarrhea, weight loss, and the need for routine screening, a  colonoscopy is recommended. -Schedule a colonoscopy with Dr. Avram in January or February.  FIBER SUPPLEMENT You can do metamucil or fibercon once or twice a day but if this causes gas/bloating please switch to Benefiber or Citracel.  Fiber is good for constipation/diarrhea/irritable bowel syndrome.  It can also help with weight loss and can help lower your bad cholesterol (LDL).  Please do 1 TBSP in the morning in water, coffee, or tea.  It can take up to a month before you can see a difference with your bowel movements.  It is cheapest from [employer], [employer], [employer].   - Can try loperamide 4-2 mg initially, then 2 mg after each unformed stool for =2 days, with a maximum of 16 mg/day.  - If loperamide is not working, you could try bismuth salicylate (Pepto-Bismol) 30 mL or two tablets every 30 minutes for eight doses. Pepto-Bismol may make your stools black.   Go to the ER if any severe abdominal pain, fever, or weakness   You may have POST INFECTIOUS IBS OR IRRITABLE BOWEL After an infection or diverticulitis flare your intestines can spasm or be a little bit more sensitive. Try these things below:  Can do BRAT diet versus low FODMAP- see below Try trial off milk/lactose products.  Add fiber like benefiber or citracel once a day Can do trial of IBGard for AB pain EVERY DAY- Take 1-2 capsules once a day for maintence or twice a day during a flare Can take dicyclomine  as needed.  if any worsening symptoms like blood in stool, weight loss, please call the office or go to the ER.    FODMAP stands for fermentable oligo-, di-, mono-saccharides and polyols (1). These are the scientific terms  used to classify groups of carbs that are notorious for triggering digestive symptoms like bloating, gas and stomach pain.     What Is Microscopic Colitis? Microscopic colitis is a condition that causes chronic, watery diarrhea. Unlike other types of colitis (like ulcerative colitis or Crohns  disease), the colon (large intestine) appears normal during a colonoscopy. The inflammation can only be seen under a microscope--hence the name.  There are two main types:  Lymphocytic colitis - increased white blood cells (lymphocytes) in the colon lining Collagenous colitis - thickened layer of collagen (a protein) in the colon lining  Common Symptoms  Ongoing watery diarrhea (often multiple times a day) Urgency to have a bowel movement Abdominal pain or cramping Bloating or gas Fatigue Weight loss (less common)  Causes and Risk Factors The exact cause isnt fully known, but possible contributing factors include:  Immune system reactions  Medications, such as: NSAIDs (e.g., ibuprofen ) Proton pump inhibitors (e.g., omeprazole) SSRIs (e.g., sertraline ) Smoking Older age (most common in people over 9) Female sex (more common in women)   How Is It Diagnosed?  Colonoscopy: usually appears normal  Biopsy (tiny tissue sample from the colon) is needed to see inflammation under a microscope  Treatment Options Treatment depends on how severe your symptoms are:  Lifestyle & Diet Changes  Avoid trigger foods (e.g., caffeine, dairy, fatty foods) Quit smoking Reduce alcohol Stay hydrated  Medications  Anti-diarrheal meds (like loperamide/Imodium) Budesonide (a corticosteroid with fewer side effects, often the first choice for moderate-to-severe cases) Bismuth subsalicylate (e.g., Pepto-Bismol) Stopping or switching medications that may be triggering the condition   Whats the Outlook?  Many people improve with treatment. The condition may come and go. Its not associated with colon cancer. Regular follow-up helps keep symptoms under control.  Small intestinal bacterial overgrowth (SIBO) occurs when there is an abnormal increase in the overall bacterial population in the small intestine -- particularly types of bacteria not commonly found in that part of the digestive  tract. Small intestinal bacterial overgrowth (SIBO) commonly results when a circumstance -- such as surgery or disease -- slows the passage of food and waste products in the digestive tract, creating a breeding ground for bacteria.  Signs and symptoms of SIBO often include: Loss of appetite Abdominal pain Nausea Bloating An uncomfortable feeling of fullness after eating Diarrhea or constipation, depending on the type of gas produced  What foods trigger SIBO? While foods arent the original cause of SIBO, certain foods do encourage the overgrowth of the wrong bacteria in your small intestine. If youre feeding them their favorite foods, theyre going to grow more, and that will trigger more of your SIBO symptoms. By the same token, you can help reduce the overgrowth by starving the problematic bacteria of their favorite foods. This strategy has led to a number of proposed SIBO eating plans. The plans vary, and so do individual results. But in general, they tend to recommend limiting carbohydrates.  These include: Sugars and sweeteners. Fruits and starchy vegetables. Dairy products. Grains.  There is a test for this we can do called a breath test, if you are positive we will treat you with an antibiotic to see if it helps.  Your symptoms are very suspicious for this condition, as discussed, we will start you on an antibiotic to see if this helps.

## 2024-07-05 LAB — TISSUE TRANSGLUTAMINASE, IGA: (tTG) Ab, IgA: <1 U/mL

## 2024-07-05 LAB — IGA: Immunoglobulin A: 295 mg/dL (ref 70–320)

## 2024-07-07 ENCOUNTER — Ambulatory Visit: Payer: Self-pay | Admitting: Physician Assistant

## 2024-07-08 ENCOUNTER — Other Ambulatory Visit

## 2024-07-08 DIAGNOSIS — R197 Diarrhea, unspecified: Secondary | ICD-10-CM

## 2024-07-09 LAB — C. DIFFICILE GDH AND TOXIN A/B
GDH ANTIGEN: NOT DETECTED
MICRO NUMBER:: 17392038
SPECIMEN QUALITY:: ADEQUATE
TOXIN A AND B: NOT DETECTED

## 2024-07-14 ENCOUNTER — Other Ambulatory Visit: Payer: Self-pay | Admitting: Family

## 2024-07-14 DIAGNOSIS — I255 Ischemic cardiomyopathy: Secondary | ICD-10-CM

## 2024-07-23 ENCOUNTER — Encounter: Payer: Self-pay | Admitting: Internal Medicine

## 2024-07-26 ENCOUNTER — Ambulatory Visit

## 2024-07-28 ENCOUNTER — Encounter

## 2024-07-29 LAB — CUP PACEART REMOTE DEVICE CHECK
Battery Impedance: 4022 Ohm
Battery Remaining Longevity: 13 mo
Battery Voltage: 2.69 V
Brady Statistic AP VP Percent: 62 %
Brady Statistic AP VS Percent: 0 %
Brady Statistic AS VP Percent: 38 %
Brady Statistic AS VS Percent: 0 %
Date Time Interrogation Session: 20260112110813
Implantable Lead Connection Status: 753985
Implantable Lead Connection Status: 753985
Implantable Lead Implant Date: 20040907
Implantable Lead Implant Date: 20040907
Implantable Lead Location: 753859
Implantable Lead Location: 753860
Implantable Lead Model: 5076
Implantable Lead Model: 5076
Implantable Pulse Generator Implant Date: 20130607
Lead Channel Impedance Value: 455 Ohm
Lead Channel Impedance Value: 636 Ohm
Lead Channel Pacing Threshold Amplitude: 0.625 V
Lead Channel Pacing Threshold Amplitude: 0.625 V
Lead Channel Pacing Threshold Pulse Width: 0.4 ms
Lead Channel Pacing Threshold Pulse Width: 0.4 ms
Lead Channel Setting Pacing Amplitude: 2 V
Lead Channel Setting Pacing Amplitude: 2.5 V
Lead Channel Setting Pacing Pulse Width: 0.4 ms
Lead Channel Setting Sensing Sensitivity: 2 mV
Zone Setting Status: 755011
Zone Setting Status: 755011

## 2024-07-30 ENCOUNTER — Ambulatory Visit (AMBULATORY_SURGERY_CENTER): Admitting: Internal Medicine

## 2024-07-30 ENCOUNTER — Encounter: Payer: Self-pay | Admitting: Internal Medicine

## 2024-07-30 VITALS — BP 147/39 | HR 68 | Temp 97.0°F | Resp 18 | Ht 69.0 in | Wt 142.0 lb

## 2024-07-30 DIAGNOSIS — R197 Diarrhea, unspecified: Secondary | ICD-10-CM | POA: Diagnosis not present

## 2024-07-30 MED ORDER — SODIUM CHLORIDE 0.9 % IV SOLN: 500.0000 mL | Freq: Once | INTRAVENOUS | Status: DC

## 2024-07-30 NOTE — Patient Instructions (Addendum)
 Normal colonoscopy again!  Stay off the supplement and I think you will do well.  Make sure you are getting enough protein in your diet and try to do resistance exercises.  This should help maintain your weight. I have attached some protein info.  I appreciate the opportunity to care for you. Lupita CHARLENA Commander, MD, FACG   YOU HAD AN ENDOSCOPIC PROCEDURE TODAY AT THE [facility]:   Refer to the procedure report that was given to you for any specific questions about what was found during the examination.  If the procedure report does not answer your questions, please call your gastroenterologist to clarify.  If you requested that your care partner not be given the details of your procedure findings, then the procedure report has been included in a sealed envelope for you to review at your convenience later.  YOU SHOULD EXPECT: Some feelings of bloating in the abdomen. Passage of more gas than usual.  Walking can help get rid of the air that was put into your GI tract during the procedure and reduce the bloating. If you had a lower endoscopy (such as a colonoscopy or flexible sigmoidoscopy) you may notice spotting of blood in your stool or on the toilet paper. If you underwent a bowel prep for your procedure, you may not have a normal bowel movement for a few days.  Please Note:  You might notice some irritation and congestion in your nose or some drainage.  This is from the oxygen used during your procedure.  There is no need for concern and it should clear up in a day or so.  SYMPTOMS TO REPORT IMMEDIATELY:  Following lower endoscopy (colonoscopy or flexible sigmoidoscopy):  Excessive amounts of blood in the stool  Significant tenderness or worsening of abdominal pains  Swelling of the abdomen that is new, acute  Fever of 100F or higher   For urgent or emergent issues, a gastroenterologist can be reached at any hour by calling [phone]. Do not use MyChart messaging for  urgent concerns.    DIET:  We do recommend a small meal at first, but then you may proceed to your regular diet.  Drink plenty of fluids but you should avoid alcoholic beverages for 24 hours.  ACTIVITY:  You should plan to take it easy for the rest of today and you should NOT DRIVE or use heavy machinery until tomorrow (because of the sedation medicines used during the test).    FOLLOW UP: Our staff will call the number listed on your records the next business day following your procedure.  We will call around 7:15- 8:00 am to check on you and address any questions or concerns that you may have regarding the information given to you following your procedure. If we do not reach you, we will leave a message.     If any biopsies were taken you will be contacted by phone or by letter within the next 1-3 weeks.  Please call us  at [phone] if you have not heard about the biopsies in 3 weeks.    SIGNATURES/CONFIDENTIALITY: You and/or your care partner have signed paperwork which will be entered into your electronic medical record.  These signatures attest to the fact that that the information above on your After Visit Summary has been reviewed and is understood.  Full responsibility of the confidentiality of this discharge information lies with you and/or your care-partner.

## 2024-07-30 NOTE — Op Note (Signed)
 [facility] Patient Name: Madeline Schaefer Procedure Date: 07/30/2024 10:15 AM MRN: [id number] Endoscopist: Lupita FORBES Commander , MD, [id number] Age: 79 Referring MD:  Date of Birth: 1945-09-28 Gender: Female Account #: [id number] Procedure:                Colonoscopy Indications:              Diarrhea Medicines:                Monitored Anesthesia Care Procedure:                Pre-Anesthesia Assessment:                           - Prior to the procedure, a History and Physical                            was performed, and patient medications and                            allergies were reviewed. The patient's tolerance of                            previous anesthesia was also reviewed. The risks                            and benefits of the procedure and the sedation                            options and risks were discussed with the patient.                            All questions were answered, and informed consent                            was obtained. Prior Anticoagulants: The patient has                            taken no anticoagulant or antiplatelet agents. ASA                            Grade Assessment: III - A patient with severe                            systemic disease. After reviewing the risks and                            benefits, the patient was deemed in satisfactory                            condition to undergo the procedure.                           After obtaining informed consent, the colonoscope  was passed under direct vision. Throughout the                            procedure, the patient's blood pressure, pulse, and                            oxygen saturations were monitored continuously. The                            Olympus Scope [id number] was introduced through the                            anus and advanced to the the terminal ileum, with                            identification of the appendiceal orifice  and IC                            valve. The colonoscopy was performed without                            difficulty. The patient tolerated the procedure                            well. The quality of the bowel preparation was                            good. The terminal ileum, ileocecal valve,                            appendiceal orifice, and rectum were photographed.                            The bowel preparation used was SUPREP via split                            dose instruction. Scope In: 11:15:37 AM Scope Out: 11:29:32 AM Scope Withdrawal Time: 0 hours 8 minutes 1 second  Total Procedure Duration: 0 hours 13 minutes 55 seconds  Findings:                 The perianal and digital rectal examinations were                            normal.                           The terminal ileum appeared normal.                           The entire examined colon appeared normal on direct                            and retroflexion views. Complications:            No immediate complications.  Estimated Blood Loss:     Estimated blood loss: none. Impression:               - The examined portion of the ileum was normal.                           - The entire examined colon is normal on direct and                            retroflexion views.                           - No specimens collected. Recommendation:           - Patient has a contact number available for                            emergencies. The signs and symptoms of potential                            delayed complications were discussed with the                            patient. Return to normal activities tomorrow.                            Written discharge instructions were provided to the                            patient.                           - Resume previous diet.                           - Continue present medications but stay off lithium                            oriotate which was the cause of diarrhea.                            - No repeat colonoscopy due to age. Lupita FORBES Commander, MD 07/30/2024 11:39:31 AM This report has been signed electronically.

## 2024-07-30 NOTE — Progress Notes (Signed)
 Vss nad trans to pacu

## 2024-07-30 NOTE — Progress Notes (Signed)
 Pt's states no medical or surgical changes since previsit or office visit.

## 2024-07-30 NOTE — Progress Notes (Signed)
 History and Physical Interval Note:  07/30/2024 11:06 AM  Madeline Schaefer  has presented today for endoscopic procedure(s), with the diagnosis of  Encounter Diagnosis  Name Primary?   Diarrhea, unspecified type Yes  .  The various methods of evaluation and treatment have been discussed with the patient and/or family. After consideration of risks, benefits and other options for treatment, the patient has consented to  the endoscopic procedure(s).   The patient's history has been reviewed, patient examined, no change in status, stable for endoscopic procedure(s).  I have reviewed the patient's chart and labs.  Questions were answered to the patient's satisfaction.     Lupita CHARLENA Commander, MD, NOLIA

## 2024-07-31 ENCOUNTER — Telehealth: Payer: Self-pay

## 2024-07-31 NOTE — Telephone Encounter (Signed)
 No answer, left message to call if having any issues or concerns, B.Vester Titsworth RN

## 2024-08-03 ENCOUNTER — Ambulatory Visit: Payer: Self-pay | Admitting: Cardiology

## 2024-08-04 ENCOUNTER — Ambulatory Visit: Admitting: Family

## 2024-08-04 ENCOUNTER — Encounter: Payer: Self-pay | Admitting: Family

## 2024-08-04 VITALS — BP 136/72 | HR 82 | Temp 98.7°F | Ht 69.0 in | Wt 142.0 lb

## 2024-08-04 DIAGNOSIS — R7303 Prediabetes: Secondary | ICD-10-CM | POA: Diagnosis not present

## 2024-08-04 DIAGNOSIS — F419 Anxiety disorder, unspecified: Secondary | ICD-10-CM

## 2024-08-04 DIAGNOSIS — E559 Vitamin D deficiency, unspecified: Secondary | ICD-10-CM | POA: Diagnosis not present

## 2024-08-04 NOTE — Progress Notes (Unsigned)
 "  Assessment & Plan:  Prediabetes Assessment & Plan: Counseled on low glycemic diet.  Weight has stabilized.  We discussed 12 lb weight loss over the last approx 12 months likely due to increased exercise, healthier food choices.  We discussed close vigilance as a relates to weight to ensure no further weight loss or unintentional weight loss   Anxiety  Vitamin D  deficiency Assessment & Plan: Has vitamin D  deficiency.  Advised 800 units Vit D every day; will recheck level at follow up.      Return precautions given.   Risks, benefits, and alternatives of the medications and treatment plan prescribed today were discussed, and patient expressed understanding.   Education regarding symptom management and diagnosis given to patient on AVS either electronically or printed.  Return in about 6 weeks (around 09/15/2024).  Madeline Northern, FNP  Subjective:    Patient ID: Madeline Schaefer, female    DOB: 1946-04-10, 79 y.o.   MRN: [id number]  CC: Madeline Schaefer is a 79 y.o. female who presents today for follow up.   HPI: HPI Discussed the use of AI scribe software for clinical note transcription with the patient, who gave verbal consent to proceed.  History of Present Illness   Madeline Schaefer is a 79 year old female who presents with concerns about recent lab results showing abnormalities in vitamin D  levels and blood sugar. She is accompanied by her husband.   She manages her diet by reducing sugar and carbohydrate intake and exercises regularly, attending classes three times a week and walking her dog frequently.  She reports a weight loss from 155 lbs in August 2024 to 142 lbs currently. She notes that she does not eat as many sweets or as much food as she used to and that she exercises regularly. She feels her weight has stabilized around 140 lbs.   Diarrhea has resolved.  Denies, fever, chills, abdominal pain         Consult with GI for diarrhea 07/04/24  New  supplement lithium- asked to stop.  Stool samples.  ESR.  Discussed FODMAP. Discussed endoscopy. Status post endoscopy, colonoscopy 07/30/2024, normal. NO repeat d/t age   Allergies: Bee venom, Codeine, Pneumovax 23 [pneumococcal vac polyvalent], Hornet venom, Lisinopril , Sulfa antibiotics, and Sulfonamide derivatives Medications Ordered Prior to Encounter[1]  Review of Systems  Constitutional:  Negative for chills and fever.  Respiratory:  Negative for cough.   Cardiovascular:  Negative for chest pain and palpitations.  Gastrointestinal:  Negative for diarrhea, nausea and vomiting.      Objective:    BP 136/72   Pulse 82   Temp 98.7 F (37.1 C) (Oral)   Ht 5' 9 (1.753 m)   Wt 142 lb (64.4 kg)   LMP 10/17/1986   SpO2 98%   BMI 20.97 kg/m  BP Readings from Last 3 Encounters:  08/04/24 136/72  07/30/24 (!) 147/39  07/04/24 122/70   Wt Readings from Last 3 Encounters:  08/04/24 142 lb (64.4 kg)  07/30/24 142 lb (64.4 kg)  07/04/24 142 lb (64.4 kg)  155 lb 02/20/23 151 lb 07/05/23 Physical Exam Vitals reviewed.  Constitutional:      Appearance: She is well-developed.  Eyes:     Conjunctiva/sclera: Conjunctivae normal.  Cardiovascular:     Rate and Rhythm: Normal rate and regular rhythm.     Pulses: Normal pulses.     Heart sounds: Normal heart sounds.  Pulmonary:     Effort: Pulmonary effort is normal.  Breath sounds: Normal breath sounds. No wheezing, rhonchi or rales.  Skin:    General: Skin is warm and dry.  Neurological:     Mental Status: She is alert.  Psychiatric:        Speech: Speech normal.        Behavior: Behavior normal.        Thought Content: Thought content normal.            [1]  Current Outpatient Medications on File Prior to Visit  Medication Sig Dispense Refill   aspirin EC 81 MG tablet Take 81 mg by mouth daily.     atorvastatin  (LIPITOR) 80 MG tablet Take 1 tablet by mouth once daily 90 tablet 0   busPIRone  (BUSPAR ) 5 MG tablet  Take 1 tablet (5 mg total) by mouth 2 (two) times daily. As needed for anxiety 60 tablet 0   carvedilol  (COREG ) 3.125 MG tablet TAKE 1 TABLET BY MOUTH TWICE DAILY WITH A MEAL 180 tablet 0   clobetasol ointment (TEMOVATE) 0.05 % Apply 1 application topically daily as needed (eczema).      clotrimazole -betamethasone  (LOTRISONE ) cream Apply 1 application topically 2 (two) times daily. 30 g 0   EPINEPHrine  0.3 mg/0.3 mL IJ SOAJ injection Inject 0.3 mLs (0.3 mg total) into the muscle once. 2 Device 1   fexofenadine (ALLEGRA) 180 MG tablet      ibuprofen  (ADVIL ) 200 MG tablet Take 2 tablets (400 mg total) by mouth every 8 (eight) hours as needed for moderate pain (with food). 30 tablet 0   levothyroxine  (SYNTHROID ) 88 MCG tablet Take 1 tablet (88 mcg total) by mouth daily. 90 tablet 3   losartan  (COZAAR ) 25 MG tablet TAKE 1 TABLET BY MOUTH ONCE DAILY (TAKE AN ADDITIONAL ONE-HALF TABLET IF BP IS GREATER THAN 140/80) 90 tablet 0   No current facility-administered medications on file prior to visit.   "

## 2024-08-04 NOTE — Patient Instructions (Addendum)
 " Consider downloading app on your phone, MyFitness pal to log exercise and calories so we can monitor weight closely.    Vitamin D  is slightly low. Please start cholecalciferol 800 units daily. You may find this over the counter in the drug store.   Please call to arrange follow up with me a few months and request vitamin d  to be drawn then.   Please ensure you are following a diet high in calcium  -- research shows better outcomes with dietary sources including kale, yogurt, broccolii, cheese, okra, almonds- to name a few.    Your lab work reflects that you are in the pre-diabetes range, if a1c is between 5.7 to 6.4%. A normal fasting blood glucose is less than 100.   It is very important to maintain an exercise program and limit processed, sugary foods. If at any point you would a referral a nutritionist, please let me know.      This is  Dr. Lula  example of a  Low GI  Diet:  It will allow you to lose 4 to 8  lbs  per month if you follow it carefully.  Your goal with exercise is a minimum of 30 minutes of aerobic exercise 5 days per week (Walking does not count once it becomes easy!)    All of the foods can be found at grocery stores and in bulk at [employer].  The Atkins protein bars and shakes are available in more varieties at Target, [employer] and [employer]'s Foods.     7 AM Breakfast:  Choose from the following:  Low carbohydrate Protein  Shakes (I recommend the  Premier Protein chocolate shakes,  EAS AdvantEdge Carb Control shakes  Or the Atkins shakes all are under 3 net carbs)     a scrambled egg/bacon/cheese burrito made with Mission's carb balance whole wheat tortilla  (about 10 net carbs )  Medical Laboratory Scientific Officer (basically a quiche without the pastry crust) that is eaten cold and very convenient way to get your eggs.  8 carbs)  If you make your own protein shakes, avoid bananas and pineapple,  And use low carb greek yogurt or original /unsweetened almond or soy  milk    Avoid cereal and bananas, oatmeal and cream of wheat and grits. They are loaded with carbohydrates!   10 AM: high protein snack:  Protein bar by Atkins (the snack size, under 200 cal, usually < 6 net carbs).    A stick of cheese:  Around 1 carb,  100 cal     Dannon Light n Fit Greek Yogurt  (80 cal, 8 carbs)  Other so called protein bars and Greek yogurts tend to be loaded with carbohydrates.  Remember, in food advertising, the word energy is synonymous for  carbohydrate.  Lunch:   A Sandwich using the bread choices listed, Can use any  Eggs,  lunchmeat, grilled meat or canned tuna), avocado, regular mayo/mustard  and cheese.  A Salad using blue cheese, ranch,  Goddess or vinagrette,  Avoid taco shells, croutons or confetti and no candied nuts but regular nuts OK.   No pretzels, nabs  or chips.  Pickles and miniature sweet peppers are a good low carb alternative that provide a crunch  The bread is the only source of carbohydrate in a sandwich and  can be decreased by trying some of the attached alternatives to traditional loaf bread   Avoid Low fat dressings, as well as Actor and [employer] dressings  They are loaded with sugar!   3 PM/ Mid day  Snack:  Consider  1 ounce of  almonds, walnuts, pistachios, pecans, peanuts,  Macadamia nuts or a nut medley.  Avoid granola and granola bars   Mixed nuts are ok in moderation as long as there are no raisins,  cranberries or dried fruit.   KIND bars are OK if you get the low glycemic index variety   Try the prosciutto/mozzarella cheese sticks by [employer]  In deli /backery section   High protein      6 PM  Dinner:     Meat/fowl/fish with a green salad, and either broccoli, cauliflower, green beans, spinach, brussel sprouts or  Lima beans. DO NOT BREAD THE PROTEIN!!      There is a low carb pasta by Dreamfield's that is acceptable and tastes great: only 5 digestible carbs/serving.( All grocery stores but BJs carry it  ) Several ready made meals are available low carb:   Try Michel Angelo's chicken piccata or chicken or eggplant parm over low carb pasta.(Lowes and BJs)   [place] Carnitas (pulled pork, no sauce,  0 carbs) or his beef pot roast to make a dinner burrito (at [employer])  Pesto over low carb pasta (bj's sells a good quality pesto in the center refrigerated section of the deli   Try satueeing  Glenna Blonder with mushroooms as a good side   [employer] Giant makes a mashed cauliflower that tastes like mashed potatoes  Whole wheat pasta is still full of digestible carbs and  Not as low in glycemic index as Dreamfield's.   Brown rice is still rice,  So skip the rice and noodles if you eat Chinese or Thai (or at least limit to 1/2 cup)  9 PM snack :   Breyer's low carb fudgsicle or  ice cream bar (Carb Smart line), or  Weight Watcher's ice cream bar , or another no sugar added ice cream;  a serving of fresh berries/cherries with whipped cream   Cheese or DANNON'S LlGHT N FIT GREEK YOGURT  8 ounces of [place] unsweetened almond/cococunut milk    Treat yourself to a parfait made with whipped cream blueberiies, walnuts and vanilla greek yogurt  Avoid bananas, pineapple, grapes  and watermelon on a regular basis because they are high in sugar.  THINK OF THEM AS DESSERT  Remember that snack Substitutions should be less than 10 NET carbs per serving and meals < 20 carbs. Remember to subtract fiber grams to get the net carbs.    "

## 2024-08-05 DIAGNOSIS — E559 Vitamin D deficiency, unspecified: Secondary | ICD-10-CM | POA: Insufficient documentation

## 2024-08-05 NOTE — Assessment & Plan Note (Addendum)
 Counseled on low glycemic diet.  Weight has stabilized.  We discussed 12 lb weight loss over the last approx 12 months likely due to increased exercise, healthier food choices.  We discussed close vigilance as a relates to weight to ensure no further weight loss or unintentional weight loss

## 2024-08-05 NOTE — Assessment & Plan Note (Signed)
 Has vitamin D  deficiency.  Advised 800 units Vit D every day; will recheck level at follow up.

## 2024-08-20 NOTE — Progress Notes (Unsigned)
 " Cardiology Office Note:   Date:  08/21/2024  ID:  Madeline Schaefer, DOB 03/20/1946, MRN [id number] PCP: Dineen Rollene MATSU, FNP  [facility] Providers Cardiologist:  Lynwood Schilling, MD Electrophysiologist:  OLE ONEIDA HOLTS, MD (Inactive) {  History of Present Illness:   Madeline Schaefer is a 79 y.o. female who presents for follow up of CAD.   Previously She has a mildly reduced EF and moderate AI.   She has a distant history of stenting to her LAD. In  2011 she had an EF was 40 - 45%. She has a pacemaker.  She was having some dypsnea.    She had a PET scan in 2023.  This demonstrated a small fixed defect with no ischemia .  The EF was 61%.      Since I last saw her she has done well.  The patient denies any new symptoms such as chest discomfort, neck or arm discomfort. There has been no new shortness of breath, PND or orthopnea. There have been no reported palpitations, presyncope or syncope.     Today she made me agree to take her dog Mollie a doodle if something happens to her and her husband.  She worries a lot about this dog.     ROS: As stated in the HPI and negative for all other systems.  Studies Reviewed:    EKG:   EKG Interpretation Date/Time:  Thursday August 21 2024 10:14:32 EST Ventricular Rate:  84 PR Interval:  200 QRS Duration:  176 QT Interval:  438 QTC Calculation: 517 R Axis:   -85  Text Interpretation: AV dual-paced rhythm When compared with ECG of 28-Jun-2023 10:02, Vent. rate has increased BY  19 BPM Confirmed by Schilling Rattan ([id number]) on 08/21/2024 10:38:42 AM     Risk Assessment/Calculations:     Physical Exam:   VS:  BP (!) 146/76 (BP Location: Right Arm, Patient Position: Sitting, Cuff Size: Normal)   Pulse 76   Resp 16   Ht 5' 9 (1.753 m)   Wt 142 lb 14.4 oz (64.8 kg)   LMP 10/17/1986   SpO2 99%   BMI 21.10 kg/m    Wt Readings from Last 3 Encounters:  08/21/24 142 lb 14.4 oz (64.8 kg)  08/04/24 142 lb (64.4 kg)  07/30/24 142 lb  (64.4 kg)     GEN: Well nourished, well developed in no acute distress NECK: No JVD; No carotid bruits CARDIAC: RRR, no murmurs, rubs, gallops RESPIRATORY:  Clear to auscultation without rales, wheezing or rhonchi  ABDOMEN: Soft, non-tender, non-distended EXTREMITIES:  No edema; No deformity   ASSESSMENT AND PLAN:   CAD:   She had a negative perfusion study in 2023.   She has had no new symptoms.  The patient denies any new symptoms such as chest discomfort, neck or arm discomfort. There has been no new shortness of breath, PND or orthopnea. There have been no reported palpitations, presyncope or syncope.   STATUS POST PACEMAKER:   She has 13 months of battery life.  She will follow-up in [place].  HTN:      Her blood pressure is at target at home and she will keep an eye on this.  She has whitecoat hypertension.  DYSLIPIDEMIA:    Her LDL 65 with an HDL of 49.3.  No change in therapy.     REDUCED EF:   EF was 50% in December 2025.  AI was moderate.  MR was moderate.    Follow  up with me in one year.   Signed, Lynwood Schilling, MD   "

## 2024-08-21 ENCOUNTER — Encounter: Payer: Self-pay | Admitting: Cardiology

## 2024-08-21 ENCOUNTER — Ambulatory Visit: Admitting: Cardiology

## 2024-08-21 VITALS — BP 146/76 | HR 76 | Resp 16 | Ht 69.0 in | Wt 142.9 lb

## 2024-08-21 DIAGNOSIS — E785 Hyperlipidemia, unspecified: Secondary | ICD-10-CM | POA: Diagnosis not present

## 2024-08-21 DIAGNOSIS — I10 Essential (primary) hypertension: Secondary | ICD-10-CM | POA: Diagnosis not present

## 2024-08-21 DIAGNOSIS — I2510 Atherosclerotic heart disease of native coronary artery without angina pectoris: Secondary | ICD-10-CM

## 2024-08-21 DIAGNOSIS — I340 Nonrheumatic mitral (valve) insufficiency: Secondary | ICD-10-CM

## 2024-08-21 NOTE — Patient Instructions (Signed)

## 2024-08-22 ENCOUNTER — Other Ambulatory Visit: Payer: Self-pay | Admitting: Family

## 2024-08-22 DIAGNOSIS — F419 Anxiety disorder, unspecified: Secondary | ICD-10-CM

## 2024-08-26 ENCOUNTER — Encounter

## 2024-08-27 ENCOUNTER — Encounter: Admitting: Internal Medicine

## 2024-08-28 ENCOUNTER — Encounter

## 2024-09-15 ENCOUNTER — Ambulatory Visit: Admitting: Family

## 2024-09-16 ENCOUNTER — Encounter

## 2024-09-23 ENCOUNTER — Ambulatory Visit: Admitting: Cardiology

## 2024-09-26 ENCOUNTER — Encounter

## 2024-09-29 ENCOUNTER — Encounter

## 2024-10-27 ENCOUNTER — Encounter

## 2024-10-30 ENCOUNTER — Encounter

## 2024-12-16 ENCOUNTER — Encounter

## 2025-03-17 ENCOUNTER — Encounter

## 2025-06-16 ENCOUNTER — Encounter

## 2025-09-15 ENCOUNTER — Encounter
